# Patient Record
Sex: Male | Born: 1995 | Race: Black or African American | Hispanic: No | Marital: Single | Smoking: Heavy tobacco smoker
Health system: Southern US, Community
[De-identification: ages and names within clinical notes are randomized; demographics above are authoritative.]

## PROBLEM LIST (undated history)

## (undated) ENCOUNTER — Emergency Department (HOSPITAL_COMMUNITY): Admission: EM | Payer: Self-pay | Source: Home / Self Care

## (undated) DIAGNOSIS — R112 Nausea with vomiting, unspecified: Secondary | ICD-10-CM

## (undated) DIAGNOSIS — F19939 Other psychoactive substance use, unspecified with withdrawal, unspecified: Secondary | ICD-10-CM

## (undated) DIAGNOSIS — R569 Unspecified convulsions: Secondary | ICD-10-CM

## (undated) DIAGNOSIS — F1290 Cannabis use, unspecified, uncomplicated: Secondary | ICD-10-CM

## (undated) DIAGNOSIS — R5600 Simple febrile convulsions: Secondary | ICD-10-CM

## (undated) HISTORY — DX: Simple febrile convulsions: R56.00

---
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
## 2015-05-02 ENCOUNTER — Emergency Department (HOSPITAL_COMMUNITY)
Admission: EM | Admit: 2015-05-02 | Discharge: 2015-05-02 | Disposition: A | Discharge status: Home / Self Care | Payer: Medicaid Other | Attending: Emergency Medicine | Admitting: Emergency Medicine

## 2015-05-02 ENCOUNTER — Encounter (HOSPITAL_COMMUNITY): Payer: Self-pay | Admitting: Emergency Medicine

## 2015-05-02 DIAGNOSIS — R197 Diarrhea, unspecified: Secondary | ICD-10-CM | POA: Diagnosis not present

## 2015-05-02 DIAGNOSIS — Z720 Tobacco use: Secondary | ICD-10-CM | POA: Diagnosis not present

## 2015-05-02 DIAGNOSIS — Z79899 Other long term (current) drug therapy: Secondary | ICD-10-CM | POA: Insufficient documentation

## 2015-05-02 DIAGNOSIS — R112 Nausea with vomiting, unspecified: Secondary | ICD-10-CM

## 2015-05-02 LAB — CBC WITH DIFFERENTIAL/PLATELET
Basophils Absolute: 0 10*3/uL (ref 0.0–0.1)
Basophils Relative: 0 % (ref 0–1)
Eosinophils Absolute: 0 10*3/uL (ref 0.0–0.7)
Eosinophils Relative: 0 % (ref 0–5)
HCT: 46 % (ref 39.0–52.0)
Hemoglobin: 15.5 g/dL (ref 13.0–17.0)
Lymphocytes Relative: 13 % (ref 12–46)
Lymphs Abs: 1.5 10*3/uL (ref 0.7–4.0)
MCH: 31.3 pg (ref 26.0–34.0)
MCHC: 33.7 g/dL (ref 30.0–36.0)
MCV: 92.7 fL (ref 78.0–100.0)
Monocytes Absolute: 0.6 10*3/uL (ref 0.1–1.0)
Monocytes Relative: 5 % (ref 3–12)
Neutro Abs: 9 10*3/uL — ABNORMAL HIGH (ref 1.7–7.7)
Neutrophils Relative %: 82 % — ABNORMAL HIGH (ref 43–77)
Platelets: 292 10*3/uL (ref 150–400)
RBC: 4.96 MIL/uL (ref 4.22–5.81)
RDW: 13.4 % (ref 11.5–15.5)
WBC: 11.1 10*3/uL — ABNORMAL HIGH (ref 4.0–10.5)

## 2015-05-02 LAB — BASIC METABOLIC PANEL
Anion gap: 23 — ABNORMAL HIGH (ref 5–15)
BUN: 12 mg/dL (ref 6–20)
CO2: 15 mmol/L — ABNORMAL LOW (ref 22–32)
Calcium: 10.2 mg/dL (ref 8.9–10.3)
Chloride: 99 mmol/L — ABNORMAL LOW (ref 101–111)
Creatinine, Ser: 0.99 mg/dL (ref 0.61–1.24)
GFR calc Af Amer: >60 mL/min (ref >60)
GFR calc non Af Amer: >60 mL/min (ref >60)
Glucose, Bld: 97 mg/dL (ref 65–99)
Potassium: 3.9 mmol/L (ref 3.5–5.1)
Sodium: 137 mmol/L (ref 135–145)

## 2015-05-02 MED ORDER — MORPHINE SULFATE 4 MG/ML IJ SOLN
6.0000 mg | Freq: Once | INTRAMUSCULAR | Status: AC
  Administered 2015-05-02: 6 mg via INTRAVENOUS
  Filled 2015-05-02: qty 2

## 2015-05-02 MED ORDER — ONDANSETRON 8 MG PO TBDP
8.0000 mg | ORAL_TABLET | Freq: Once | ORAL | Status: AC
  Administered 2015-05-02: 8 mg via ORAL
  Filled 2015-05-02: qty 1

## 2015-05-02 MED ORDER — ONDANSETRON HCL 4 MG PO TABS
4.0000 mg | ORAL_TABLET | Freq: Four times a day (QID) | ORAL | Status: DC
Start: 2015-05-02 — End: 2021-08-28

## 2015-05-02 MED ORDER — KETOROLAC TROMETHAMINE 30 MG/ML IJ SOLN
15.0000 mg | Freq: Once | INTRAMUSCULAR | Status: AC
  Administered 2015-05-02: 15 mg via INTRAVENOUS
  Filled 2015-05-02: qty 1

## 2015-05-02 MED ORDER — ONDANSETRON HCL 4 MG/2ML IJ SOLN
4.0000 mg | Freq: Once | INTRAMUSCULAR | Status: AC
Start: 2015-05-02 — End: 2015-05-02
  Administered 2015-05-02: 4 mg via INTRAMUSCULAR
  Filled 2015-05-02: qty 2

## 2015-05-02 MED ORDER — SODIUM CHLORIDE 0.9 % IV BOLUS (SEPSIS)
1000.0000 mL | Freq: Once | INTRAVENOUS | Status: AC
  Administered 2015-05-02: 1000 mL via INTRAVENOUS

## 2015-05-02 NOTE — ED Notes (Signed)
PT c/o n/v/d starting this am with vomiting x10 and diarrhea x4. PT denies any recent antiobiotics and denies any abdominal pain.

## 2015-05-07 NOTE — ED Provider Notes (Signed)
CSN: [id number]     Arrival date & time 05/02/15  1540 History   First MD Initiated Contact with Patient 05/02/15 1706     Chief Complaint  Patient presents with  . Emesis     (Consider location/radiation/quality/duration/timing/severity/associated sxs/prior Treatment) HPI   18ym with n/v/d. Retching when he entered room. Symptoms began this morning. Multiple episodes of v/d.no blood. No fever or chills. Diffuse crampy abdominal pain. No sick contacts. Otherwise pretty healthy. No intervention prior to arrival.   Past Medical History  Diagnosis Date  . Febrile seizures     last at 48 y of age   History reviewed. No pertinent past surgical history. Family History  Problem Relation Age of Onset  . Diabetes Maternal Grandmother    History  Substance Use Topics  . Smoking status: Heavy Tobacco Smoker -- 0.50 packs/day    Types: Cigarettes  . Smokeless tobacco: Not on file  . Alcohol Use: Yes     Comment: last night    Review of Systems  All systems reviewed and negative, other than as noted in HPI.   Allergies  Amoxil  Home Medications   Prior to Admission medications   Medication Sig Start Date End Date Taking? Authorizing Provider  albuterol (PROVENTIL HFA;VENTOLIN HFA) 108 (90 BASE) MCG/ACT inhaler Inhale 2 puffs into the lungs every 6 (six) hours as needed for wheezing.   Yes Historical Provider, MD  loratadine (CLARITIN) 10 MG tablet Take 1 tablet (10 mg total) by mouth daily. Patient not taking: Reported on 05/02/2015 08/07/13   Laurell Josephs, MD  ondansetron (ZOFRAN) 4 MG tablet Take 1 tablet (4 mg total) by mouth every 6 (six) hours. 05/02/15   Raeford Razor, MD  terbinafine (LAMISIL) 250 MG tablet 1 tab PO QD for 12 weeks. Patient not taking: Reported on 05/02/2015 08/15/13   Laurell Josephs, MD   BP 121/85 mmHg  Pulse 88  Temp(Src) 98 F (36.7 C) (Oral)  Resp 20  SpO2 100% Physical Exam  Constitutional: He appears well-developed and well-nourished. No  distress.  HENT:  Head: Normocephalic and atraumatic.  Eyes: Conjunctivae are normal. Right eye exhibits no discharge. Left eye exhibits no discharge.  Neck: Neck supple.  Cardiovascular: Normal rate, regular rhythm and normal heart sounds.  Exam reveals no gallop and no friction rub.   No murmur heard. Pulmonary/Chest: Effort normal and breath sounds normal. No respiratory distress.  Abdominal: Soft. He exhibits no distension. There is no tenderness.  Musculoskeletal: He exhibits no edema or tenderness.  Neurological: He is alert.  Skin: Skin is warm and dry.  Psychiatric: He has a normal mood and affect. His behavior is normal. Thought content normal.  Nursing note and vitals reviewed.   ED Course  Procedures (including critical care time) Labs Review Labs Reviewed  CBC WITH DIFFERENTIAL/PLATELET - Abnormal; Notable for the following:    WBC 11.1 (*)    Neutrophils Relative % 82 (*)    Neutro Abs 9.0 (*)    All other components within normal limits  BASIC METABOLIC PANEL - Abnormal; Notable for the following:    Chloride 99 (*)    CO2 15 (*)    Anion gap 23 (*)    All other components within normal limits    Imaging Review No results found.   EKG Interpretation None      MDM   Final diagnoses:  Nausea vomiting and diarrhea    18yM with n/v/d. Treated symptomatically with much improvement. Now smiling  and drinking sprite. Suspect viral illness. It has been determined that no acute conditions requiring further emergency intervention are present at this time. The patient has been advised of the diagnosis and plan. I reviewed any labs and imaging including any potential incidental findings. We have discussed signs and symptoms that warrant return to the ED and they are listed in the discharge instructions.      Raeford Razor, MD 05/07/15 [phone]

## 2015-11-02 ENCOUNTER — Emergency Department (HOSPITAL_COMMUNITY)
Admission: EM | Admit: 2015-11-02 | Discharge: 2015-11-02 | Disposition: A | Discharge status: Home / Self Care | Payer: Medicaid Other | Attending: Emergency Medicine | Admitting: Emergency Medicine

## 2015-11-02 ENCOUNTER — Encounter (HOSPITAL_COMMUNITY): Payer: Self-pay | Admitting: Emergency Medicine

## 2015-11-02 DIAGNOSIS — J029 Acute pharyngitis, unspecified: Secondary | ICD-10-CM | POA: Insufficient documentation

## 2015-11-02 DIAGNOSIS — Z880 Allergy status to penicillin: Secondary | ICD-10-CM | POA: Insufficient documentation

## 2015-11-02 DIAGNOSIS — Z79899 Other long term (current) drug therapy: Secondary | ICD-10-CM | POA: Insufficient documentation

## 2015-11-02 DIAGNOSIS — J3489 Other specified disorders of nose and nasal sinuses: Secondary | ICD-10-CM | POA: Insufficient documentation

## 2015-11-02 DIAGNOSIS — F17210 Nicotine dependence, cigarettes, uncomplicated: Secondary | ICD-10-CM | POA: Diagnosis not present

## 2015-11-02 LAB — RAPID STREP SCREEN (MED CTR MEBANE ONLY): Streptococcus, Group A Screen (Direct): NEGATIVE

## 2015-11-02 NOTE — Discharge Instructions (Signed)
°Emergency Department Resource Guide °1) Find a Doctor and Pay Out of Pocket °Although you won't have to find out who is covered by your insurance plan, it is a good idea to ask around and get recommendations. You will then need to call the office and see if the doctor you have chosen will accept you as a new patient and what types of options they offer for patients who are self-pay. Some doctors offer discounts or will set up payment plans for their patients who do not have insurance, but you will need to ask so you aren't surprised when you get to your appointment. ° °2) Contact Your Local Health Department °Not all health departments have doctors that can see patients for sick visits, but many do, so it is worth a call to see if yours does. If you don't know where your local health department is, you can check in your phone book. The CDC also has a tool to help you locate your state's health department, and many state websites also have listings of all of their local health departments. ° °3) Find a Walk-in Clinic °If your illness is not likely to be very severe or complicated, you may want to try a walk in clinic. These are popping up all over the country in pharmacies, drugstores, and shopping centers. They're usually staffed by nurse practitioners or physician assistants that have been trained to treat common illnesses and complaints. They're usually fairly quick and inexpensive. However, if you have serious medical issues or chronic medical problems, these are probably not your best option. ° °No Primary Care Doctor: °- Call [facility] at  832-8000 - they can help you locate a primary care doctor that  accepts your insurance, provides certain services, etc. °- Physician Referral Service- 1-800-533-3463 ° °Chronic Pain Problems: °Organization         Address  Phone   Notes  °[facility]  (336) 297-2271 Patients need to be referred by their primary care doctor.  ° °Medication  Assistance: °Organization         Address  Phone   Notes  °[place] [facility] [address] °[place], [place] [address] (336) 641-8030 --Must be a resident of [place] °-- Must have NO insurance coverage whatsoever (no Medicaid/ Medicare, etc.) °-- The pt. MUST have a primary care doctor that directs their care regularly and follows them in the community °  °[facility]  (866) 331-1348   °[employer]  (888) 892-1162   ° °Agencies that provide inexpensive medical care: °Organization         Address  Phone   Notes  °[facility]  (336) 832-8035   °[facility]    (336) 832-7272   °[facility] [address] °[place], [place] [address] (336) 832-4777   °[facility] [address], °[place] (336) 271-4999   °[facility]    (336) 373-0678   °[facility]    (336) 272-1050   °[facility] ° [address], [place] Phone:  (336) 832-4444, Fax:  (336) 832-4440 Hours of Operation:  9 am - 6 pm, M-F.  Also accepts Medicaid/Medicare and self-pay.  °[facility] ° [address], [place] Phone: (336) 832-3150, Fax: (336) 832-3151. Hours of Operation:  8:30 am - 5:30 pm, M-F.  Also accepts Medicaid and self-pay.  °[facility] [place] [address], [place] Phone: (336) 878-6027   °[facility] [address], [place], [place] (336)723-1848, Ext. 123 Mondays & Thursdays: 7-9 AM.  First 15 patients are seen on a first come, first serve basis. °  ° °Medicaid-accepting [place] Providers: ° °Organization         Address  Phone   Notes  °[facility] [address], [place] (336) 641-2100 Also accepts self-pay patients.  °[facility] [address], [place] ° (336) 856-9996   °[facility] [address], [place]  (336) 288-8857   °[facility] [address] [place] [address], [place] (336) 299-7000   °Veita Bland [address], [place]  ° (336) 373-1557 Only accepts [place] Access Medicaid patients after they have their name applied to their card.  ° °Self-Pay (no insurance) in [place]: ° °Organization         Address  Phone   Notes  °Sickle Cell Patients, [facility] [address], [place] (336) 832-1970   °[facility] [address], [place] (336) 832-4400   °[facility] ° [address], [place] (336) 992-4800   °[facility]/Dr. Osei-Bonsu ° [address] [place] [address], [place] or [address], [place] (336) 841-8500 Phone number for both [place] and [place] locations is the same.  °[facility] [address], [place] (336) 299-0000   °[facility] [address] [place] [address], [place] or [address] (336) 852-7530 °(336) 878-2260   °[facility] [address], [place] (336) 350-1642, phone; (336) 294-5005, fax Sees patients 1st and 3rd Saturday of every month.  Must not qualify for public or private insurance (i.e. Medicaid, Medicare, [place] Health Choice, Veterans' Benefits) • Household income should be no more than 200% of the poverty level •The clinic cannot treat you if you are pregnant or think you are pregnant • Sexually transmitted diseases are not treated at the clinic.  ° ° °Dental Care: °Organization         Address  Phone  Notes  °[place] [facility] [address], [place] (336) 641-6152 Accepts children up to age 21 who are enrolled in Medicaid or [place] Health Choice; pregnant women with a Medicaid card; and children who have applied for Medicaid or [place] Health Choice, but were declined, whose parents can pay a reduced fee at time of service.  °[place]  [facility] [place]  [address], [place] (336) 641-7733 Accepts children up to age 21 who are enrolled in Medicaid or [place] Health Choice; pregnant women with a Medicaid card; and children who have applied for Medicaid or [place] Health Choice, but were declined, whose parents can pay a reduced fee at time of service.  °[facility] ° [address], [place] (336) 641-4533 Patients are seen by appointment only. Walk-ins are not accepted. [facility] will see patients 18 years of age and older. °Monday - Tuesday (8am-5pm) °Most Wednesdays (8:30-5pm) °$30 per visit, cash only  °[facility] ° [address], [place] (336) 641-4533 Patients are seen by appointment only. Walk-ins are not accepted. [facility] will see patients 18 years of age and older. °One   Wednesday Evening (Monthly: Volunteer Based).  $30 per visit, cash only  °[facility]  (919) 537-3737 for adults; Children under age 4, call Graduate Pediatric Dentistry at (919) [phone]. Children aged 4-14, please call (919) [phone] to request a pediatric application. ° Dental services are provided in all areas of dental care including fillings, crowns and bridges, complete and partial dentures, implants, gum treatment, root canals, and extractions. Preventive care is also provided. Treatment is provided to both adults and children. °Patients are selected via a lottery and there is often a waiting list. °  °[facility] [address], °[place] ° (336) 763-8833 [email or web address] °  °[facility] [address], [place], [place] (336)723-1848, Ext. 123 Second and Fourth Thursday of each month, opens at 6:30 AM; Clinic ends at 9 AM.  Patients are seen on a first-come first-served basis, and a limited number are seen during each clinic.  ° °[facility] ° [address], [place], [place] (336) 723-7904    Eligibility Requirements °You must have lived in [place], or [place] counties for at least the last three months. °  You cannot be eligible for state or federal sponsored healthcare insurance, including Veterans Administration, Medicaid, or Medicare. °  You generally cannot be eligible for healthcare insurance through your employer.  °  How to apply: °Eligibility screenings are held every Tuesday and Wednesday afternoon from 1:00 pm until 4:00 pm. You do not need an appointment for the interview!  °[facility] [address], [place], [place] 336-631-2330   °[facility]  336-342-8273   °[facility]  336-703-3100   °[facility]  336-570-6415   ° °Behavioral Health Resources in the Community: °Intensive Outpatient Programs °Organization         Address  Phone  Notes  °[facility] [address], [place], [place] 336-878-6098   °[facility] [address], [place], [place] 336-832-9800   °[facility] [address], [place], [place] ° 336-882-2125   °[facility] [address],  °[place], [place] 1-800-853-5163 or 336-641-4981   °Substance Abuse Resources °Organization         Address  Phone  Notes  °[facility]  336-882-2125   °[facility]  336-784-9470   °[facility]  336-285-9073   °[facility]  336-845-3988   °Residential & Outpatient Substance Abuse Program  1-800-659-3381   °Psychological Services °Organization         Address  Phone  Notes  °[facility]  336- 832-9600   °[facility]  336- 378-7881   °[facility] [address], [place] 1-800-853-5163 or 336-641-4981   ° °Mobile Crisis Teams °Organization         Address  Phone  Notes  °[facility], Mobile Crisis Care Unit  1-877-626-1772   °Assertive °[facility] ° [address]  [place], [place] 336-834-9664   °Sharon DeEsch [address] °[place] 336-554-5454   ° °Self-Help/Support Groups °Organization         Address  Phone             Notes  °[facility]  - variety of support groups  336- 373-1402 Call for more information  °Narcotics Anonymous (NA), [facility] [address], °[place]  2 meetings at this location  ° °  Residential Treatment Programs °Organization         Address  Phone  Notes  °[facility] [address],    °[place]  1-866-801-8205   °[facility] ° [address], [place], [place] 704-293-8524   °[facility] [address], [place] 336-845-3988 Admissions: 8am-3pm M-F  °[facility] [address]    °[place], [place] 336-841-1104   °[facility] [address], [place], [place] 336-379-7146   °[facility] [address]  °[place], [place] 336-285-9073   °[facility] - Intensive Outpatient [address], [place], [place] 336-852-3033   °[facility] [address]  °[place], [place] 1-877-615-2722 or 336-784-9470   °[facility] [address] [place], [place] 336-227-7417 Accepts Medicaid  °[facility] [address]  °[place] 1-800-659-3381 Substance Abuse/Addiction Treatment  ° °[place] Behavioral Health Resources °Organization         Address  Phone  Notes  °[facility]  (888) 581-9988   °Julie Brannon, PhD [address] [place], [place]   (336) 349-5553 or (336) 951-0000   °[facility]   [address] °[place], [place] (336) 349-4454   °[facility] [address], [place], [place] (336) 342-8316 Insurance/Medicaid/sponsorship through [facility]  °[facility] [address]                                    [place], [place] (336) 342-8316 Therapy/tele-psych/case    °[facility] [address]  ° [place], [place] (336) 349-2233    °Dr. Arfeen  (336) 349-4544   °[facility] [place]  [employer] [place] [facility] 1) [address], [place] °2) [address], [place] °3)  [address], [place] (336) 349-3220 °(336) 342-7768 ° °(336) 342-8140   °[place] [facility] (336) 342-1394 or (336) 342-3537 (After Hours)    ° ° °Take over the counter tylenol and ibuprofen, as directed on packaging, as needed for discomfort.  Gargle with warm water several times per day to help with discomfort.  May also use over the counter sore throat pain medicines such as chloraseptic or sucrets, as directed on packaging, as needed for discomfort.  Call your regular medical doctor today to schedule a follow up appointment this week.  Return to the Emergency Department immediately if worsening. ° °

## 2015-11-02 NOTE — ED Provider Notes (Signed)
CSN: [id number]     Arrival date & time 11/02/15  0844 History   First MD Initiated Contact with Patient 11/02/15 [phone]     Chief Complaint  Patient presents with  . Sore Throat     HPI Pt was seen at 0855. Per pt, c/o gradual onset and persistence of constant sore throat that began when he woke up today. Denies fevers, no cough, no CP/SOB, no abd pain, no N/V/D, no rash.    Past Medical History  Diagnosis Date  . 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 No pertinent past surgical history.   Family History  Problem Relation Age of [place]  . Diabetes Maternal Grandmother    Social History  Substance Use Topics  . Smoking status: Heavy Tobacco Smoker -- 1.00 packs/day    Types: Cigarettes  . Smokeless tobacco: None  . Alcohol Use: Yes     Comment: last night-daily use    Review of Systems ROS: Statement: All systems negative except as marked or noted in the HPI; Constitutional: Negative for fever and chills. ; ; Eyes: Negative for eye pain, redness and discharge. ; ; ENMT: Negative for ear pain, hoarseness, nasal congestion, sinus pressure and +sore throat. ; ; Cardiovascular: Negative for chest pain, palpitations, diaphoresis, dyspnea and peripheral edema. ; ; Respiratory: Negative for cough, wheezing and stridor. ; ; Gastrointestinal: Negative for nausea, vomiting, diarrhea, abdominal pain, blood in stool, hematemesis, jaundice and rectal bleeding. . ; ; Genitourinary: Negative for dysuria, flank pain and hematuria. ; ; Musculoskeletal: Negative for back pain and neck pain. Negative for swelling and trauma.; ; Skin: Negative for pruritus, rash, abrasions, blisters, bruising and skin lesion.; ; Neuro: Negative for headache, lightheadedness and neck stiffness. Negative for weakness, altered level of consciousness , altered mental status, extremity weakness, paresthesias, involuntary movement, seizure and syncope.      Allergies  Amoxil  Home Medications    Prior to Admission medications   Medication Sig Start Date End Date Taking? Authorizing Provider  albuterol (PROVENTIL HFA;VENTOLIN HFA) 108 (90 BASE) MCG/ACT inhaler Inhale 2 puffs into the lungs every 6 (six) hours as needed for wheezing.    Historical Provider, MD  loratadine (CLARITIN) 10 MG tablet Take 1 tablet (10 mg total) by mouth daily. Patient not taking: Reported on 05/02/2015 08/07/13   Laurell Josephsalia A Khalifa, MD  ondansetron (ZOFRAN) 4 MG tablet Take 1 tablet (4 mg total) by mouth every 6 (six) hours. 05/02/15   Raeford RazorStephen Kohut, MD  terbinafine (LAMISIL) 250 MG tablet 1 tab PO QD for 12 weeks. Patient not taking: Reported on 05/02/2015 08/15/13   Laurell Josephsalia A Khalifa, MD   BP 121/107 mmHg  Pulse 99  Temp(Src) 99.1 F (37.3 C) (Oral)  Resp 18  Ht 5\' 5"  (1.651 m)  Wt 120 lb (54.432 kg)  BMI 19.97 kg/m2  SpO2 100% Physical Exam  0900: Physical examination:  Nursing notes reviewed; Vital signs and O2 SAT reviewed;  Constitutional: Well developed, Well nourished, Well hydrated, In no acute distress; Head:  Normocephalic, atraumatic; Eyes: EOMI, PERRL, No scleral icterus; ENMT: TM's clear bilat. +edemetous nasal turbinates bilat with clear rhinorrhea. Mouth and pharynx without lesions. No tonsillar exudates. No intra-oral edema. No submandibular or sublingual edema. No hoarse voice, no drooling, no stridor. No pain with manipulation of larynx. No trismus. Mouth and pharynx normal, Mucous membranes moist; Neck: Supple, Full range of motion, No lymphadenopathy; Cardiovascular: Regular rate and rhythm, No  murmur, rub, or gallop; Respiratory: Breath sounds clear & equal bilaterally, No rales, rhonchi, wheezes.  Speaking full sentences with ease, Normal respiratory effort/excursion; Chest: Nontender, Movement normal; Abdomen: Soft, Nontender, Nondistended, Normal bowel sounds; Genitourinary: No CVA tenderness; Extremities: Pulses normal, No tenderness, No edema, No calf edema or asymmetry.; Neuro: AA&Ox3,  Major CN grossly intact.  Speech clear. No gross focal motor or sensory deficits in extremities.; Skin: Color normal, Warm, Dry.   ED Course  Procedures (including critical care time) Labs Review   Imaging Review  I have personally reviewed and evaluated these images and lab results as part of my medical decision-making.   EKG Interpretation None      MDM  MDM Reviewed: previous chart, nursing note and vitals      Results for orders placed or performed during the hospital encounter of 11/02/15  Rapid strep screen  Result Value Ref Range   Streptococcus, Group A Screen (Direct) NEGATIVE NEGATIVE    0940:  Strep negative. Tx symptomatically at this time. Dx and testing d/w pt.  Questions answered.  Verb understanding, agreeable to d/c home with outpt f/u.     Samuel Jester, DO 11/05/15 2348

## 2015-11-02 NOTE — ED Notes (Signed)
Pt states that he woke up this morning with congestion and a sore throat.

## 2015-11-05 LAB — CULTURE, GROUP A STREP: STREP A CULTURE: NEGATIVE

## 2016-03-18 ENCOUNTER — Emergency Department (HOSPITAL_COMMUNITY)
Admission: EM | Admit: 2016-03-18 | Discharge: 2016-03-18 | Disposition: A | Discharge status: Home / Self Care | Payer: Medicaid Other | Attending: Emergency Medicine | Admitting: Emergency Medicine

## 2016-03-18 ENCOUNTER — Encounter (HOSPITAL_COMMUNITY): Payer: Self-pay | Admitting: Emergency Medicine

## 2016-03-18 DIAGNOSIS — F17210 Nicotine dependence, cigarettes, uncomplicated: Secondary | ICD-10-CM | POA: Diagnosis not present

## 2016-03-18 DIAGNOSIS — H00036 Abscess of eyelid left eye, unspecified eyelid: Secondary | ICD-10-CM | POA: Diagnosis not present

## 2016-03-18 DIAGNOSIS — L0201 Cutaneous abscess of face: Secondary | ICD-10-CM

## 2016-03-18 MED ORDER — LIDOCAINE-EPINEPHRINE (PF) 2 %-1:200000 IJ SOLN
10.0000 mL | Freq: Once | INTRAMUSCULAR | Status: AC
  Administered 2016-03-18: 10 mL
  Filled 2016-03-18: qty 20

## 2016-03-18 MED ORDER — DOXYCYCLINE HYCLATE 100 MG PO CAPS: 100.0000 mg | ORAL_CAPSULE | Freq: Two times a day (BID) | ORAL | Status: DC

## 2016-03-18 MED ORDER — POVIDONE-IODINE 10 % EX SOLN
CUTANEOUS | Status: AC
  Filled 2016-03-18: qty 118

## 2016-03-18 NOTE — ED Notes (Signed)
Patient with no complaints at this time. Respirations even and unlabored. Skin warm/dry. Discharge instructions reviewed with patient at this time. Patient given opportunity to voice concerns/ask questions. Patient discharged at this time and left Emergency Department with steady gait.   

## 2016-03-18 NOTE — Discharge Instructions (Signed)
Please cleanse the wound daily with soap and water, and apply a Band-Aid. Please use doxycycline 2 times daily with food. May use Tylenol or ibuprofen for soreness if needed. Incision and Drainage Incision and drainage is a procedure in which a sac-like structure (cystic structure) is opened and drained. The area to be drained usually contains material such as pus, fluid, or blood.  LET YOUR CAREGIVER KNOW ABOUT:   Allergies to medicine.  Medicines taken, including vitamins, herbs, eyedrops, over-the-counter medicines, and creams.  Use of steroids (by mouth or creams).  Previous problems with anesthetics or numbing medicines.  History of bleeding problems or blood clots.  Previous surgery.  Other health problems, including diabetes and kidney problems.  Possibility of pregnancy, if this applies. RISKS AND COMPLICATIONS  Pain.  Bleeding.  Scarring.  Infection. BEFORE THE PROCEDURE  You may need to have an ultrasound or other imaging tests to see how large or deep your cystic structure is. Blood tests may also be used to determine if you have an infection or how severe the infection is. You may need to have a tetanus shot. PROCEDURE  The affected area is cleaned with a cleaning fluid. The cyst area will then be numbed with a medicine (local anesthetic). A small incision will be made in the cystic structure. A syringe or catheter may be used to drain the contents of the cystic structure, or the contents may be squeezed out. The area will then be flushed with a cleansing solution. After cleansing the area, it is often gently packed with a gauze or another wound dressing. Once it is packed, it will be covered with gauze and tape or some other type of wound dressing. AFTER THE PROCEDURE   Often, you will be allowed to go home right after the procedure.  You may be given antibiotic medicine to prevent or heal an infection.  If the area was packed with gauze or some other wound  dressing, you will likely need to come back in 1 to 2 days to get it removed.  The area should heal in about 14 days.   This information is not intended to replace advice given to you by your health care provider. Make sure you discuss any questions you have with your health care provider.   Document Released: 05/02/2001 Document Revised: 05/07/2012 Document Reviewed: 01/01/2012 Elsevier Interactive Patient Education [employer]2016 Elsevier Inc.

## 2016-03-18 NOTE — ED Notes (Signed)
Patient c/o abscess to left eyelid. Oribital swelling noted. Per patient appeared on Monday and has increases in size and pain. Denies any blurred vision, drainage, or fevers.

## 2016-03-18 NOTE — ED Provider Notes (Signed)
CSN: [id number]     Arrival date & time 03/18/16  1159 History   First MD Initiated Contact with Patient 03/18/16 1218     Chief Complaint  Patient presents with  . Abscess     (Consider location/radiation/quality/duration/timing/severity/associated sxs/prior Treatment) Patient is a 20 y.o. male presenting with abscess. The history is provided by the patient and a parent.  Abscess Location:  Face Facial abscess location:  L eyebrow Abscess quality: fluctuance, painful and redness   Red streaking: no   Duration:  6 days Progression:  Worsening Pain details:    Quality:  Aching   Severity:  Moderate   Duration:  6 days   Timing:  Intermittent   Progression:  Worsening Chronicity:  New Context: not diabetes   Relieved by:  Nothing Worsened by:  Draining/squeezing Ineffective treatments:  Warm compresses Associated symptoms: no fatigue, no fever, no nausea and no vomiting   Risk factors: no hx of MRSA     Past Medical History  Diagnosis Date  . Febrile seizures (HCC)     last at 89 y of age   History reviewed. No pertinent past surgical history. Family History  Problem Relation Age of Onset  . Diabetes Maternal Grandmother    Social History  Substance Use Topics  . Smoking status: Heavy Tobacco Smoker -- 1.00 packs/day    Types: Cigarettes  . Smokeless tobacco: Never Used  . Alcohol Use: Yes    Review of Systems  Constitutional: Negative for fever, chills, activity change, appetite change and fatigue.  Gastrointestinal: Negative for nausea and vomiting.  Skin: Positive for wound.  All other systems reviewed and are negative.     Allergies  Amoxil  Home Medications   Prior to Admission medications   Medication Sig Start Date End Date Taking? Authorizing Provider  albuterol (PROVENTIL HFA;VENTOLIN HFA) 108 (90 BASE) MCG/ACT inhaler Inhale 2 puffs into the lungs every 6 (six) hours as needed for wheezing.    Historical Provider, MD  loratadine (CLARITIN) 10  MG tablet Take 1 tablet (10 mg total) by mouth daily. Patient not taking: Reported on 05/02/2015 08/07/13   Laurell Josephsalia A Khalifa, MD  ondansetron (ZOFRAN) 4 MG tablet Take 1 tablet (4 mg total) by mouth every 6 (six) hours. 05/02/15   Raeford RazorStephen Kohut, MD  terbinafine (LAMISIL) 250 MG tablet 1 tab PO QD for 12 weeks. Patient not taking: Reported on 05/02/2015 08/15/13   Laurell Josephsalia A Khalifa, MD   BP 123/85 mmHg  Pulse 92  Temp(Src) 97.9 F (36.6 C) (Oral)  Resp 18  Ht 5\' 3"  (1.6 m)  Wt 58.968 kg  BMI 23.03 kg/m2  SpO2 100% Physical Exam  Constitutional: He is oriented to person, place, and time. He appears well-developed and well-nourished.  Non-toxic appearance.  HENT:  Head: Normocephalic.    Right Ear: Tympanic membrane and external ear normal.  Left Ear: Tympanic membrane and external ear normal.  Abscess left eye brow EOM intact bilat. Anterior chamber clear. No periorbital tenderness.  Eyes: EOM and lids are normal. Pupils are equal, round, and reactive to light.  Neck: Normal range of motion. Neck supple. Carotid bruit is not present.  Cardiovascular: Normal rate, regular rhythm, normal heart sounds, intact distal pulses and normal pulses.   Pulmonary/Chest: Breath sounds normal. No respiratory distress.  Abdominal: Soft. Bowel sounds are normal. There is no tenderness. There is no guarding.  Musculoskeletal: Normal range of motion.  Lymphadenopathy:       Head (right side): No submandibular  adenopathy present.       Head (left side): No submandibular adenopathy present.    He has no cervical adenopathy.  Neurological: He is alert and oriented to person, place, and time. He has normal strength. No cranial nerve deficit or sensory deficit.  Skin: Skin is warm and dry.  Psychiatric: He has a normal mood and affect. His speech is normal.  Nursing note and vitals reviewed.   ED Course  .Marland KitchenIncision and Drainage Date/Time: 03/18/2016 1:18 PM Performed by: Ivery Quale Authorized by:  Ivery Quale Consent: Verbal consent obtained. Risks and benefits: risks, benefits and alternatives were discussed Consent given by: patient and parent Patient understanding: patient states understanding of the procedure being performed Patient identity confirmed: arm band Time out: Immediately prior to procedure a "time out" was called to verify the correct patient, procedure, equipment, support staff and site/side marked as required. Type: abscess Body area: head (left eye brow) Anesthesia: local infiltration Local anesthetic: lidocaine 1% with epinephrine Patient sedated: no Needle gauge: 18 Incision depth: dermal Complexity: simple Drainage: purulent Drainage amount: moderate Patient tolerance: Patient tolerated the procedure well with no immediate complications   (including critical care time) Labs Review Labs Reviewed  CULTURE, ROUTINE-ABSCESS    Imaging Review No results found. I have personally reviewed and evaluated these images and lab results as part of my medical decision-making.   EKG Interpretation None      MDM  Vital signs stable. I and d carried out for abscess of the left eye brow. Pt covered with doxycyline. Pt will return if not improving. We discussed the possibllity of scaring, even with using needle instead of scalpel for aspiration or incision and drainage.    Final diagnoses:  Abscess, eyebrow    **I have reviewed nursing notes, vital signs, and all appropriate lab and imaging results for this patient.Ivery Quale, PA-C 03/18/16 1346  Eber Hong, MD 03/19/16 1536

## 2016-03-20 LAB — CULTURE, ROUTINE-ABSCESS

## 2016-08-13 ENCOUNTER — Emergency Department (HOSPITAL_COMMUNITY)
Admission: EM | Admit: 2016-08-13 | Discharge: 2016-08-13 | Disposition: A | Discharge status: Home / Self Care | Payer: Medicaid Other | Attending: Emergency Medicine | Admitting: Emergency Medicine

## 2016-08-13 ENCOUNTER — Encounter (HOSPITAL_COMMUNITY): Payer: Self-pay | Admitting: *Deleted

## 2016-08-13 DIAGNOSIS — F17210 Nicotine dependence, cigarettes, uncomplicated: Secondary | ICD-10-CM | POA: Diagnosis not present

## 2016-08-13 DIAGNOSIS — R350 Frequency of micturition: Secondary | ICD-10-CM | POA: Insufficient documentation

## 2016-08-13 LAB — URINALYSIS, ROUTINE W REFLEX MICROSCOPIC
Bilirubin Urine: NEGATIVE
GLUCOSE, UA: NEGATIVE mg/dL
HGB URINE DIPSTICK: NEGATIVE
Leukocytes, UA: NEGATIVE
Nitrite: NEGATIVE
PH: 6 (ref 5.0–8.0)
PROTEIN: NEGATIVE mg/dL
Specific Gravity, Urine: 1.025 (ref 1.005–1.030)

## 2016-08-13 NOTE — ED Triage Notes (Signed)
Pt c/o increase in urination that started 20 minutes ago,

## 2016-08-13 NOTE — ED Provider Notes (Signed)
[facility]PT Provider Note   CSN: [id number] Arrival date & time: 08/13/16  0359     History   Chief Complaint Chief Complaint  Patient presents with  . Urinary Frequency    HPI Timothy [name] is a 20 y.o. male. He has no significant past medical history. He comes in complaining of urinary frequency this evening. History is rather vague. Initially, he told me that he had onset 20 minutes ago of increased frequency of urination, then stated that it started during the night. He states he felt fine during the day. He denies urinary urgency or tenesmus or dysuria. Denies any urethral discharge. There is no known family history of diabetes. Denies any generalized weakness. Denies nausea or vomiting. He denies fever or chills.  The history is provided by the patient.    Past Medical History:  Diagnosis Date  . Febrile seizures (HCC)    last at 2 y of age    There are no active problems to display for this patient.   History reviewed. No pertinent surgical history.     Home Medications    Prior to Admission medications   Medication Sig Start Date End Date Taking? Authorizing Provider  albuterol (PROVENTIL HFA;VENTOLIN HFA) 108 (90 BASE) MCG/ACT inhaler Inhale 2 puffs into the lungs every 6 (six) hours as needed for wheezing.    Historical Provider, MD  doxycycline (VIBRAMYCIN) 100 MG capsule Take 1 capsule (100 mg total) by mouth 2 (two) times daily. 03/18/16   Ivery QualeHobson Bryant, PA-C  loratadine (CLARITIN) 10 MG tablet Take 1 tablet (10 mg total) by mouth daily. Patient not taking: Reported on 05/02/2015 08/07/13   Laurell Josephsalia A Khalifa, MD  ondansetron (ZOFRAN) 4 MG tablet Take 1 tablet (4 mg total) by mouth every 6 (six) hours. 05/02/15   Raeford RazorStephen Kohut, MD  terbinafine (LAMISIL) 250 MG tablet 1 tab PO QD for 12 weeks. Patient not taking: Reported on 05/02/2015 08/15/13   Laurell Josephsalia A Khalifa, MD    Family History Family History  Problem Relation Age of Onset  . Diabetes Maternal  Grandmother     Social History Social History  Substance Use Topics  . Smoking status: Heavy Tobacco Smoker    Packs/day: 1.00    Types: Cigarettes  . Smokeless tobacco: Never Used  . Alcohol use Yes     Allergies   Amoxil [amoxicillin]   Review of Systems Review of Systems  All other systems reviewed and are negative.    Physical Exam Updated Vital Signs BP 141/85   Pulse 82   Temp 98 F (36.7 C) (Oral)   Resp 16   SpO2 100%   Physical Exam  Nursing note and vitals reviewed.  20 year old male, resting comfortably and in no acute distress. Vital signs are significant for borderline hypertension. Oxygen saturation is 100%, which is normal. Head is normocephalic and atraumatic. PERRLA, EOMI. Oropharynx is clear. Neck is nontender and supple without adenopathy or JVD. Back is nontender and there is no CVA tenderness. Lungs are clear without rales, wheezes, or rhonchi. Chest is nontender. Heart has regular rate and rhythm without murmur. Abdomen is soft, flat, nontender without masses or hepatosplenomegaly and peristalsis is normoactive. Extremities have no cyanosis or edema, full range of motion is present. Skin is warm and dry without rash. Neurologic: Mental status is normal, cranial nerves are intact, there are no motor or sensory deficits.  ED Treatments / Results  Labs (all labs ordered are listed, but only abnormal results  are displayed) Labs Reviewed  URINALYSIS, ROUTINE W REFLEX MICROSCOPIC (NOT AT [facility]) - Abnormal; Notable for the following:       Result Value   Ketones, ur TRACE (*)    All other components within normal limits   Procedures Procedures (including critical care time)  Medications Ordered in ED Medications - No data to display   Initial Impression / Assessment and Plan / ED Course  I have reviewed the triage vital signs and the nursing notes.  Pertinent labs & imaging results that were available during my care of the patient were  reviewed by me and considered in my medical decision making (see chart for details).  Clinical Course   Report of urinary frequency. Urinalysis obese sent to evaluate for possible UTI, possible glycosuria. Old records are reviewed, and he has no relevant past visits.  Urinalysis is normal except for presence of small amount of ketones. No evidence of UTI or diabetes. I suspect that he had increased fluid intake to account for his polyuria although he does deny this. He is sad discharged with instructions to return should symptoms worsen.  Final Clinical Impressions(s) / ED Diagnoses   Final diagnoses:  Urinary frequency    New Prescriptions New Prescriptions   No medications on file     Dione Booze, MD 08/13/16 [phone]

## 2016-12-31 ENCOUNTER — Emergency Department (HOSPITAL_COMMUNITY)
Admission: EM | Admit: 2016-12-31 | Discharge: 2017-01-01 | Disposition: A | Discharge status: Home / Self Care | Payer: Medicaid Other | Attending: Emergency Medicine | Admitting: Emergency Medicine

## 2016-12-31 ENCOUNTER — Encounter (HOSPITAL_COMMUNITY): Payer: Self-pay | Admitting: Emergency Medicine

## 2016-12-31 DIAGNOSIS — R0789 Other chest pain: Secondary | ICD-10-CM

## 2016-12-31 DIAGNOSIS — J069 Acute upper respiratory infection, unspecified: Secondary | ICD-10-CM | POA: Diagnosis not present

## 2016-12-31 DIAGNOSIS — R112 Nausea with vomiting, unspecified: Secondary | ICD-10-CM | POA: Diagnosis not present

## 2016-12-31 DIAGNOSIS — F17210 Nicotine dependence, cigarettes, uncomplicated: Secondary | ICD-10-CM | POA: Insufficient documentation

## 2016-12-31 NOTE — ED Triage Notes (Signed)
Pt states he has been vomiting since 1 am and hasn't been able to keep anything down. States he thinks he "drank too much last night". Pt also c/o intermittent centralized chest pain that he describes as "aching".

## 2017-01-01 ENCOUNTER — Emergency Department (HOSPITAL_COMMUNITY): Payer: Medicaid Other

## 2017-01-01 LAB — CBC WITH DIFFERENTIAL/PLATELET
BASOS PCT: 1 %
Basophils Absolute: 0.1 10*3/uL (ref 0.0–0.1)
EOS ABS: 0.2 10*3/uL (ref 0.0–0.7)
Eosinophils Relative: 4 %
HCT: 45.1 % (ref 39.0–52.0)
Hemoglobin: 15.4 g/dL (ref 13.0–17.0)
Lymphocytes Relative: 22 %
Lymphs Abs: 1.2 10*3/uL (ref 0.7–4.0)
MCH: 31.9 pg (ref 26.0–34.0)
MCHC: 34.1 g/dL (ref 30.0–36.0)
MCV: 93.4 fL (ref 78.0–100.0)
MONOS PCT: 20 %
Monocytes Absolute: 1.1 10*3/uL — ABNORMAL HIGH (ref 0.1–1.0)
NEUTROS PCT: 53 %
Neutro Abs: 2.8 10*3/uL (ref 1.7–7.7)
Platelets: 285 10*3/uL (ref 150–400)
RBC: 4.83 MIL/uL (ref 4.22–5.81)
RDW: 13.4 % (ref 11.5–15.5)
WBC: 5.3 10*3/uL (ref 4.0–10.5)

## 2017-01-01 LAB — COMPREHENSIVE METABOLIC PANEL
ALBUMIN: 4.6 g/dL (ref 3.5–5.0)
ALK PHOS: 107 U/L (ref 38–126)
ALT: 26 U/L (ref 17–63)
ANION GAP: 8 (ref 5–15)
AST: 32 U/L (ref 15–41)
BUN: 7 mg/dL (ref 6–20)
CO2: 27 mmol/L (ref 22–32)
Calcium: 9.5 mg/dL (ref 8.9–10.3)
Chloride: 98 mmol/L — ABNORMAL LOW (ref 101–111)
Creatinine, Ser: 0.79 mg/dL (ref 0.61–1.24)
GFR calc Af Amer: >60 mL/min (ref >60)
GFR calc non Af Amer: >60 mL/min (ref >60)
GLUCOSE: 87 mg/dL (ref 65–99)
POTASSIUM: 3.7 mmol/L (ref 3.5–5.1)
Sodium: 133 mmol/L — ABNORMAL LOW (ref 135–145)
Total Bilirubin: 2.2 mg/dL — ABNORMAL HIGH (ref 0.3–1.2)
Total Protein: 7.5 g/dL (ref 6.5–8.1)

## 2017-01-01 LAB — LIPASE, BLOOD: Lipase: <10 U/L — ABNORMAL LOW (ref 11–51)

## 2017-01-01 MED ORDER — ONDANSETRON HCL 4 MG/2ML IJ SOLN
4.0000 mg | Freq: Once | INTRAMUSCULAR | Status: AC
  Administered 2017-01-01: 4 mg via INTRAVENOUS
  Filled 2017-01-01: qty 2

## 2017-01-01 MED ORDER — ONDANSETRON 4 MG PO TBDP: 4.0000 mg | ORAL_TABLET | Freq: Three times a day (TID) | ORAL | 0 refills | Status: DC | PRN

## 2017-01-01 MED ORDER — KETOROLAC TROMETHAMINE 30 MG/ML IJ SOLN
30.0000 mg | Freq: Once | INTRAMUSCULAR | Status: AC
  Administered 2017-01-01: 30 mg via INTRAVENOUS
  Filled 2017-01-01: qty 1

## 2017-01-01 MED ORDER — SODIUM CHLORIDE 0.9 % IV BOLUS (SEPSIS)
1000.0000 mL | Freq: Once | INTRAVENOUS | Status: AC
  Administered 2017-01-01: 1000 mL via INTRAVENOUS

## 2017-01-01 NOTE — ED Notes (Signed)
Family member at bedside given ginger ale per request,

## 2017-01-01 NOTE — ED Notes (Signed)
Pt returned from xray,  

## 2017-01-01 NOTE — Discharge Instructions (Signed)
Your labs, chest x-ray today were normal. You may alternate between Tylenol 1000 mg every 6 hours as needed for fever and pain and ibuprofen 800 mg every 8 hours as needed for fever and pain. Please drink plenty of water and a bland diet for the next several days. If you began having diarrhea, you may take Imodium over-the-counter. If you began having severe abdominal pain, feel short of breath, vomiting and cannot stop, blood in your stool, please return to the emergency department.

## 2017-01-01 NOTE — ED Notes (Signed)
Pt tolerated po fluids well,  

## 2017-01-01 NOTE — ED Notes (Signed)
ED Provider at bedside. 

## 2017-01-01 NOTE — ED Notes (Signed)
Pt c/o cough for the past 4 days, non productive, emesis today since drinking etoh last night,

## 2017-01-01 NOTE — ED Notes (Signed)
Pt given ginger ale and crackers per request, update given,

## 2017-01-01 NOTE — ED Provider Notes (Signed)
By signing my name below, I, Clarisse Gouge, attest that this documentation has been prepared under the direction and in the presence of Laina Guerrieri N Dahlia Nifong, DO. Electronically signed, Clarisse Gouge, ED Scribe. 01/01/17. 12:56 AM.   TIME SEEN: 0018  CHIEF COMPLAINT: vomiting and chest pain  HPI:  Timothy [name] is a 21 y.o. male who presents to the Emergency Department complaining of vomiting since ~1300 on 12/31/2016. He also notes pressure like chest pain x ~2-3 hours that is present with a dry cough that he has had for "days", and mild diffuse headache. He adds Hx of bronchitis. Pt notes he is a smoker. ETOH use noted the night prior to onset of vomiting. Pt denies diarrhea, fever, chills, abdominal pain, sick contacts, dysuria, hematuria and testical pain or swelling.  No history of ACS.  No history of PE, DVT, exogenous estrogen use, recent fractures, surgery, trauma, hospitalization or prolonged travel. No lower extremity swelling or pain. No calf tenderness.  ROS: See HPI Constitutional: no fever  Eyes: no drainage  ENT: no runny nose   Cardiovascular:   chest pain with coughing Resp: no SOB; dry cough  GI:  Vomiting; no diarrhea GU: no dysuria Integumentary: no rash  Allergy: no hives  Musculoskeletal: no leg swelling  Neurological: no slurred speech ROS otherwise negative  PAST MEDICAL HISTORY/PAST SURGICAL HISTORY:  Past Medical History:  Diagnosis Date  . Febrile seizures (HCC)    last at 61 y of age    MEDICATIONS:  Prior to Admission medications   Medication Sig Start Date End Date Taking? Authorizing Provider  albuterol (PROVENTIL HFA;VENTOLIN HFA) 108 (90 BASE) MCG/ACT inhaler Inhale 2 puffs into the lungs every 6 (six) hours as needed for wheezing.    Historical Provider, MD  doxycycline (VIBRAMYCIN) 100 MG capsule Take 1 capsule (100 mg total) by mouth 2 (two) times daily. 03/18/16   Ivery Quale, PA-C  ondansetron (ZOFRAN) 4 MG tablet Take 1 tablet (4 mg total) by mouth  every 6 (six) hours. 05/02/15   Raeford Razor, MD    ALLERGIES:  Allergies  Allergen Reactions  . Amoxil [Amoxicillin] Rash    SOCIAL HISTORY:  Social History  Substance Use Topics  . Smoking status: Heavy Tobacco Smoker    Packs/day: 1.00    Types: Cigarettes  . Smokeless tobacco: Never Used  . Alcohol use Yes    FAMILY HISTORY: Family History  Problem Relation Age of Onset  . Diabetes Maternal Grandmother     EXAM: BP 128/85 (BP Location: Left Arm)   Pulse 95   Temp 98.8 F (37.1 C) (Oral)   Resp 16   Ht 5\' 4"  (1.626 m)   Wt 140 lb (63.5 kg)   SpO2 98%   BMI 24.03 kg/m  CONSTITUTIONAL: Alert and oriented and responds appropriately to questions. Well-appearing; well-nourished HEAD: Normocephalic EYES: Conjunctivae clear, PERRL, EOMI ENT: normal nose; no rhinorrhea; moist mucous membranes NECK: Supple, no meningismus, no nuchal rigidity, no LAD  CARD: RRR; S1 and S2 appreciated; no murmurs, no clicks, no rubs, no gallops,  CHEST:  chest wall TTP over the anterior chest and without ecchymosis, crepitus or deformity RESP: Normal chest excursion without splinting or tachypnea; breath sounds clear and equal bilaterally; no wheezes, no rhonchi, no rales, no hypoxia or respiratory distress, speaking full sentences ABD/GI: Normal bowel sounds; non-distended; soft, non-tender, no rebound, no guarding, no peritoneal signs, no hepatosplenomegaly BACK:  The back appears normal and is non-tender to palpation, there is no CVA  tenderness EXT: Normal ROM in all joints; non-tender to palpation; no edema; normal capillary refill; no cyanosis, no calf tenderness or swelling    SKIN: Normal color for age and race; warm; no rash NEURO: Moves all extremities equally, sensation to light touch intact diffusely, cranial nerves II through XII intact, normal speech PSYCH: The patient's mood and manner are appropriate. Grooming and personal hygiene are appropriate.  MEDICAL DECISION MAKING:  Patient here with complaints of upper respiratory infectious symptoms for the past several days with what sounds like chest wall pain. Doubt PE, dissection or ACS. EKG shows no ischemic abnormality. We'll obtain chest x-ray but doubt pneumonia. We'll give Toradol for pain and reassess.  Also complaining of nausea vomiting after drinking alcohol last night. Abdominal exam is benign. Will obtain abdominal labs. No urinary symptoms. Doubt bowel dissection, colitis, diverticulitis, appendicitis. Low suspicion for cholecystitis, cholelithiasis or pancreatitis given her benign exam.  ED PROGRESS: Patient's workup is unremarkable. Labs showed mild elevation of his total bilirubin but otherwise LFTs and lipase is normal and abdominal exam is still benign. Chest x-ray is clear. He reports feeling much better and is a crackers, drinking fluids without difficulty. Will discharge with prescriptions for Zofran. Recommended alternating Tylenol and Motrin for pain. Discussed return precautions. He is comfortable with this plan.   At this time, I do not feel there is any life-threatening condition present. I have reviewed and discussed all results (EKG, imaging, lab, urine as appropriate) and exam findings with patient/family. I have reviewed nursing notes and appropriate previous records.  I feel the patient is safe to be discharged home without further emergent workup and can continue workup as an outpatient as needed. Discussed usual and customary return precautions. Patient/family verbalize understanding and are comfortable with this plan.  Outpatient follow-up has been provided. All questions have been answered.    EKG Interpretation  Date/Time:  Sunday December 31 2016 21:16:17 EST Ventricular Rate:  121 PR Interval:  116 QRS Duration: 88 QT Interval:  298 QTC Calculation: 423 R Axis:   82 Text Interpretation:  Sinus tachycardia Right atrial enlargement Left ventricular hypertrophy Nonspecific ST and T wave  abnormality Abnormal ECG No old tracing to compare Confirmed by Dail Meece,  DO, [place] ([id number]) on 01/01/2017 12:08:54 AM        I personally performed the services described in this documentation, which was scribed in my presence. The recorded information has been reviewed and is accurate.    Layla MawKristen N Christiano Blandon, DO 01/01/17 [phone]

## 2017-03-13 ENCOUNTER — Emergency Department (HOSPITAL_COMMUNITY)
Admission: EM | Admit: 2017-03-13 | Discharge: 2017-03-13 | Disposition: A | Discharge status: Home / Self Care | Payer: Medicaid Other | Attending: Emergency Medicine | Admitting: Emergency Medicine

## 2017-03-13 ENCOUNTER — Encounter (HOSPITAL_COMMUNITY): Payer: Self-pay | Admitting: Emergency Medicine

## 2017-03-13 DIAGNOSIS — F17210 Nicotine dependence, cigarettes, uncomplicated: Secondary | ICD-10-CM | POA: Insufficient documentation

## 2017-03-13 DIAGNOSIS — Y929 Unspecified place or not applicable: Secondary | ICD-10-CM | POA: Insufficient documentation

## 2017-03-13 DIAGNOSIS — T148XXA Other injury of unspecified body region, initial encounter: Secondary | ICD-10-CM

## 2017-03-13 DIAGNOSIS — Y999 Unspecified external cause status: Secondary | ICD-10-CM | POA: Insufficient documentation

## 2017-03-13 DIAGNOSIS — S39012A Strain of muscle, fascia and tendon of lower back, initial encounter: Secondary | ICD-10-CM | POA: Insufficient documentation

## 2017-03-13 DIAGNOSIS — Y939 Activity, unspecified: Secondary | ICD-10-CM | POA: Insufficient documentation

## 2017-03-13 DIAGNOSIS — X58XXXA Exposure to other specified factors, initial encounter: Secondary | ICD-10-CM | POA: Insufficient documentation

## 2017-03-13 LAB — URINALYSIS, ROUTINE W REFLEX MICROSCOPIC
Bilirubin Urine: NEGATIVE
GLUCOSE, UA: NEGATIVE mg/dL
Hgb urine dipstick: NEGATIVE
Ketones, ur: NEGATIVE mg/dL
Leukocytes, UA: NEGATIVE
Nitrite: NEGATIVE
Protein, ur: NEGATIVE mg/dL
Specific Gravity, Urine: 1.012 (ref 1.005–1.030)
pH: 6 (ref 5.0–8.0)

## 2017-03-13 MED ORDER — IBUPROFEN 800 MG PO TABS
800.0000 mg | ORAL_TABLET | Freq: Once | ORAL | Status: AC
  Administered 2017-03-13: 800 mg via ORAL
  Filled 2017-03-13: qty 1

## 2017-03-13 MED ORDER — IBUPROFEN 600 MG PO TABS: 600.0000 mg | ORAL_TABLET | Freq: Four times a day (QID) | ORAL | 0 refills | Status: DC | PRN

## 2017-03-13 NOTE — ED Provider Notes (Signed)
[facility] Provider Note   CSN: [id number] Arrival date & time: 03/13/17  0157     History   Chief Complaint Chief Complaint  Patient presents with  . Back Pain    HPI Timothy [name] is a 21 y.o. male.  21 year old male who is here with 2 hour history of bilateral lower back pain radiates to his abdomen. He admits to drinking alcohol this evening. Denies any dysuria or hematuria. No fever or chills. No vomiting or diarrhea. Pain does not radiate to his legs. No bowel or bladder dysfunction. Pain is characterized as sharp and worse with certain movements. Denies any recent history of trauma. No treatment use prior to arrival.      Past Medical History:  Diagnosis Date  . Febrile seizures (HCC)    last at 2 y of age    There are no active problems to display for this patient.   History reviewed. No pertinent surgical history.     Home Medications    Prior to Admission medications   Medication Sig Start Date End Date Taking? Authorizing Provider  albuterol (PROVENTIL HFA;VENTOLIN HFA) 108 (90 BASE) MCG/ACT inhaler Inhale 2 puffs into the lungs every 6 (six) hours as needed for wheezing.    Historical Provider, MD  doxycycline (VIBRAMYCIN) 100 MG capsule Take 1 capsule (100 mg total) by mouth 2 (two) times daily. 03/18/16   Ivery Quale, PA-C  ondansetron (ZOFRAN ODT) 4 MG disintegrating tablet Take 1 tablet (4 mg total) by mouth every 8 (eight) hours as needed for nausea or vomiting. 01/01/17   Kristen N Ward, DO  ondansetron (ZOFRAN) 4 MG tablet Take 1 tablet (4 mg total) by mouth every 6 (six) hours. 05/02/15   Raeford Razor, MD    Family History Family History  Problem Relation Age of Onset  . Diabetes Maternal Grandmother     Social History Social History  Substance Use Topics  . Smoking status: Heavy Tobacco Smoker    Packs/day: 1.00    Types: Cigarettes  . Smokeless tobacco: Never Used  . Alcohol use Yes     Allergies   Amoxil  [amoxicillin]   Review of Systems Review of Systems  All other systems reviewed and are negative.    Physical Exam Updated Vital Signs BP 133/75   Pulse 95   Temp 98.4 F (36.9 C)   Resp 18   Ht  (1.651 m)   Wt 68.9 kg   SpO2 97%   BMI 25.29 kg/m   Physical Exam  Constitutional: He is oriented to person, place, and time. He appears well-developed and well-nourished.  Non-toxic appearance. No distress.  HENT:  Head: Normocephalic and atraumatic.  Eyes: Conjunctivae, EOM and lids are normal. Pupils are equal, round, and reactive to light.  Neck: Normal range of motion. Neck supple. No tracheal deviation present. No thyroid mass present.  Cardiovascular: Normal rate, regular rhythm and normal heart sounds.  Exam reveals no gallop.   No murmur heard. Pulmonary/Chest: Effort normal and breath sounds normal. No stridor. No respiratory distress. He has no decreased breath sounds. He has no wheezes. He has no rhonchi. He has no rales.  Abdominal: Soft. Normal appearance and bowel sounds are normal. He exhibits no distension. There is no tenderness. There is no rebound and no CVA tenderness.  Musculoskeletal: Normal range of motion. He exhibits no edema or tenderness.       Back:  Neurological: He is alert and oriented to person, place, and time.  He has normal strength. No cranial nerve deficit or sensory deficit. GCS eye subscore is 4. GCS verbal subscore is 5. GCS motor subscore is 6.  Skin: Skin is warm and dry. No abrasion and no rash noted.  Psychiatric: He has a normal mood and affect. His speech is normal and behavior is normal.  Nursing note and vitals reviewed.    ED Treatments / Results  Labs (all labs ordered are listed, but only abnormal results are displayed) Labs Reviewed  URINALYSIS, ROUTINE W REFLEX MICROSCOPIC    EKG  EKG Interpretation None       Radiology No results found.  Procedures Procedures (including critical care time)  Medications  Ordered in ED Medications  ibuprofen (ADVIL,MOTRIN) tablet 800 mg (not administered)     Initial Impression / Assessment and Plan / ED Course  I have reviewed the triage vital signs and the nursing notes.  Pertinent labs & imaging results that were available during my care of the patient were reviewed by me and considered in my medical decision making (see chart for details).     Patient given Motrin and feels better. Urinalysis negative. Suspect musculoskeletal back pain will discharge home  Final Clinical Impressions(s) / ED Diagnoses   Final diagnoses:  None    New Prescriptions New Prescriptions   No medications on file     Lorre Nick, MD 03/13/17 [phone]

## 2017-03-13 NOTE — ED Triage Notes (Signed)
Pt c/o lower back pain that radiates to lower abd x 2 hours. Pt denies any injury.

## 2017-03-13 NOTE — ED Notes (Signed)
Pt unable to provide urine sample at this time.  Pt states "My back hurts worse when I try to pee"

## 2018-03-31 ENCOUNTER — Emergency Department (HOSPITAL_COMMUNITY)
Admission: EM | Admit: 2018-03-31 | Discharge: 2018-03-31 | Disposition: A | Discharge status: Home / Self Care | Payer: Self-pay | Attending: Emergency Medicine | Admitting: Emergency Medicine

## 2018-03-31 ENCOUNTER — Encounter (HOSPITAL_COMMUNITY): Payer: Self-pay

## 2018-03-31 ENCOUNTER — Other Ambulatory Visit: Payer: Self-pay

## 2018-03-31 DIAGNOSIS — Y998 Other external cause status: Secondary | ICD-10-CM | POA: Insufficient documentation

## 2018-03-31 DIAGNOSIS — S0990XA Unspecified injury of head, initial encounter: Secondary | ICD-10-CM

## 2018-03-31 DIAGNOSIS — W228XXA Striking against or struck by other objects, initial encounter: Secondary | ICD-10-CM | POA: Insufficient documentation

## 2018-03-31 DIAGNOSIS — Y9389 Activity, other specified: Secondary | ICD-10-CM | POA: Insufficient documentation

## 2018-03-31 DIAGNOSIS — Y92009 Unspecified place in unspecified non-institutional (private) residence as the place of occurrence of the external cause: Secondary | ICD-10-CM | POA: Insufficient documentation

## 2018-03-31 DIAGNOSIS — F17210 Nicotine dependence, cigarettes, uncomplicated: Secondary | ICD-10-CM | POA: Insufficient documentation

## 2018-03-31 DIAGNOSIS — Z79899 Other long term (current) drug therapy: Secondary | ICD-10-CM | POA: Insufficient documentation

## 2018-03-31 DIAGNOSIS — S0101XA Laceration without foreign body of scalp, initial encounter: Secondary | ICD-10-CM | POA: Insufficient documentation

## 2018-03-31 MED ORDER — LIDOCAINE-EPINEPHRINE (PF) 2 %-1:200000 IJ SOLN
INTRAMUSCULAR | Status: AC
  Administered 2018-03-31: 5 mL
  Filled 2018-03-31: qty 20
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
## 2018-03-31 NOTE — Discharge Instructions (Addendum)
Staples to be removed in 5 days.    Return sooner if you experience severe headache, dizziness, redness or pus draining from the wound, or other new and concerning symptoms.
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
## 2018-04-05 ENCOUNTER — Encounter (HOSPITAL_COMMUNITY): Payer: Self-pay | Admitting: Emergency Medicine

## 2018-04-05 ENCOUNTER — Other Ambulatory Visit: Payer: Self-pay

## 2018-04-05 ENCOUNTER — Emergency Department (HOSPITAL_COMMUNITY)
Admission: EM | Admit: 2018-04-05 | Discharge: 2018-04-05 | Disposition: A | Discharge status: Home / Self Care | Payer: Self-pay | Attending: Emergency Medicine | Admitting: Emergency Medicine

## 2018-04-05 DIAGNOSIS — Z4802 Encounter for removal of sutures: Secondary | ICD-10-CM | POA: Insufficient documentation

## 2018-04-05 DIAGNOSIS — S0101XD Laceration without foreign body of scalp, subsequent encounter: Secondary | ICD-10-CM | POA: Insufficient documentation

## 2018-04-05 DIAGNOSIS — F17210 Nicotine dependence, cigarettes, uncomplicated: Secondary | ICD-10-CM | POA: Insufficient documentation

## 2018-04-05 DIAGNOSIS — X58XXXD Exposure to other specified factors, subsequent encounter: Secondary | ICD-10-CM | POA: Insufficient documentation
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
## 2018-04-05 NOTE — Discharge Instructions (Addendum)
Clean the area with mild soap and water.  Return here if needed

## 2018-05-28 ENCOUNTER — Emergency Department (HOSPITAL_COMMUNITY)
Admission: EM | Admit: 2018-05-28 | Discharge: 2018-05-29 | Disposition: A | Discharge status: Home / Self Care | Payer: Self-pay | Attending: Emergency Medicine | Admitting: Emergency Medicine

## 2018-05-28 ENCOUNTER — Encounter (HOSPITAL_COMMUNITY): Payer: Self-pay | Admitting: *Deleted

## 2018-05-28 ENCOUNTER — Emergency Department (HOSPITAL_COMMUNITY): Payer: Self-pay

## 2018-05-28 DIAGNOSIS — S0081XA Abrasion of other part of head, initial encounter: Secondary | ICD-10-CM | POA: Insufficient documentation

## 2018-05-28 DIAGNOSIS — S31111A Laceration without foreign body of abdominal wall, left upper quadrant without penetration into peritoneal cavity, initial encounter: Secondary | ICD-10-CM | POA: Insufficient documentation

## 2018-05-28 DIAGNOSIS — Y9389 Activity, other specified: Secondary | ICD-10-CM | POA: Insufficient documentation

## 2018-05-28 DIAGNOSIS — Y929 Unspecified place or not applicable: Secondary | ICD-10-CM | POA: Insufficient documentation

## 2018-05-28 DIAGNOSIS — Y999 Unspecified external cause status: Secondary | ICD-10-CM | POA: Insufficient documentation

## 2018-05-28 DIAGNOSIS — S31119A Laceration without foreign body of abdominal wall, unspecified quadrant without penetration into peritoneal cavity, initial encounter: Secondary | ICD-10-CM

## 2018-05-28 DIAGNOSIS — R1012 Left upper quadrant pain: Secondary | ICD-10-CM | POA: Insufficient documentation

## 2018-05-28 DIAGNOSIS — Z79899 Other long term (current) drug therapy: Secondary | ICD-10-CM | POA: Insufficient documentation

## 2018-05-28 DIAGNOSIS — F17210 Nicotine dependence, cigarettes, uncomplicated: Secondary | ICD-10-CM | POA: Insufficient documentation

## 2018-05-28 MED ORDER — IOPAMIDOL (ISOVUE-300) INJECTION 61%
100.0000 mL | Freq: Once | INTRAVENOUS | Status: AC | PRN
  Administered 2018-05-29: 100 mL via INTRAVENOUS
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
## 2018-05-29 LAB — I-STAT CHEM 8, ED
BUN: 6 mg/dL (ref 6–20)
Calcium, Ion: 1.16 mmol/L (ref 1.15–1.40)
Chloride: 105 mmol/L (ref 98–111)
Creatinine, Ser: 1.1 mg/dL (ref 0.61–1.24)
GLUCOSE: 86 mg/dL (ref 70–99)
HEMATOCRIT: 49 % (ref 39.0–52.0)
HEMOGLOBIN: 16.7 g/dL (ref 13.0–17.0)
POTASSIUM: 4 mmol/L (ref 3.5–5.1)
Sodium: 142 mmol/L (ref 135–145)
TCO2: 24 mmol/L (ref 22–32)
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
## 2018-05-29 NOTE — Discharge Instructions (Addendum)
Keep the wounds clean and dry.  You can use triple antibiotic ointment on the wounds until they heal.  You can take ibuprofen 600 mg + acetaminophen 1000 mg every 6 hrs for pain. Recheck if the wounds appear to be getting infected such as increasing swelling, drainage of pus, increased redness, increased pain or fever.  Return to the emergency department for any symptoms listed on the head injury sheet.

## 2019-07-29 ENCOUNTER — Emergency Department (HOSPITAL_COMMUNITY): Payer: Self-pay

## 2019-07-29 ENCOUNTER — Other Ambulatory Visit: Payer: Self-pay

## 2019-07-29 ENCOUNTER — Encounter (HOSPITAL_COMMUNITY): Payer: Self-pay | Admitting: Emergency Medicine

## 2019-07-29 ENCOUNTER — Emergency Department (HOSPITAL_COMMUNITY)
Admission: EM | Admit: 2019-07-29 | Discharge: 2019-07-29 | Disposition: A | Discharge status: Home / Self Care | Payer: Self-pay | Attending: Emergency Medicine | Admitting: Emergency Medicine

## 2019-07-29 DIAGNOSIS — R569 Unspecified convulsions: Secondary | ICD-10-CM | POA: Insufficient documentation

## 2019-07-29 DIAGNOSIS — F1210 Cannabis abuse, uncomplicated: Secondary | ICD-10-CM | POA: Insufficient documentation

## 2019-07-29 DIAGNOSIS — F17210 Nicotine dependence, cigarettes, uncomplicated: Secondary | ICD-10-CM | POA: Insufficient documentation

## 2019-07-29 LAB — CBC WITH DIFFERENTIAL/PLATELET
Abs Immature Granulocytes: 0.06 10*3/uL (ref 0.00–0.07)
Basophils Absolute: 0.1 10*3/uL (ref 0.0–0.1)
Basophils Relative: 1 %
Eosinophils Absolute: 0.1 10*3/uL (ref 0.0–0.5)
Eosinophils Relative: 1 %
HCT: 46.9 % (ref 39.0–52.0)
Hemoglobin: 15.5 g/dL (ref 13.0–17.0)
Immature Granulocytes: 1 %
Lymphocytes Relative: 18 %
Lymphs Abs: 1.4 10*3/uL (ref 0.7–4.0)
MCH: 32 pg (ref 26.0–34.0)
MCHC: 33 g/dL (ref 30.0–36.0)
MCV: 96.7 fL (ref 80.0–100.0)
Monocytes Absolute: 0.6 10*3/uL (ref 0.1–1.0)
Monocytes Relative: 8 %
Neutro Abs: 5.6 10*3/uL (ref 1.7–7.7)
Neutrophils Relative %: 71 %
Platelets: 293 10*3/uL (ref 150–400)
RBC: 4.85 MIL/uL (ref 4.22–5.81)
RDW: 13.6 % (ref 11.5–15.5)
WBC: 7.8 10*3/uL (ref 4.0–10.5)
nRBC: 0 % (ref 0.0–0.2)

## 2019-07-29 LAB — RAPID URINE DRUG SCREEN, HOSP PERFORMED
Amphetamines: NOT DETECTED
Barbiturates: NOT DETECTED
Benzodiazepines: NOT DETECTED
Cocaine: NOT DETECTED
Opiates: NOT DETECTED
Tetrahydrocannabinol: POSITIVE — AB

## 2019-07-29 LAB — COMPREHENSIVE METABOLIC PANEL
ALT: 33 U/L (ref 0–44)
AST: 42 U/L — ABNORMAL HIGH (ref 15–41)
Albumin: 4.9 g/dL (ref 3.5–5.0)
Alkaline Phosphatase: 91 U/L (ref 38–126)
Anion gap: 11 (ref 5–15)
BUN: 8 mg/dL (ref 6–20)
CO2: 23 mmol/L (ref 22–32)
Calcium: 10.2 mg/dL (ref 8.9–10.3)
Chloride: 101 mmol/L (ref 98–111)
Creatinine, Ser: 0.81 mg/dL (ref 0.61–1.24)
GFR calc Af Amer: >60 mL/min (ref >60)
GFR calc non Af Amer: >60 mL/min (ref >60)
Glucose, Bld: 100 mg/dL — ABNORMAL HIGH (ref 70–99)
Potassium: 3.7 mmol/L (ref 3.5–5.1)
Sodium: 135 mmol/L (ref 135–145)
Total Bilirubin: 0.8 mg/dL (ref 0.3–1.2)
Total Protein: 7.9 g/dL (ref 6.5–8.1)

## 2019-07-29 LAB — URINALYSIS, ROUTINE W REFLEX MICROSCOPIC
Bacteria, UA: NONE SEEN
Bilirubin Urine: NEGATIVE
Glucose, UA: NEGATIVE mg/dL
Ketones, ur: 20 mg/dL — AB
Leukocytes,Ua: NEGATIVE
Nitrite: NEGATIVE
Protein, ur: 30 mg/dL — AB
Specific Gravity, Urine: 1.016 (ref 1.005–1.030)
pH: 6 (ref 5.0–8.0)

## 2019-07-29 LAB — ETHANOL: Alcohol, Ethyl (B): <10 mg/dL (ref <10)

## 2019-07-29 MED ORDER — SODIUM CHLORIDE 0.9 % IV BOLUS: 1000.0000 mL | Freq: Once | INTRAVENOUS | Status: DC

## 2019-07-29 NOTE — ED Notes (Signed)
Patient's mother called, Ms. Sciarra. Call back number is [phone].
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
## 2019-07-29 NOTE — ED Provider Notes (Signed)
[facility] EMERGENCY DEPARTMENT Provider Note   CSN: [id number] Arrival date & time: 07/29/19  1619     History   Chief Complaint Chief Complaint  Patient presents with  . Seizures    HPI Timothy [name] is a 23 y.o. male with a hx of tobacco use who presents to the ED via EMS s/p seizure activity just PTA. Per patient he had been having a relatively normal day & that he has been in his typical state of health, his mother & step father picked him up from work and he was resting in the back seat of the car & the next thing he remembers is being in the back of an ambulance. He states currently he feels generally well other than a mild headache. No alleviating/aggravating factors. Denies numbness, weakness, dizziness, vomiting, chest pain, or dyspnea. Denies fever, chills, URI sxs, or N/V/D. He denies drug use. States he drinks a 2-4 drinks daily, last drink was last night, he has never had alcohol withdrawal seizures or been admitted to the hospital for alcohol withdraw.   I called & spoke with patient's mother & step father via telephone- they state that the patient was in the back seat of the car acting at baseline when they turned around and he appeared to have "locked up" and then started having generalized shaking for about 45 seconds, he then locked up again and had generalized shaking for another 10-15 seconds. They placed him on his side& called 911. No incontinence. No head injury.   Patient & family deny hx of epilepsy, he had a febrile seizure as a child but otherwise no hx of seizures.     HPI  Past Medical History:  Diagnosis Date  . Febrile seizures (HCC)    last at 2 y of age    There are no active problems to display for this patient.   History reviewed. No pertinent surgical history.      Home Medications    Prior to Admission medications   Medication Sig Start Date End Date Taking? Authorizing Provider  albuterol (PROVENTIL HFA;VENTOLIN HFA) 108 (90 BASE)  MCG/ACT inhaler Inhale 2 puffs into the lungs every 6 (six) hours as needed for wheezing.    [provider]  doxycycline (VIBRAMYCIN) 100 MG capsule Take 1 capsule (100 mg total) by mouth 2 (two) times daily. 03/18/16   Ivery Quale, PA-C  ibuprofen (ADVIL,MOTRIN) 600 MG tablet Take 1 tablet (600 mg total) by mouth every 6 (six) hours as needed. 03/13/17   Lorre Nick, MD  ondansetron (ZOFRAN ODT) 4 MG disintegrating tablet Take 1 tablet (4 mg total) by mouth every 8 (eight) hours as needed for nausea or vomiting. 01/01/17   Ward, Layla Maw, DO  ondansetron (ZOFRAN) 4 MG tablet Take 1 tablet (4 mg total) by mouth every 6 (six) hours. 05/02/15   Raeford Razor, MD    Family History Family History  Problem Relation Age of Onset  . Diabetes Maternal Grandmother     Social History Social History   Tobacco Use  . Smoking status: Heavy Tobacco Smoker    Packs/day: 1.00    Types: Cigarettes  . Smokeless tobacco: Never Used  Substance Use Topics  . Alcohol use: Yes  . Drug use: No    Types: Marijuana    Comment: denies use 05/28/18     Allergies   Amoxil [amoxicillin]   Review of Systems Review of Systems  Constitutional: Negative for chills and fever.  HENT: Negative  for congestion, ear pain and sore throat.   Respiratory: Negative for cough and shortness of breath.   Cardiovascular: Negative for chest pain.  Gastrointestinal: Negative for abdominal pain, diarrhea and vomiting.  Neurological: Positive for seizures and headaches. Negative for dizziness, facial asymmetry, weakness, light-headedness and numbness.  All other systems reviewed and are negative.    Physical Exam Updated Vital Signs Temp 97.8 F (36.6 C) (Oral)   Ht 5\' 10"  (1.778 m)   Wt 79.4 kg   BMI 25.12 kg/m   Physical Exam Vitals signs and nursing note reviewed.  Constitutional:      General: He is not in acute distress.    Appearance: He is well-developed. He is not toxic-appearing.  HENT:      Head: Normocephalic and atraumatic. No raccoon eyes or Battle's sign.     Mouth/Throat:     Mouth: No injury.     Pharynx: Uvula midline.  Eyes:     Extraocular Movements: Extraocular movements intact.     Conjunctiva/sclera: Conjunctivae normal.     Pupils: Pupils are equal, round, and reactive to light.     Comments: Mild conjunctival injection noted bilaterally  Neck:     Musculoskeletal: Normal range of motion and neck supple. No spinous process tenderness.  Cardiovascular:     Rate and Rhythm: Normal rate and regular rhythm.  Pulmonary:     Effort: Pulmonary effort is normal. No respiratory distress.     Breath sounds: Normal breath sounds. No wheezing, rhonchi or rales.  Abdominal:     General: There is no distension.     Palpations: Abdomen is soft.     Tenderness: There is no abdominal tenderness.  Skin:    General: Skin is warm and dry.     Findings: No rash.  Neurological:     Mental Status: He is alert.     Comments: Alert. Clear speech. No facial droop. CNIII-XII grossly intact. Bilateral upper and lower extremities' sensation grossly intact. 5/5 symmetric strength with grip strength and with plantar and dorsi flexion bilaterally. Normal finger to nose bilaterally. Negative pronator drift. Negative Romberg sign. Gait is steady and intact.   Psychiatric:        Behavior: Behavior normal.    ED Treatments / Results  Labs (all labs ordered are listed, but only abnormal results are displayed) Labs Reviewed  COMPREHENSIVE METABOLIC PANEL - Abnormal; Notable for the following components:      Result Value   Glucose, Bld 100 (*)    AST 42 (*)    All other components within normal limits  RAPID URINE DRUG SCREEN, HOSP PERFORMED - Abnormal; Notable for the following components:   Tetrahydrocannabinol POSITIVE (*)    All other components within normal limits  URINALYSIS, ROUTINE W REFLEX MICROSCOPIC - Abnormal; Notable for the following components:   Hgb urine  dipstick SMALL (*)    Ketones, ur 20 (*)    Protein, ur 30 (*)    All other components within normal limits  CBC WITH DIFFERENTIAL/PLATELET  ETHANOL    EKG None  Radiology Ct Head Wo Contrast  Result Date: 07/29/2019 CLINICAL DATA:  Witnessed seizure.  Postictal upon arrival. EXAM: CT HEAD WITHOUT CONTRAST TECHNIQUE: Contiguous axial images were obtained from the base of the skull through the vertex without intravenous contrast. COMPARISON:  None. FINDINGS: Brain: The ventricles are normal in size and configuration. No extra-axial fluid collections are identified. The gray-white differentiation is maintained. No CT findings for acute hemispheric infarction or  intracranial hemorrhage. No mass lesions. The brainstem and cerebellum are normal. Vascular: No hyperdense vessels or obvious aneurysm. Skull: No acute skull fracture.  No bone lesion. Sinuses/Orbits: The paranasal sinuses and mastoid air cells are clear. The globes are intact. Other: No scalp lesions, laceration or hematoma. IMPRESSION: Normal head CT. Electronically Signed   By: Rudie MeyerP.  Gallerani M.D.   On: 07/29/2019 20:02    Procedures Procedures (including critical care time)  Medications Ordered in ED Medications - No data to display   Initial Impression / Assessment and Plan / ED Course  I have reviewed the triage vital signs and the nursing notes.  Pertinent labs & imaging results that were available during my care of the patient were reviewed by me and considered in my medical decision making (see chart for details).    Patient presents to the ED s/p seizure activity currently back to baseline w/ exception of mild headache. Nontoxic appearing, no apparent distress, vitals WNL. Exam benign, no focal neuro deficits, plan for labs, CT head, EKG, & observation, placed on seizure precautions.   CBC: No anemia or leukocytosis.  CMP: No significant electrolyte derangement. AST minimally elevated. Renal function preserved.  Ethanol:  WNL UDS: + for tetrahydrocannabinol, otherwise negative.  UA: No UTI, mild ketonuria.  EKG: No STEMI or significant arrhythmia.  CT head: Normal   Patient with what appears to be his first seizure other than febrile seizure as a child. Relatively at baseline on ER arrival & throughout stay, no focal neuro deficits, re-assuring work-up in the ED. Considered EtOH withdrawal seizure given daily alcohol use, however does not seem consistent with this given patient is not tachycardic or hypotensive- no other sxs of withdrawal. Will discharge home with close neurology follow up, driving restrictions until cleared by neurology. I discussed results, treatment plan, need for follow-up, and return precautions with the patient. Provided opportunity for questions, patient confirmed understanding and is in agreement with plan.   Findings and plan of care discussed with supervising physician Dr. Juleen ChinaKohut who is in agreement.   Final Clinical Impressions(s) / ED Diagnoses   Final diagnoses:  Seizure-like activity [facility](HCC)    ED Discharge Orders    None       Desmond Lopeetrucelli, Samantha R, PA-C 07/29/19 2101    Raeford RazorKohut, Stephen, MD 08/03/19 1239

## 2019-07-29 NOTE — Discharge Instructions (Addendum)
You were seen in the ER today after seizure activity.  Your work-up in the ER was overall reassuring. Your labs did not show substantial abnormalities. Your liver function test was minimally elevated, urine showed some ketones and a bit of blood which should be rechecked by primary care.   Your CT head did not show any abnormalities.   Given your seizure activity you are NOT TO DRIVE until cleared by a neurologist. Additionally not operating heavy machinery.   Call the neurologist in your discharge instructions tomorrow for a close follow up appointment within 3 days.   Return to the ER for new or worsening symptoms including but not limited to re-occurrence of seizure activity, severe headache, confusion, numbness, weakness, vomiting, abdominal pain, hallucinations, or any other concerns.

## 2019-07-29 NOTE — ED Notes (Signed)
IV to left [facility] removed- cath intact.

## 2020-01-27 ENCOUNTER — Ambulatory Visit: Payer: Medicaid Other | Attending: Internal Medicine

## 2020-01-27 ENCOUNTER — Other Ambulatory Visit: Payer: Self-pay

## 2020-01-27 DIAGNOSIS — Z20822 Contact with and (suspected) exposure to covid-19: Secondary | ICD-10-CM

## 2020-01-28 ENCOUNTER — Telehealth: Payer: Self-pay

## 2020-01-28 LAB — NOVEL CORONAVIRUS, NAA: SARS-CoV-2, NAA: NOT DETECTED

## 2020-01-28 NOTE — Telephone Encounter (Signed)
Patient called and he was informed that his test for COVID-19 done 01/27/20 was not resulted.  He verbalized understanding. He will call later.

## 2020-01-29 ENCOUNTER — Telehealth: Payer: Self-pay

## 2020-01-29 NOTE — Telephone Encounter (Signed)
Patient called and was informed that his test for COVID-19 done 01/27/20 was negative, Not detected.  He verbalized understanding of all information. He was given [employer] for copy.

## 2020-08-19 ENCOUNTER — Other Ambulatory Visit: Payer: Medicaid Other

## 2020-08-26 ENCOUNTER — Other Ambulatory Visit: Payer: Medicaid Other

## 2020-08-26 DIAGNOSIS — Z20822 Contact with and (suspected) exposure to covid-19: Secondary | ICD-10-CM

## 2020-08-27 LAB — SARS-COV-2, NAA 2 DAY TAT

## 2020-08-27 LAB — NOVEL CORONAVIRUS, NAA: SARS-CoV-2, NAA: NOT DETECTED

## 2020-11-04 ENCOUNTER — Other Ambulatory Visit: Payer: Self-pay

## 2020-11-04 DIAGNOSIS — Z20822 Contact with and (suspected) exposure to covid-19: Secondary | ICD-10-CM

## 2020-11-05 LAB — NOVEL CORONAVIRUS, NAA: SARS-CoV-2, NAA: NOT DETECTED

## 2020-11-05 LAB — SPECIMEN STATUS REPORT

## 2020-11-05 LAB — SARS-COV-2, NAA 2 DAY TAT

## 2021-07-17 ENCOUNTER — Other Ambulatory Visit: Payer: Self-pay

## 2021-07-17 ENCOUNTER — Emergency Department (HOSPITAL_COMMUNITY)
Admission: EM | Admit: 2021-07-17 | Discharge: 2021-07-17 | Disposition: A | Discharge status: Home / Self Care | Payer: Medicaid Other | Attending: Emergency Medicine | Admitting: Emergency Medicine

## 2021-07-17 ENCOUNTER — Encounter (HOSPITAL_COMMUNITY): Payer: Self-pay

## 2021-07-17 DIAGNOSIS — R945 Abnormal results of liver function studies: Secondary | ICD-10-CM | POA: Insufficient documentation

## 2021-07-17 DIAGNOSIS — R112 Nausea with vomiting, unspecified: Secondary | ICD-10-CM

## 2021-07-17 DIAGNOSIS — F1010 Alcohol abuse, uncomplicated: Secondary | ICD-10-CM | POA: Insufficient documentation

## 2021-07-17 DIAGNOSIS — R7989 Other specified abnormal findings of blood chemistry: Secondary | ICD-10-CM

## 2021-07-17 DIAGNOSIS — F17210 Nicotine dependence, cigarettes, uncomplicated: Secondary | ICD-10-CM | POA: Insufficient documentation

## 2021-07-17 LAB — COMPREHENSIVE METABOLIC PANEL
ALT: 59 U/L — ABNORMAL HIGH (ref 0–44)
AST: 70 U/L — ABNORMAL HIGH (ref 15–41)
Albumin: 5.8 g/dL — ABNORMAL HIGH (ref 3.5–5.0)
Alkaline Phosphatase: 109 U/L (ref 38–126)
Anion gap: 21 — ABNORMAL HIGH (ref 5–15)
BUN: 11 mg/dL (ref 6–20)
CO2: 24 mmol/L (ref 22–32)
Calcium: 10.4 mg/dL — ABNORMAL HIGH (ref 8.9–10.3)
Chloride: 93 mmol/L — ABNORMAL LOW (ref 98–111)
Creatinine, Ser: 0.86 mg/dL (ref 0.61–1.24)
GFR, Estimated: >60 mL/min (ref >60)
Glucose, Bld: 104 mg/dL — ABNORMAL HIGH (ref 70–99)
Potassium: 3.7 mmol/L (ref 3.5–5.1)
Sodium: 138 mmol/L (ref 135–145)
Total Bilirubin: 2.4 mg/dL — ABNORMAL HIGH (ref 0.3–1.2)
Total Protein: 9.4 g/dL — ABNORMAL HIGH (ref 6.5–8.1)

## 2021-07-17 LAB — CBC WITH DIFFERENTIAL/PLATELET
Abs Immature Granulocytes: 0.06 10*3/uL (ref 0.00–0.07)
Basophils Absolute: 0.1 10*3/uL (ref 0.0–0.1)
Basophils Relative: 1 %
Eosinophils Absolute: 0 10*3/uL (ref 0.0–0.5)
Eosinophils Relative: 0 %
HCT: 50 % (ref 39.0–52.0)
Hemoglobin: 17.1 g/dL — ABNORMAL HIGH (ref 13.0–17.0)
Immature Granulocytes: 1 %
Lymphocytes Relative: 7 %
Lymphs Abs: 0.8 10*3/uL (ref 0.7–4.0)
MCH: 32.2 pg (ref 26.0–34.0)
MCHC: 34.2 g/dL (ref 30.0–36.0)
MCV: 94.2 fL (ref 80.0–100.0)
Monocytes Absolute: 0.7 10*3/uL (ref 0.1–1.0)
Monocytes Relative: 6 %
Neutro Abs: 10.1 10*3/uL — ABNORMAL HIGH (ref 1.7–7.7)
Neutrophils Relative %: 85 %
Platelets: 314 10*3/uL (ref 150–400)
RBC: 5.31 MIL/uL (ref 4.22–5.81)
RDW: 13.2 % (ref 11.5–15.5)
WBC: 11.8 10*3/uL — ABNORMAL HIGH (ref 4.0–10.5)
nRBC: 0 % (ref 0.0–0.2)

## 2021-07-17 LAB — LIPASE, BLOOD: Lipase: 17 U/L (ref 11–51)

## 2021-07-17 MED ORDER — ONDANSETRON 4 MG PO TBDP: 4.0000 mg | ORAL_TABLET | Freq: Three times a day (TID) | ORAL | 0 refills | Status: DC | PRN

## 2021-07-17 MED ORDER — PANTOPRAZOLE SODIUM 40 MG IV SOLR
40.0000 mg | Freq: Once | INTRAVENOUS | Status: AC
Start: 2021-07-17 — End: 2021-07-17
  Administered 2021-07-17: 40 mg via INTRAVENOUS
  Filled 2021-07-17: qty 40

## 2021-07-17 MED ORDER — PANTOPRAZOLE SODIUM 20 MG PO TBEC: 20.0000 mg | DELAYED_RELEASE_TABLET | Freq: Every day | ORAL | 0 refills | Status: DC

## 2021-07-17 MED ORDER — ONDANSETRON HCL 4 MG/2ML IJ SOLN
4.0000 mg | Freq: Once | INTRAMUSCULAR | Status: AC
  Administered 2021-07-17: 4 mg via INTRAVENOUS
  Filled 2021-07-17: qty 2

## 2021-07-17 MED ORDER — SODIUM CHLORIDE 0.9 % IV BOLUS
1000.0000 mL | Freq: Once | INTRAVENOUS | Status: AC
  Administered 2021-07-17: 1000 mL via INTRAVENOUS

## 2021-07-17 MED ORDER — LORAZEPAM 2 MG/ML IJ SOLN
2.0000 mg | Freq: Once | INTRAMUSCULAR | Status: AC
  Administered 2021-07-17: 2 mg via INTRAVENOUS
  Filled 2021-07-17: qty 1

## 2021-07-17 NOTE — Discharge Instructions (Addendum)
You are seen in the emergency department for evaluation of vomiting in the setting of recent alcohol use.  Your blood counts showed your liver numbers to be slightly elevated and this is likely due to the alcohol.  Please decrease your alcohol intake and we are prescribing you some nausea medication along with acid medication.  Return to the emergency department if any worsening or concerning symptoms

## 2021-07-17 NOTE — ED Triage Notes (Signed)
Pt arrives via POV c/o throwing up large amounts of blood and liquid. Pt endorses drinking large amounts of ETOH the past few days.

## 2021-07-17 NOTE — ED Provider Notes (Signed)
[facility] EMERGENCY DEPARTMENT Provider Note   CSN: [id number] Arrival date & time: 07/17/21  2694     History Chief Complaint  Patient presents with   Hemoptysis    Timothy [name] is a 25 y.o. male.  He has no significant past medical history.  He is complaining of vomiting since this morning.  He said there was some blood in the vomit.  He admits to heavy alcohol use over the past few days.  He denies a history of withdrawal seizures.  Minimal abdominal pain.  Does feel tremulous.  The history is provided by the patient.  Emesis Severity:  Severe Duration:  12 hours Timing:  Intermittent Quality:  Unable to specify Progression:  Unchanged Chronicity:  New Recent urination:  Normal Relieved by:  None tried Worsened by:  Nothing Ineffective treatments:  None tried Associated symptoms: abdominal pain   Associated symptoms: no chills, no cough, no diarrhea, no fever and no sore throat   Risk factors: alcohol use       Past Medical History:  Diagnosis Date   Febrile seizures (HCC)    last at 89 y of age    There are no problems to display for this patient.   History reviewed. No pertinent surgical history.     Family History  Problem Relation Age of Onset   Diabetes Maternal Grandmother     Social History   Tobacco Use   Smoking status: Heavy Smoker    Packs/day: 1.00    Types: Cigarettes   Smokeless tobacco: Never  Vaping Use   Vaping Use: Never used  Substance Use Topics   Alcohol use: Yes   Drug use: Yes    Types: Marijuana    Comment: denies use 05/28/18    Home Medications Prior to Admission medications   Medication Sig Start Date End Date Taking? Authorizing Provider  albuterol (PROVENTIL HFA;VENTOLIN HFA) 108 (90 BASE) MCG/ACT inhaler Inhale 2 puffs into the lungs every 6 (six) hours as needed for wheezing.    [provider]  doxycycline (VIBRAMYCIN) 100 MG capsule Take 1 capsule (100 mg total) by mouth 2 (two) times daily. 03/18/16    Ivery Quale, PA-C  ibuprofen (ADVIL,MOTRIN) 600 MG tablet Take 1 tablet (600 mg total) by mouth every 6 (six) hours as needed. 03/13/17   Lorre Nick, MD  ondansetron (ZOFRAN ODT) 4 MG disintegrating tablet Take 1 tablet (4 mg total) by mouth every 8 (eight) hours as needed for nausea or vomiting. 01/01/17   Ward, Layla Maw, DO  ondansetron (ZOFRAN) 4 MG tablet Take 1 tablet (4 mg total) by mouth every 6 (six) hours. 05/02/15   Raeford Razor, MD    Allergies    Amoxil [amoxicillin]  Review of Systems   Review of Systems  Constitutional:  Negative for chills and fever.  HENT:  Negative for sore throat.   Eyes:  Negative for visual disturbance.  Respiratory:  Negative for cough and shortness of breath.   Cardiovascular:  Negative for chest pain.  Gastrointestinal:  Positive for abdominal pain, nausea and vomiting. Negative for diarrhea.  Genitourinary:  Negative for dysuria.  Musculoskeletal:  Negative for neck pain.  Skin:  Negative for rash.  Neurological:  Positive for tremors.   Physical Exam Updated Vital Signs BP (!) 133/106 (BP Location: Right Arm)   Pulse (!) 106   Temp 97.7 F (36.5 C) (Oral)   Resp 20   Ht 5\' 4"  (1.626 m)   Wt 81.6 kg  SpO2 100%   BMI 30.90 kg/m   Physical Exam Vitals and nursing note reviewed.  Constitutional:      Appearance: Normal appearance. He is well-developed.  HENT:     Head: Normocephalic and atraumatic.  Eyes:     Conjunctiva/sclera: Conjunctivae normal.  Cardiovascular:     Rate and Rhythm: Regular rhythm. Tachycardia present.     Heart sounds: No murmur heard. Pulmonary:     Effort: Pulmonary effort is normal. No respiratory distress.     Breath sounds: Normal breath sounds.  Abdominal:     Palpations: Abdomen is soft.     Tenderness: There is no abdominal tenderness. There is no guarding or rebound.  Musculoskeletal:        General: No deformity or signs of injury. Normal range of motion.     Cervical back: Neck supple.   Skin:    General: Skin is warm and dry.  Neurological:     General: No focal deficit present.     Mental Status: He is alert.     Gait: Gait normal.    ED Results / Procedures / Treatments   Labs (all labs ordered are listed, but only abnormal results are displayed) Labs Reviewed  CBC WITH DIFFERENTIAL/PLATELET - Abnormal; Notable for the following components:      Result Value   WBC 11.8 (*)    Hemoglobin 17.1 (*)    Neutro Abs 10.1 (*)    All other components within normal limits  COMPREHENSIVE METABOLIC PANEL - Abnormal; Notable for the following components:   Chloride 93 (*)    Glucose, Bld 104 (*)    Calcium 10.4 (*)    Total Protein 9.4 (*)    Albumin 5.8 (*)    AST 70 (*)    ALT 59 (*)    Total Bilirubin 2.4 (*)    Anion gap 21 (*)    All other components within normal limits  LIPASE, BLOOD    EKG None  Radiology No results found.  Procedures Procedures   Medications Ordered in ED Medications  sodium chloride 0.9 % bolus 1,000 mL (has no administration in time range)  ondansetron (ZOFRAN) injection 4 mg (has no administration in time range)  pantoprazole (PROTONIX) injection 40 mg (has no administration in time range)  LORazepam (ATIVAN) injection 2 mg (has no administration in time range)    ED Course  I have reviewed the triage vital signs and the nursing notes.  Pertinent labs & imaging results that were available during my care of the patient were reviewed by me and considered in my medical decision making (see chart for details).  Clinical Course as of 07/18/21 8938  [name] Jul 17, 2021  2150 Patient's hemoglobin is stable.  His LFTs and bili are up consistent with his alcohol abuse. [MB]  2307 Patient has had no further vomiting here is not tremulous and his tachycardia is improved.  He is tolerating p.o.  Counseled to abstain from alcohol and will put on a PPI.  Return instructions discussed [MB]    Clinical Course User Index [MB] Terrilee Files, MD   MDM Rules/Calculators/A&P                          This patient complains of nausea and vomiting with some blood in the vomitus in the setting of heavy alcohol use; this involves an extensive number of treatment Options and is a complaint that carries with it  a high risk of complications and Morbidity. The differential includes gastritis, peptic ulcer disease, Boerhaave's, esophageal variceal bleed  I ordered, reviewed and interpreted labs, which included CBC with mildly elevated white count, hemoglobin elevated, chemistries and LFTs with some signs of liver disease with elevated AST ALT T bili, normal lipase I ordered medication IV fluids IV PPI IV Ativan IV nausea medication with improvement in his symptoms  Previous records obtained and reviewed in epic no recent admissions   After the interventions stated above, I reevaluated the patient and found patient symptoms to be improved.  He is tolerating p.o.  Tachycardia resolved.  Do not feel needs admission currently.  Counseled on abstinence from alcohol and will start him on some nausea and acid medication.  Return instructions discussed   Final Clinical Impression(s) / ED Diagnoses Final diagnoses:  Non-intractable vomiting with nausea, unspecified vomiting type  Alcohol abuse  Elevated LFTs    Rx / DC Orders ED Discharge Orders     None        Terrilee Files, MD 07/18/21 [phone]

## 2021-08-27 ENCOUNTER — Other Ambulatory Visit: Payer: Self-pay

## 2021-08-27 ENCOUNTER — Observation Stay (HOSPITAL_COMMUNITY)
Admission: EM | Admit: 2021-08-27 | Discharge: 2021-08-28 | Disposition: A | Discharge status: Home / Self Care | Payer: Self-pay | Attending: Family Medicine | Admitting: Family Medicine

## 2021-08-27 ENCOUNTER — Emergency Department (HOSPITAL_COMMUNITY): Payer: Self-pay

## 2021-08-27 ENCOUNTER — Encounter (HOSPITAL_COMMUNITY): Payer: Self-pay | Admitting: Emergency Medicine

## 2021-08-27 DIAGNOSIS — F1010 Alcohol abuse, uncomplicated: Secondary | ICD-10-CM | POA: Diagnosis present

## 2021-08-27 DIAGNOSIS — E8889 Other specified metabolic disorders: Secondary | ICD-10-CM | POA: Diagnosis present

## 2021-08-27 DIAGNOSIS — E8729 Other acidosis: Principal | ICD-10-CM | POA: Diagnosis present

## 2021-08-27 DIAGNOSIS — Z79899 Other long term (current) drug therapy: Secondary | ICD-10-CM | POA: Insufficient documentation

## 2021-08-27 DIAGNOSIS — Z20822 Contact with and (suspected) exposure to covid-19: Secondary | ICD-10-CM | POA: Insufficient documentation

## 2021-08-27 DIAGNOSIS — K2900 Acute gastritis without bleeding: Principal | ICD-10-CM | POA: Insufficient documentation

## 2021-08-27 DIAGNOSIS — F17210 Nicotine dependence, cigarettes, uncomplicated: Secondary | ICD-10-CM | POA: Insufficient documentation

## 2021-08-27 LAB — RESP PANEL BY RT-PCR (FLU A&B, COVID) ARPGX2
Influenza A by PCR: NEGATIVE
Influenza B by PCR: NEGATIVE
SARS Coronavirus 2 by RT PCR: NEGATIVE

## 2021-08-27 LAB — COMPREHENSIVE METABOLIC PANEL
ALT: 28 U/L (ref 0–44)
AST: 49 U/L — ABNORMAL HIGH (ref 15–41)
Albumin: 6 g/dL — ABNORMAL HIGH (ref 3.5–5.0)
Alkaline Phosphatase: 109 U/L (ref 38–126)
Anion gap: >20 — ABNORMAL HIGH (ref 5–15)
BUN: 11 mg/dL (ref 6–20)
CO2: 17 mmol/L — ABNORMAL LOW (ref 22–32)
Calcium: 10.2 mg/dL (ref 8.9–10.3)
Chloride: 99 mmol/L (ref 98–111)
Creatinine, Ser: 0.87 mg/dL (ref 0.61–1.24)
GFR, Estimated: >60 mL/min (ref >60)
Glucose, Bld: 69 mg/dL — ABNORMAL LOW (ref 70–99)
Potassium: 3.9 mmol/L (ref 3.5–5.1)
Sodium: 138 mmol/L (ref 135–145)
Total Bilirubin: 1.7 mg/dL — ABNORMAL HIGH (ref 0.3–1.2)
Total Protein: 9.6 g/dL — ABNORMAL HIGH (ref 6.5–8.1)

## 2021-08-27 LAB — BASIC METABOLIC PANEL
Anion gap: 16 — ABNORMAL HIGH (ref 5–15)
Anion gap: 10 (ref 5–15)
BUN: 9 mg/dL (ref 6–20)
BUN: 8 mg/dL (ref 6–20)
CO2: 17 mmol/L — ABNORMAL LOW (ref 22–32)
CO2: 22 mmol/L (ref 22–32)
Calcium: 9.4 mg/dL (ref 8.9–10.3)
Calcium: 9.3 mg/dL (ref 8.9–10.3)
Chloride: 103 mmol/L (ref 98–111)
Chloride: 100 mmol/L (ref 98–111)
Creatinine, Ser: 0.8 mg/dL (ref 0.61–1.24)
Creatinine, Ser: 0.84 mg/dL (ref 0.61–1.24)
GFR, Estimated: >60 mL/min (ref >60)
GFR, Estimated: >60 mL/min (ref >60)
Glucose, Bld: 120 mg/dL — ABNORMAL HIGH (ref 70–99)
Glucose, Bld: 108 mg/dL — ABNORMAL HIGH (ref 70–99)
Potassium: 4.3 mmol/L (ref 3.5–5.1)
Potassium: 4.5 mmol/L (ref 3.5–5.1)
Sodium: 136 mmol/L (ref 135–145)
Sodium: 132 mmol/L — ABNORMAL LOW (ref 135–145)

## 2021-08-27 LAB — CBC WITH DIFFERENTIAL/PLATELET
Abs Immature Granulocytes: 0.06 10*3/uL (ref 0.00–0.07)
Basophils Absolute: 0.1 10*3/uL (ref 0.0–0.1)
Basophils Relative: 0 %
Eosinophils Absolute: 1.7 10*3/uL — ABNORMAL HIGH (ref 0.0–0.5)
Eosinophils Relative: 12 %
HCT: 49.6 % (ref 39.0–52.0)
Hemoglobin: 16.5 g/dL (ref 13.0–17.0)
Immature Granulocytes: 0 %
Lymphocytes Relative: 5 %
Lymphs Abs: 0.8 10*3/uL (ref 0.7–4.0)
MCH: 32.5 pg (ref 26.0–34.0)
MCHC: 33.3 g/dL (ref 30.0–36.0)
MCV: 97.6 fL (ref 80.0–100.0)
Monocytes Absolute: 0.8 10*3/uL (ref 0.1–1.0)
Monocytes Relative: 5 %
Neutro Abs: 10.9 10*3/uL — ABNORMAL HIGH (ref 1.7–7.7)
Neutrophils Relative %: 78 %
Platelets: 347 10*3/uL (ref 150–400)
RBC: 5.08 MIL/uL (ref 4.22–5.81)
RDW: 13.5 % (ref 11.5–15.5)
WBC: 14.1 10*3/uL — ABNORMAL HIGH (ref 4.0–10.5)
nRBC: 0 % (ref 0.0–0.2)

## 2021-08-27 LAB — ETHANOL: Alcohol, Ethyl (B): <10 mg/dL (ref <10)

## 2021-08-27 MED ORDER — THIAMINE HCL 100 MG/ML IJ SOLN: 100.0000 mg | Freq: Every day | INTRAMUSCULAR | Status: DC

## 2021-08-27 MED ORDER — SODIUM CHLORIDE 0.9 % IV BOLUS
1000.0000 mL | Freq: Once | INTRAVENOUS | Status: AC
  Administered 2021-08-27: 1000 mL via INTRAVENOUS

## 2021-08-27 MED ORDER — SODIUM CHLORIDE 0.9 % IV SOLN
1.0000 mg | Freq: Once | INTRAVENOUS | Status: AC
  Administered 2021-08-27: 1 mg via INTRAVENOUS
  Filled 2021-08-27: qty 0.2

## 2021-08-27 MED ORDER — PANTOPRAZOLE SODIUM 40 MG IV SOLR
40.0000 mg | INTRAVENOUS | Status: DC
  Administered 2021-08-27: 40 mg via INTRAVENOUS
  Filled 2021-08-27: qty 40

## 2021-08-27 MED ORDER — LORAZEPAM 1 MG PO TABS: 1.0000 mg | ORAL_TABLET | ORAL | Status: DC | PRN

## 2021-08-27 MED ORDER — PANTOPRAZOLE SODIUM 40 MG IV SOLR
40.0000 mg | Freq: Once | INTRAVENOUS | Status: AC
  Administered 2021-08-27: 40 mg via INTRAVENOUS
  Filled 2021-08-27: qty 40

## 2021-08-27 MED ORDER — ONDANSETRON HCL 4 MG/2ML IJ SOLN
4.0000 mg | Freq: Once | INTRAMUSCULAR | Status: AC
  Administered 2021-08-27: 4 mg via INTRAVENOUS
  Filled 2021-08-27: qty 2

## 2021-08-27 MED ORDER — DEXTROSE IN LACTATED RINGERS 5 % IV SOLN: INTRAVENOUS | Status: DC

## 2021-08-27 MED ORDER — ONDANSETRON HCL 4 MG/2ML IJ SOLN
4.0000 mg | Freq: Four times a day (QID) | INTRAMUSCULAR | Status: DC | PRN
  Administered 2021-08-27: 4 mg via INTRAVENOUS
  Filled 2021-08-27: qty 2

## 2021-08-27 MED ORDER — DEXTROSE 50 % IV SOLN
25.0000 mL | Freq: Once | INTRAVENOUS | Status: AC
  Administered 2021-08-27: 25 mL via INTRAVENOUS
  Filled 2021-08-27: qty 50

## 2021-08-27 MED ORDER — LORAZEPAM 2 MG/ML IJ SOLN
1.0000 mg | INTRAMUSCULAR | Status: DC | PRN
  Administered 2021-08-27: 2 mg via INTRAVENOUS
  Filled 2021-08-27: qty 1

## 2021-08-27 MED ORDER — FOLIC ACID 1 MG PO TABS
1.0000 mg | ORAL_TABLET | Freq: Every day | ORAL | Status: DC
  Administered 2021-08-28: 1 mg via ORAL
  Filled 2021-08-27: qty 1

## 2021-08-27 MED ORDER — THIAMINE HCL 100 MG PO TABS
100.0000 mg | ORAL_TABLET | Freq: Every day | ORAL | Status: DC
  Administered 2021-08-27 – 2021-08-28 (×2): 100 mg via ORAL
  Filled 2021-08-27 (×2): qty 1

## 2021-08-27 MED ORDER — FOLIC ACID 5 MG/ML IJ SOLN
INTRAMUSCULAR | Status: AC
  Filled 2021-08-27: qty 0.2

## 2021-08-27 MED ORDER — INFLUENZA VAC SPLIT QUAD 0.5 ML IM SUSY: 0.5000 mL | PREFILLED_SYRINGE | INTRAMUSCULAR | Status: DC

## 2021-08-27 MED ORDER — ADULT MULTIVITAMIN W/MINERALS CH
1.0000 | ORAL_TABLET | Freq: Every day | ORAL | Status: DC
  Administered 2021-08-27 – 2021-08-28 (×2): 1 via ORAL
  Filled 2021-08-27 (×2): qty 1

## 2021-08-27 NOTE — H&P (Signed)
History and Physical  Timothy [name] ZDG:[id number] DOB: Dec 04, 1995 DOA: 08/27/2021  Referring physician: Dr Aileen Pilot, ED physician PCP: Patient, No Pcp Per (Inactive)  Outpatient Specialists:   Patient Coming From: home  Chief Complaint: vomiting  HPI: Timothy [name] is a 25 y.o. male with a history of alcohol abuse.  Patient seen for vomiting that started this morning.  Daycare and drinks about a pint of liquor along with a few beers on a daily basis.  This morning, the patient began to have nausea and vomiting.  The patient has been extremely thirsty, but when he drinks water he immediately vomits.  He is unable to eat as that will make him vomit as well.  Emesis is described as stomach contents.  He did vomit some blood earlier in the day, but this appears to be improving and he has not had any emesis with blood since arrival.  Emergency Department Course: Has significant electrolyte dysfunctions with gap metabolic acidosis.  Patient exhibited amp of dextrose and a liter of IV fluids.  Review of Systems:   Pt denies any fevers, chills, diarrhea, constipation, shortness of breath, dyspnea on exertion, orthopnea, cough, wheezing, palpitations, headache, vision changes, lightheadedness, dizziness, melena, rectal bleeding.  Review of systems are otherwise negative  Past Medical History:  Diagnosis Date   Febrile seizures (HCC)    last at 10 y of age   History reviewed. No pertinent surgical history. Social History:  reports that he has been smoking cigarettes. He has been smoking an average of 1 pack per day. He has never used smokeless tobacco. He reports current alcohol use. He reports current drug use. Frequency: 3.00 times per week. Drug: Marijuana. Patient lives at home  Allergies  Allergen Reactions   Amoxil [Amoxicillin] Rash    Family History  Problem Relation Age of Onset   Diabetes Maternal Grandmother       Prior to Admission medications   Medication Sig Start Date End  Date Taking? Authorizing Provider  albuterol (PROVENTIL HFA;VENTOLIN HFA) 108 (90 BASE) MCG/ACT inhaler Inhale 2 puffs into the lungs every 6 (six) hours as needed for wheezing. Patient not taking: No sig reported    [provider]  doxycycline (VIBRAMYCIN) 100 MG capsule Take 1 capsule (100 mg total) by mouth 2 (two) times daily. Patient not taking: No sig reported 03/18/16   Ivery Quale, PA-C  ibuprofen (ADVIL,MOTRIN) 600 MG tablet Take 1 tablet (600 mg total) by mouth every 6 (six) hours as needed. Patient not taking: No sig reported 03/13/17   Lorre Nick, MD  ondansetron (ZOFRAN ODT) 4 MG disintegrating tablet Take 1 tablet (4 mg total) by mouth every 8 (eight) hours as needed for nausea or vomiting. 07/17/21   Terrilee Files, MD  ondansetron (ZOFRAN) 4 MG tablet Take 1 tablet (4 mg total) by mouth every 6 (six) hours. Patient not taking: No sig reported 05/02/15   Raeford Razor, MD  pantoprazole (PROTONIX) 20 MG tablet Take 1 tablet (20 mg total) by mouth daily. 07/17/21   Terrilee Files, MD    Physical Exam: BP (!) 133/92   Pulse 72   Temp 97.7 F (36.5 C) (Oral)   Resp 18   Ht 5\' 4"  (1.626 m)   Wt 77.1 kg   SpO2 99%   BMI 29.18 kg/m   General: Young male. Awake and alert and oriented x3. No acute cardiopulmonary distress.  HEENT: Normocephalic atraumatic.  Right and left ears normal in appearance.  Pupils equal,  round, reactive to light. Extraocular muscles are intact. Sclerae anicteric and noninjected.  Moist mucosal membranes. No mucosal lesions.  Neck: Neck supple without lymphadenopathy. No carotid bruits. No masses palpated.  Cardiovascular: Regular rate with normal S1-S2 sounds. No murmurs, rubs, gallops auscultated. No JVD.  Respiratory: Good respiratory effort with no wheezes, rales, rhonchi. Lungs clear to auscultation bilaterally.  No accessory muscle use. Abdomen: Soft, nontender, nondistended. Active bowel sounds. No masses or hepatosplenomegaly   Skin: No rashes, lesions, or ulcerations.  Dry, warm to touch. 2+ dorsalis pedis and radial pulses. Musculoskeletal: No calf or leg pain. All major joints not erythematous nontender.  No upper or lower joint deformation.  Good ROM.  No contractures  Psychiatric: Intact judgment and insight. Pleasant and cooperative. Neurologic: No focal neurological deficits. Strength is 5/5 and symmetric in upper and lower extremities.  Cranial nerves II through XII are grossly intact.           Labs on Admission: I have personally reviewed following labs and imaging studies  CBC: Recent Labs  Lab 08/27/21 1525  WBC 14.1*  NEUTROABS 10.9*  HGB 16.5  HCT 49.6  MCV 97.6  PLT 347   Basic Metabolic Panel: Recent Labs  Lab 08/27/21 1525 08/27/21 1827  NA 138 136  K 3.9 4.3  CL 99 103  CO2 17* 17*  GLUCOSE 69* 120*  BUN 11 9  CREATININE 0.87 0.80  CALCIUM 10.2 9.4   GFR: Estimated Creatinine Clearance: 133.7 mL/min (by C-G formula based on SCr of 0.8 mg/dL). Liver Function Tests: Recent Labs  Lab 08/27/21 1525  AST 49*  ALT 28  ALKPHOS 109  BILITOT 1.7*  PROT 9.6*  ALBUMIN 6.0*   No results for input(s): LIPASE, AMYLASE in the last 168 hours. No results for input(s): AMMONIA in the last 168 hours. Coagulation Profile: No results for input(s): INR, PROTIME in the last 168 hours. Cardiac Enzymes: No results for input(s): CKTOTAL, CKMB, CKMBINDEX, TROPONINI in the last 168 hours. BNP (last 3 results) No results for input(s): PROBNP in the last 8760 hours. HbA1C: No results for input(s): HGBA1C in the last 72 hours. CBG: No results for input(s): GLUCAP in the last 168 hours. Lipid Profile: No results for input(s): CHOL, HDL, LDLCALC, TRIG, CHOLHDL, LDLDIRECT in the last 72 hours. Thyroid Function Tests: No results for input(s): TSH, T4TOTAL, FREET4, T3FREE, THYROIDAB in the last 72 hours. Anemia Panel: No results for input(s): VITAMINB12, FOLATE, FERRITIN, TIBC, IRON,  RETICCTPCT in the last 72 hours. Urine analysis:    Component Value Date/Time   COLORURINE YELLOW 07/29/2019 1927   APPEARANCEUR CLEAR 07/29/2019 1927   LABSPEC 1.016 07/29/2019 1927   PHURINE 6.0 07/29/2019 1927   GLUCOSEU NEGATIVE 07/29/2019 1927   HGBUR SMALL (A) 07/29/2019 1927   BILIRUBINUR NEGATIVE 07/29/2019 1927   KETONESUR 20 (A) 07/29/2019 1927   PROTEINUR 30 (A) 07/29/2019 1927   NITRITE NEGATIVE 07/29/2019 1927   LEUKOCYTESUR NEGATIVE 07/29/2019 1927   Sepsis Labs: @LABRCNTIP (procalcitonin:4,lacticidven:4) )No results found for this or any previous visit (from the past 240 hour(s)).   Radiological Exams on Admission: DG ABD ACUTE 2+V W 1V CHEST  Result Date: 08/27/2021 CLINICAL DATA:  Vomiting today. EXAM: DG ABDOMEN ACUTE WITH 1 VIEW CHEST COMPARISON:  None. FINDINGS: There is no evidence of dilated bowel loops or free intraperitoneal air. No radiopaque calculi or other significant radiographic abnormality is seen. Heart size and mediastinal contours are within normal limits. Both lungs are clear. IMPRESSION: Negative abdominal radiographs.  No acute cardiopulmonary disease. Electronically Signed   By: Sherian Rein M.D.   On: 08/27/2021 16:33      Assessment/Plan: Active Problems:   Alcoholic ketoacidosis    This patient was discussed with the ED physician, including pertinent vitals, physical exam findings, labs, and imaging.  We also discussed care given by the ED provider.  Alcoholic ketoacidosis Admit Will give patient thiamine and folic acid and start D5 LR CIWA protocol - CIWA currently 12 Check BMP q4 hours Recheck CMP in AM  DVT prophylaxis: SCDs Consultants: none Code Status: full Family Communication: mother present during interview and exam  Disposition Plan: patient to return home   Levie Heritage, DO

## 2021-08-27 NOTE — ED Provider Notes (Signed)
[facility] EMERGENCY DEPARTMENT Provider Note   CSN: [id number] Arrival date & time: 08/27/21  1434     History Chief Complaint  Patient presents with   Emesis    Timothy [name] is a 25 y.o. male.  Patient states he drinks a pint of alcohol a day with some beers.  He has been vomiting all day today and started vomiting up blood numerous times.  Patient feels nauseated and weak  The history is provided by the patient and medical records. No language interpreter was used.  Emesis Severity:  Moderate Duration:  1 day Timing:  Constant Quality:  Coffee grounds Able to tolerate:  Solids and liquids Progression:  Worsening Chronicity:  Recurrent Recent urination:  Normal Relieved by:  Nothing Worsened by:  Nothing Associated symptoms: no abdominal pain, no cough, no diarrhea and no headaches       Past Medical History:  Diagnosis Date   Febrile seizures (HCC)    last at 59 y of age    There are no problems to display for this patient.   History reviewed. No pertinent surgical history.     Family History  Problem Relation Age of Onset   Diabetes Maternal Grandmother     Social History   Tobacco Use   Smoking status: Heavy Smoker    Packs/day: 1.00    Types: Cigarettes   Smokeless tobacco: Never  Vaping Use   Vaping Use: Never used  Substance Use Topics   Alcohol use: Yes    Comment: when asked how much response was everything   Drug use: Yes    Frequency: 3.0 times per week    Types: Marijuana    Comment: denies use 05/28/18    Home Medications Prior to Admission medications   Medication Sig Start Date End Date Taking? Authorizing Provider  albuterol (PROVENTIL HFA;VENTOLIN HFA) 108 (90 BASE) MCG/ACT inhaler Inhale 2 puffs into the lungs every 6 (six) hours as needed for wheezing. Patient not taking: No sig reported    [provider]  doxycycline (VIBRAMYCIN) 100 MG capsule Take 1 capsule (100 mg total) by mouth 2 (two) times daily. Patient  not taking: No sig reported 03/18/16   Ivery Quale, PA-C  ibuprofen (ADVIL,MOTRIN) 600 MG tablet Take 1 tablet (600 mg total) by mouth every 6 (six) hours as needed. Patient not taking: No sig reported 03/13/17   Lorre Nick, MD  ondansetron (ZOFRAN ODT) 4 MG disintegrating tablet Take 1 tablet (4 mg total) by mouth every 8 (eight) hours as needed for nausea or vomiting. 07/17/21   Terrilee Files, MD  ondansetron (ZOFRAN) 4 MG tablet Take 1 tablet (4 mg total) by mouth every 6 (six) hours. Patient not taking: No sig reported 05/02/15   Raeford Razor, MD  pantoprazole (PROTONIX) 20 MG tablet Take 1 tablet (20 mg total) by mouth daily. 07/17/21   Terrilee Files, MD    Allergies    Amoxil [amoxicillin]  Review of Systems   Review of Systems  Constitutional:  Negative for appetite change and fatigue.  HENT:  Negative for congestion, ear discharge and sinus pressure.   Eyes:  Negative for discharge.  Respiratory:  Negative for cough.   Cardiovascular:  Negative for chest pain.  Gastrointestinal:  Positive for vomiting. Negative for abdominal pain and diarrhea.  Genitourinary:  Negative for frequency and hematuria.  Musculoskeletal:  Negative for back pain.  Skin:  Negative for rash.  Neurological:  Negative for seizures and headaches.  Psychiatric/Behavioral:  Negative for hallucinations.    Physical Exam Updated Vital Signs BP (!) 133/92   Pulse 72   Temp 97.7 F (36.5 C) (Oral)   Resp 18   Ht 5\' 4"  (1.626 m)   Wt 77.1 kg   SpO2 99%   BMI 29.18 kg/m   Physical Exam Vitals and nursing note reviewed.  Constitutional:      Appearance: He is well-developed.  HENT:     Head: Normocephalic.     Nose: Nose normal.  Eyes:     General: No scleral icterus.    Conjunctiva/sclera: Conjunctivae normal.  Neck:     Thyroid: No thyromegaly.  Cardiovascular:     Rate and Rhythm: Normal rate and regular rhythm.     Heart sounds: No murmur heard.   No friction rub. No gallop.   Pulmonary:     Breath sounds: No stridor. No wheezing or rales.  Chest:     Chest wall: No tenderness.  Abdominal:     General: There is no distension.     Tenderness: There is no abdominal tenderness. There is no rebound.  Musculoskeletal:        General: Normal range of motion.     Cervical back: Neck supple.  Lymphadenopathy:     Cervical: No cervical adenopathy.  Skin:    Findings: No erythema or rash.  Neurological:     Mental Status: He is alert and oriented to person, place, and time.     Motor: No abnormal muscle tone.     Coordination: Coordination normal.  Psychiatric:        Behavior: Behavior normal.    ED Results / Procedures / Treatments   Labs (all labs ordered are listed, but only abnormal results are displayed) Labs Reviewed  COMPREHENSIVE METABOLIC PANEL - Abnormal; Notable for the following components:      Result Value   CO2 17 (*)    Glucose, Bld 69 (*)    Total Protein 9.6 (*)    Albumin 6.0 (*)    AST 49 (*)    Total Bilirubin 1.7 (*)    Anion gap >20 (*)    All other components within normal limits  CBC WITH DIFFERENTIAL/PLATELET - Abnormal; Notable for the following components:   WBC 14.1 (*)    Neutro Abs 10.9 (*)    Eosinophils Absolute 1.7 (*)    All other components within normal limits  CBC WITH DIFFERENTIAL/PLATELET  ETHANOL    EKG None  Radiology DG ABD ACUTE 2+V W 1V CHEST  Result Date: 08/27/2021 CLINICAL DATA:  Vomiting today. EXAM: DG ABDOMEN ACUTE WITH 1 VIEW CHEST COMPARISON:  None. FINDINGS: There is no evidence of dilated bowel loops or free intraperitoneal air. No radiopaque calculi or other significant radiographic abnormality is seen. Heart size and mediastinal contours are within normal limits. Both lungs are clear. IMPRESSION: Negative abdominal radiographs.  No acute cardiopulmonary disease. Electronically Signed   By: 10/27/2021 M.D.   On: 08/27/2021 16:33    Procedures Procedures   Medications Ordered in  ED Medications  thiamine (B-1) injection 100 mg (has no administration in time range)  folic acid 1 mg in sodium chloride 0.9 % 50 mL IVPB (has no administration in time range)  dextrose 5 % in lactated ringers infusion (has no administration in time range)  sodium chloride 0.9 % bolus 1,000 mL (1,000 mLs Intravenous New Bag/Given 08/27/21 1558)  ondansetron (ZOFRAN) injection 4 mg (4 mg Intravenous Given 08/27/21 1553)  pantoprazole (PROTONIX) injection 40 mg (40 mg Intravenous Given 08/27/21 1553)  dextrose 50 % solution 25 mL (25 mLs Intravenous Given 08/27/21 1736)    ED Course  I have reviewed the triage vital signs and the nursing notes.  Pertinent labs & imaging results that were available during my care of the patient were reviewed by me and considered in my medical decision making (see chart for details).    MDM Rules/Calculators/A&P                           Patient with gastritis and acidosis.  He will be admitted by medicine and hydrated and given Protonix Final Clinical Impression(s) / ED Diagnoses Final diagnoses:  Acute gastritis, presence of bleeding unspecified, unspecified gastritis type    Rx / DC Orders ED Discharge Orders     None        Bethann Berkshire, MD 08/27/21 1742

## 2021-08-27 NOTE — ED Triage Notes (Signed)
Pt to the ED with complaints of emesis that began today.

## 2021-08-28 DIAGNOSIS — E8889 Other specified metabolic disorders: Secondary | ICD-10-CM | POA: Diagnosis present

## 2021-08-28 DIAGNOSIS — F1010 Alcohol abuse, uncomplicated: Secondary | ICD-10-CM | POA: Diagnosis present

## 2021-08-28 LAB — COMPREHENSIVE METABOLIC PANEL
ALT: 23 U/L (ref 0–44)
AST: 31 U/L (ref 15–41)
Albumin: 4.7 g/dL (ref 3.5–5.0)
Alkaline Phosphatase: 84 U/L (ref 38–126)
Anion gap: 9 (ref 5–15)
BUN: 7 mg/dL (ref 6–20)
CO2: 26 mmol/L (ref 22–32)
Calcium: 9.8 mg/dL (ref 8.9–10.3)
Chloride: 100 mmol/L (ref 98–111)
Creatinine, Ser: 0.8 mg/dL (ref 0.61–1.24)
GFR, Estimated: >60 mL/min (ref >60)
Glucose, Bld: 85 mg/dL (ref 70–99)
Potassium: 4.1 mmol/L (ref 3.5–5.1)
Sodium: 135 mmol/L (ref 135–145)
Total Bilirubin: 2.9 mg/dL — ABNORMAL HIGH (ref 0.3–1.2)
Total Protein: 7.7 g/dL (ref 6.5–8.1)

## 2021-08-28 LAB — HIV ANTIBODY (ROUTINE TESTING W REFLEX): HIV Screen 4th Generation wRfx: NONREACTIVE

## 2021-08-28 MED ORDER — DEXTROSE IN LACTATED RINGERS 5 % IV SOLN: INTRAVENOUS | Status: DC

## 2021-08-28 MED ORDER — CHLORDIAZEPOXIDE HCL 10 MG PO CAPS: 10.0000 mg | ORAL_CAPSULE | ORAL | 0 refills | Status: DC

## 2021-08-28 MED ORDER — THIAMINE HCL 100 MG PO TABS
100.0000 mg | ORAL_TABLET | Freq: Every day | ORAL | 3 refills | Status: DC
Start: 2021-08-29 — End: 2022-09-19

## 2021-08-28 MED ORDER — ALBUTEROL SULFATE HFA 108 (90 BASE) MCG/ACT IN AERS: 2.0000 | INHALATION_SPRAY | Freq: Four times a day (QID) | RESPIRATORY_TRACT | 2 refills | Status: DC | PRN

## 2021-08-28 MED ORDER — ONDANSETRON 4 MG PO TBDP: 4.0000 mg | ORAL_TABLET | Freq: Three times a day (TID) | ORAL | 1 refills | Status: DC | PRN

## 2021-08-28 MED ORDER — CHLORDIAZEPOXIDE HCL 5 MG PO CAPS
5.0000 mg | ORAL_CAPSULE | Freq: Four times a day (QID) | ORAL | Status: DC
  Administered 2021-08-28 (×2): 5 mg via ORAL
  Filled 2021-08-28 (×2): qty 1

## 2021-08-28 MED ORDER — OMEPRAZOLE MAGNESIUM 20 MG PO TBEC: 20.0000 mg | DELAYED_RELEASE_TABLET | Freq: Every day | ORAL | 1 refills | Status: DC

## 2021-08-28 MED ORDER — FOLIC ACID 1 MG PO TABS: 1.0000 mg | ORAL_TABLET | Freq: Every day | ORAL | 3 refills | Status: DC

## 2021-08-28 MED ORDER — ADULT MULTIVITAMIN W/MINERALS CH: 1.0000 | ORAL_TABLET | Freq: Every day | ORAL | 3 refills | Status: DC

## 2021-08-28 NOTE — Clinical Social Work Note (Signed)
CSW received SA consult. CSW inquired if patient agreeable to receive SA resources. Patient agreeable. CSW provided SA resources to patient.

## 2021-08-28 NOTE — Discharge Instructions (Signed)
1)For chlordiazepoxide/Librium taper is as follows for Alcohol Withdrawal:- Take Chlordiazepoxide/Librium 2 capsules Three times daily for 2 days, then 2 capsules two times a day for 2 days, then 1 Capsule two times a day for 2 days and STOP   2) complete abstinence from alcohol advised--outpatient alcohol rehab programs including AA meetings and [facility] advised

## 2021-08-28 NOTE — Progress Notes (Signed)
Patient has rested quietly with eyes closed throughout shift.

## 2021-08-28 NOTE — Discharge Summary (Signed)
Timothy [name], is a 25 y.o. male  DOB 1996-07-06  MRN [id number].  Admission date:  08/27/2021  Admitting Physician  Levie Heritage, DO  Discharge Date:  08/28/2021   Primary MD  Patient, No Pcp Per (Inactive)  Recommendations for primary care physician for things to follow:   1)For chlordiazepoxide/Librium taper is as follows for Alcohol Withdrawal:- Take Chlordiazepoxide/Librium 2 capsules Three times daily for 2 days, then 2 capsules two times a day for 2 days, then 1 Capsule two times a day for 2 days and STOP   2) complete abstinence from alcohol advised--outpatient alcohol rehab programs including AA meetings and [facility] advised   Admission Diagnosis  Alcoholic ketoacidosis [E87.29] Acute gastritis, presence of bleeding unspecified, unspecified gastritis type [K29.00]   Discharge Diagnosis  Alcoholic ketoacidosis [E87.29] Acute gastritis, presence of bleeding unspecified, unspecified gastritis type [K29.00]    Principal Problem:   Alcoholic ketoacidosis Active Problems:   Alcohol abuse      Past Medical History:  Diagnosis Date   Febrile seizures (HCC)    last at 50 y of age    History reviewed. No pertinent surgical history.     HPI  from the history and physical done on the day of admission:    Patient Coming From: home   Chief Complaint: vomiting   HPI: Timothy [name] is a 25 y.o. male with a history of alcohol abuse.  Patient seen for vomiting that started this morning.  Daycare and drinks about a pint of liquor along with a few beers on a daily basis.  This morning, the patient began to have nausea and vomiting.  The patient has been extremely thirsty, but when he drinks water he immediately vomits.  He is unable to eat as that will make him vomit as well.  Emesis is described as stomach contents.  He did vomit some blood earlier in the day, but this appears to be improving  and he has not had any emesis with blood since arrival.   Emergency Department Course: Has significant electrolyte dysfunctions with gap metabolic acidosis.  Patient exhibited amp of dextrose and a liter of IV fluids.   Review of Systems:    Pt denies any fevers, chills, diarrhea, constipation, shortness of breath, dyspnea on exertion, orthopnea, cough, wheezing, palpitations, headache, vision changes, lightheadedness, dizziness, melena, rectal bleeding.  Review of systems are otherwise negative      Hospital Course:    1)Alcohol abuse with alcoholic ketoacidosis-----ketoacidosis and increased anion gap resolved after aggressive IV hydration - -No further emesis tolerating oral intake well -Mild tremors but no significant DTs--- okay to use Librium as prescribed for alcohol withdrawal prevention -Multivitamin, thiamine and folic acid as prescribed -Patient admits to drinking at least a quart of liquor every day at times vodka, also drinks beer  -Alcohol rehab program including AA meetings and [facility] services advised  2)Tobacco Abuse--declines nicotine patch,  tobacco cessation advised  Discharge Condition: stable   Follow UP--- PCP as advised  Consults obtained -  na  Diet and Activity recommendation:  As advised  Discharge Instructions    Discharge Instructions     Call MD for:  persistant dizziness or light-headedness   Complete by: As directed    Call MD for:  persistant nausea and vomiting   Complete by: As directed    Call MD for:  severe uncontrolled pain   Complete by: As directed    Call MD for:  temperature >100.4   Complete by: As directed    Diet - low sodium heart healthy   Complete by: As directed    Discharge instructions   Complete by: As directed    1)For chlordiazepoxide/Librium taper is as follows for Alcohol Withdrawal:- Take Chlordiazepoxide/Librium 2 capsules Three times daily for 2 days, then 2 capsules two times a day for 2 days, then 1 Capsule  two times a day for 2 days and STOP   2) complete abstinence from alcohol advised--outpatient alcohol rehab programs including AA meetings and [facility] advised   Increase activity slowly   Complete by: As directed          Discharge Medications     Allergies as of 08/28/2021       Reactions   Amoxil [amoxicillin] Rash        Medication List     STOP taking these medications    doxycycline 100 MG capsule Commonly known as: VIBRAMYCIN   ibuprofen 600 MG tablet Commonly known as: ADVIL   ondansetron 4 MG tablet Commonly known as: ZOFRAN   pantoprazole 20 MG tablet Commonly known as: PROTONIX       TAKE these medications    albuterol 108 (90 Base) MCG/ACT inhaler Commonly known as: VENTOLIN HFA Inhale 2 puffs into the lungs every 6 (six) hours as needed for wheezing.   chlordiazePOXIDE 10 MG capsule Commonly known as: LIBRIUM Take 1 capsule (10 mg total) by mouth See admin instructions. Take Chlordiazepoxide/Librium 2 capsules Three times daily for 3 days, then 1 capsule three times a day for 2 days and STOP   folic acid 1 MG tablet Commonly known as: FOLVITE Take 1 tablet (1 mg total) by mouth daily. Start taking on: August 29, 2021   multivitamin with minerals Tabs tablet Take 1 tablet by mouth daily. Start taking on: August 29, 2021   omeprazole 20 MG tablet Commonly known as: PriLOSEC OTC Take 1 tablet (20 mg total) by mouth daily.   ondansetron 4 MG disintegrating tablet Commonly known as: Zofran ODT Take 1 tablet (4 mg total) by mouth every 8 (eight) hours as needed for nausea or vomiting.   thiamine 100 MG tablet Take 1 tablet (100 mg total) by mouth daily. Start taking on: August 29, 2021        Major procedures and Radiology Reports - PLEASE review detailed and final reports for all details, in brief -   DG ABD ACUTE 2+V W 1V CHEST  Result Date: 08/27/2021 CLINICAL DATA:  Vomiting today. EXAM: DG ABDOMEN ACUTE WITH 1 VIEW  CHEST COMPARISON:  None. FINDINGS: There is no evidence of dilated bowel loops or free intraperitoneal air. No radiopaque calculi or other significant radiographic abnormality is seen. Heart size and mediastinal contours are within normal limits. Both lungs are clear. IMPRESSION: Negative abdominal radiographs.  No acute cardiopulmonary disease. Electronically Signed   By: Sherian Rein M.D.   On: 08/27/2021 16:33    Micro Results   Recent Results (from the past 240 hour(s))  Resp Panel by  RT-PCR (Flu A&B, Covid) Nasopharyngeal Swab     Status: None   Collection Time: 08/27/21  6:59 PM   Specimen: Nasopharyngeal Swab; Nasopharyngeal(NP) swabs in vial transport medium  Result Value Ref Range Status   SARS Coronavirus 2 by RT PCR NEGATIVE NEGATIVE Final    Comment: (NOTE) SARS-CoV-2 target nucleic acids are NOT DETECTED.  The SARS-CoV-2 RNA is generally detectable in upper respiratory specimens during the acute phase of infection. The lowest concentration of SARS-CoV-2 viral copies this assay can detect is 138 copies/mL. A negative result does not preclude SARS-Cov-2 infection and should not be used as the sole basis for treatment or other patient management decisions. A negative result may occur with  improper specimen collection/handling, submission of specimen other than nasopharyngeal swab, presence of viral mutation(s) within the areas targeted by this assay, and inadequate number of viral copies(<138 copies/mL). A negative result must be combined with clinical observations, patient history, and epidemiological information. The expected result is Negative.  Fact Sheet for Patients:  BloggerCourse.com  Fact Sheet for Healthcare Providers:  SeriousBroker.it  This test is no t yet approved or cleared by the [place] FDA and  has been authorized for detection and/or diagnosis of SARS-CoV-2 by FDA under an Emergency Use  Authorization (EUA). This EUA will remain  in effect (meaning this test can be used) for the duration of the COVID-19 declaration under Section 564(b)(1) of the Act, 21 U.S.C.section 360bbb-3(b)(1), unless the authorization is terminated  or revoked sooner.       Influenza A by PCR NEGATIVE NEGATIVE Final   Influenza B by PCR NEGATIVE NEGATIVE Final    Comment: (NOTE) The Xpert Xpress SARS-CoV-2/FLU/RSV plus assay is intended as an aid in the diagnosis of influenza from Nasopharyngeal swab specimens and should not be used as a sole basis for treatment. Nasal washings and aspirates are unacceptable for Xpert Xpress SARS-CoV-2/FLU/RSV testing.  Fact Sheet for Patients: BloggerCourse.com  Fact Sheet for Healthcare Providers: SeriousBroker.it  This test is not yet approved or cleared by the [place] FDA and has been authorized for detection and/or diagnosis of SARS-CoV-2 by FDA under an Emergency Use Authorization (EUA). This EUA will remain in effect (meaning this test can be used) for the duration of the COVID-19 declaration under Section 564(b)(1) of the Act, 21 U.S.C. section 360bbb-3(b)(1), unless the authorization is terminated or revoked.  Performed at [facility], [address], [place], [place] [address]        Today   Subjective    Timothy [name] today has no new complaints -No further emesis -Tolerating oral intake well No fever  Or chills  -No chest pains or palpitations no dizziness -         Patient has been seen and examined prior to discharge   Objective   Blood pressure 130/87, pulse 87, temperature 99 F (37.2 C), temperature source Oral, resp. rate 20, height 5\' 4"  (1.626 m), weight 77.1 kg, SpO2 98 %.   Intake/Output Summary (Last 24 hours) at 08/28/2021 1212 Last data filed at 08/28/2021 0900 Gross per 24 hour  Intake 1263.53 ml  Output --  Net 1263.53 ml    Exam Gen:- Awake Alert, no  acute distress , cooperative HEENT:- [place].AT, No sclera icterus Neck-Supple Neck,No JVD,.  Lungs-  CTAB , good air movement bilaterally  CV- S1, S2 normal, regular Abd-  +ve B.Sounds, Abd Soft, No tenderness,    Extremity/Skin:- No  edema,   good pulses Psych-affect is appropriate, oriented  x3 Neuro-no new focal deficits, mild tremors    Data Review   CBC w Diff:  Lab Results  Component Value Date   WBC 14.1 (H) 08/27/2021   HGB 16.5 08/27/2021   HCT 49.6 08/27/2021   PLT 347 08/27/2021   LYMPHOPCT 5 08/27/2021   MONOPCT 5 08/27/2021   EOSPCT 12 08/27/2021   BASOPCT 0 08/27/2021    CMP:  Lab Results  Component Value Date   NA 135 08/28/2021   K 4.1 08/28/2021   CL 100 08/28/2021   CO2 26 08/28/2021   BUN 7 08/28/2021   CREATININE 0.80 08/28/2021   PROT 7.7 08/28/2021   ALBUMIN 4.7 08/28/2021   BILITOT 2.9 (H) 08/28/2021   ALKPHOS 84 08/28/2021   AST 31 08/28/2021   ALT 23 08/28/2021  .   Total Discharge time is about 33 minutes  Shon Hale M.D on 08/28/2021 at 12:12 PM  Go to [email or web address] -  for contact info  [facility] - Office  [phone]

## 2021-08-28 NOTE — Progress Notes (Signed)
Patient discharged home today, transported by mother home. Discharge paperwork went over with patient, patient verbalized understanding. Belongings sent home with patient. Patient stable upon discharge.

## 2021-08-28 NOTE — Progress Notes (Signed)
Pt IV beeping, IV site evaluated and noted to have crimp in IV angiocath. IV angiocath repositioned, secured. Flushes easily with good blood return. Pt denies c/o. IVF continue per order.

## 2021-10-09 ENCOUNTER — Other Ambulatory Visit: Payer: Self-pay

## 2021-10-09 ENCOUNTER — Encounter (HOSPITAL_COMMUNITY): Payer: Self-pay

## 2021-10-09 ENCOUNTER — Emergency Department (HOSPITAL_COMMUNITY)
Admission: EM | Admit: 2021-10-09 | Discharge: 2021-10-09 | Disposition: A | Discharge status: Home / Self Care | Payer: Medicaid Other | Attending: Emergency Medicine | Admitting: Emergency Medicine

## 2021-10-09 DIAGNOSIS — F17210 Nicotine dependence, cigarettes, uncomplicated: Secondary | ICD-10-CM | POA: Insufficient documentation

## 2021-10-09 DIAGNOSIS — R110 Nausea: Secondary | ICD-10-CM | POA: Insufficient documentation

## 2021-10-09 DIAGNOSIS — R1013 Epigastric pain: Secondary | ICD-10-CM | POA: Insufficient documentation

## 2021-10-09 LAB — COMPREHENSIVE METABOLIC PANEL
ALT: 39 U/L (ref 0–44)
AST: 48 U/L — ABNORMAL HIGH (ref 15–41)
Albumin: 5 g/dL (ref 3.5–5.0)
Alkaline Phosphatase: 92 U/L (ref 38–126)
Anion gap: 12 (ref 5–15)
BUN: 9 mg/dL (ref 6–20)
CO2: 27 mmol/L (ref 22–32)
Calcium: 9.5 mg/dL (ref 8.9–10.3)
Chloride: 100 mmol/L (ref 98–111)
Creatinine, Ser: 0.72 mg/dL (ref 0.61–1.24)
GFR, Estimated: >60 mL/min (ref >60)
Glucose, Bld: 101 mg/dL — ABNORMAL HIGH (ref 70–99)
Potassium: 3.5 mmol/L (ref 3.5–5.1)
Sodium: 139 mmol/L (ref 135–145)
Total Bilirubin: 1.4 mg/dL — ABNORMAL HIGH (ref 0.3–1.2)
Total Protein: 8.1 g/dL (ref 6.5–8.1)

## 2021-10-09 LAB — CBC WITH DIFFERENTIAL/PLATELET
Abs Immature Granulocytes: 0.02 10*3/uL (ref 0.00–0.07)
Basophils Absolute: 0.1 10*3/uL (ref 0.0–0.1)
Basophils Relative: 1 %
Eosinophils Absolute: 0 10*3/uL (ref 0.0–0.5)
Eosinophils Relative: 1 %
HCT: 48.3 % (ref 39.0–52.0)
Hemoglobin: 16.2 g/dL (ref 13.0–17.0)
Immature Granulocytes: 0 %
Lymphocytes Relative: 10 %
Lymphs Abs: 0.7 10*3/uL (ref 0.7–4.0)
MCH: 32.9 pg (ref 26.0–34.0)
MCHC: 33.5 g/dL (ref 30.0–36.0)
MCV: 98 fL (ref 80.0–100.0)
Monocytes Absolute: 0.4 10*3/uL (ref 0.1–1.0)
Monocytes Relative: 6 %
Neutro Abs: 5.9 10*3/uL (ref 1.7–7.7)
Neutrophils Relative %: 82 %
Platelets: 284 10*3/uL (ref 150–400)
RBC: 4.93 MIL/uL (ref 4.22–5.81)
RDW: 13.6 % (ref 11.5–15.5)
WBC: 7.1 10*3/uL (ref 4.0–10.5)
nRBC: 0 % (ref 0.0–0.2)

## 2021-10-09 LAB — LIPASE, BLOOD: Lipase: 38 U/L (ref 11–51)

## 2021-10-09 MED ORDER — ONDANSETRON HCL 4 MG PO TABS: 4.0000 mg | ORAL_TABLET | Freq: Three times a day (TID) | ORAL | 0 refills | Status: DC | PRN

## 2021-10-09 MED ORDER — ONDANSETRON HCL 4 MG/2ML IJ SOLN
4.0000 mg | Freq: Once | INTRAMUSCULAR | Status: AC
  Administered 2021-10-09: 4 mg via INTRAVENOUS
  Filled 2021-10-09: qty 2

## 2021-10-09 MED ORDER — SODIUM CHLORIDE 0.9 % IV BOLUS
1000.0000 mL | Freq: Once | INTRAVENOUS | Status: AC
  Administered 2021-10-09: 1000 mL via INTRAVENOUS

## 2021-10-09 MED ORDER — FAMOTIDINE IN NACL 20-0.9 MG/50ML-% IV SOLN
20.0000 mg | Freq: Once | INTRAVENOUS | Status: AC
  Administered 2021-10-09: 20 mg via INTRAVENOUS
  Filled 2021-10-09: qty 50

## 2021-10-09 MED ORDER — KETOROLAC TROMETHAMINE 30 MG/ML IJ SOLN
15.0000 mg | Freq: Once | INTRAMUSCULAR | Status: AC
  Administered 2021-10-09: 15 mg via INTRAVENOUS
  Filled 2021-10-09: qty 1

## 2021-10-09 NOTE — ED Notes (Signed)
Peanut butter, crackers, and ginger ale given to pt with Dr approval

## 2021-10-09 NOTE — Discharge Instructions (Addendum)
You have been seen and discharged from the emergency department.  Your blood work was normal.  Use nausea medicine as needed.  Stay well-hydrated.  Follow-up with your primary provider for reevaluation and further care. Take home medications as prescribed. If you have any worsening symptoms or further concerns for your health please return to an emergency department for further evaluation.

## 2021-10-09 NOTE — ED Triage Notes (Signed)
Pt reports nausea after drinking "too much alcohol" last night.  Denies vomiting.  Resp even and unlabored.  Skin warm and dry.  nad

## 2021-10-09 NOTE — ED Notes (Signed)
Pt requesting crackers, no nausea or pain noted.

## 2021-10-09 NOTE — ED Provider Notes (Signed)
[facility] EMERGENCY DEPARTMENT Provider Note   CSN: [id number] Arrival date & time: 10/09/21  0109     History Chief Complaint  Patient presents with   Nausea    Timothy [name] is a 25 y.o. male.  HPI  25 year old male with past medical history of alcohol abuse presents the emergency department with concern for epigastric abdominal pain and nausea.  Patient admits to binge drinking last night.  States he woke up this morning with epigastric abdominal pain and nausea.  This is happened before, he is required IV medications.  Denies any problems with his gallbladder or pancreas before.  States he does not drink daily, no symptoms of withdrawal.  No diarrhea or fever.  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 No pertinent surgical history.     Family History  Problem Relation Age of Onset   Diabetes Maternal Grandmother     Social History   Tobacco Use   Smoking status: Heavy Smoker    Packs/day: 1.00    Types: Cigarettes   Smokeless tobacco: Never  Vaping Use   Vaping Use: Never used  Substance Use Topics   Alcohol use: Yes    Comment: when asked how much response was everything   Drug use: Yes    Frequency: 3.0 times per week    Types: Marijuana    Comment: denies use 05/28/18    Home Medications Prior to Admission medications   Medication Sig Start Date End Date Taking? Authorizing Provider  albuterol (VENTOLIN HFA) 108 (90 Base) MCG/ACT inhaler Inhale 2 puffs into the lungs every 6 (six) hours as needed for wheezing. 08/28/21   Shon Hale, MD  chlordiazePOXIDE (LIBRIUM) 10 MG capsule Take 1 capsule (10 mg total) by mouth See admin instructions. Take Chlordiazepoxide/Librium 2 capsules Three times daily for 3 days, then 1 capsule  three times a day for 2 days and STOP 08/28/21   Shon Hale, MD  folic acid (FOLVITE) 1 MG tablet Take 1 tablet (1 mg total) by mouth daily. 08/29/21   Shon Hale, MD  Multiple Vitamin (MULTIVITAMIN WITH MINERALS) TABS tablet Take 1 tablet by mouth daily. 08/29/21   Shon Hale, MD  omeprazole (PRILOSEC OTC) 20 MG tablet Take 1 tablet (20 mg total) by mouth daily. 08/28/21 08/28/22  Shon Hale, MD  ondansetron (ZOFRAN ODT) 4 MG disintegrating tablet Take 1 tablet (4 mg total) by mouth every 8 (eight) hours as needed for nausea or vomiting. 08/28/21   Shon Hale, MD  thiamine 100 MG tablet Take 1 tablet (100 mg total) by mouth daily. 08/29/21   Shon Hale, MD    Allergies    Amoxil [amoxicillin]  Review of Systems   Review of Systems  Constitutional:  Negative for chills and fever.  HENT:  Negative for congestion.   Respiratory:  Negative for shortness of breath.   Cardiovascular:  Negative for chest pain.  Gastrointestinal:  Positive for abdominal pain and nausea. Negative for blood in stool, diarrhea and vomiting.  Genitourinary:  Negative for dysuria.  Musculoskeletal:  Negative for back pain.  Skin:  Negative for rash.  Neurological:  Negative for headaches.   Physical Exam Updated Vital Signs BP 136/88   Pulse 100  Temp 98.1 F (36.7 C)   Resp 18   Ht 5\' 4"  (1.626 m)   Wt 77.1 kg   SpO2 100%   BMI 29.18 kg/m   Physical Exam Vitals and nursing note reviewed.  Constitutional:      General: He is not in acute distress.    Appearance: Normal appearance.  HENT:     Head: Normocephalic.     Mouth/Throat:     Mouth: Mucous membranes are moist.  Cardiovascular:     Rate and Rhythm: Normal rate.  Pulmonary:     Effort: Pulmonary effort is normal. No respiratory distress.  Abdominal:     General: There is no distension.     Palpations: Abdomen is soft.     Tenderness: There is abdominal tenderness. There is no guarding.  Skin:     General: Skin is warm.  Neurological:     Mental Status: He is alert and oriented to person, place, and time. Mental status is at baseline.  Psychiatric:        Mood and Affect: Mood normal.    ED Results / Procedures / Treatments   Labs (all labs ordered are listed, but only abnormal results are displayed) Labs Reviewed  CBC WITH DIFFERENTIAL/PLATELET  COMPREHENSIVE METABOLIC PANEL  LIPASE, BLOOD    EKG None  Radiology No results found.  Procedures Procedures   Medications Ordered in ED Medications  famotidine (PEPCID) IVPB 20 mg premix (20 mg Intravenous New Bag/Given 10/09/21 1018)  sodium chloride 0.9 % bolus 1,000 mL (1,000 mLs Intravenous New Bag/Given 10/09/21 1017)  ondansetron (ZOFRAN) injection 4 mg (4 mg Intravenous Given 10/09/21 1017)  ketorolac (TORADOL) 30 MG/ML injection 15 mg (15 mg Intravenous Given 10/09/21 1017)    ED Course  I have reviewed the triage vital signs and the nursing notes.  Pertinent labs & imaging results that were available during my care of the patient were reviewed by me and considered in my medical decision making (see chart for details).    MDM Rules/Calculators/A&P                           25 year old male presents emergency department epigastric abdominal pain and nausea after night of binge drinking.  Vitals are stable on arrival, slight epigastric tenderness to palpation but otherwise benign abdomen.  Blood work is reassuring, no acute abnormalities.  After IV therapy patient feels significantly improved.  Has been able to p.o. and offers no new concerns or complaints.  Educated patient on consequences of binge drinking and home therapies.  No other evidence of acute illness.  Patient at this time appears safe and stable for discharge and will be treated as an outpatient.  Discharge plan and strict return to ED precautions discussed, patient verbalizes understanding and agreement.  Final Clinical Impression(s) / ED  Diagnoses Final diagnoses:  None    Rx / DC Orders ED Discharge Orders     None        22, DO 10/09/21 1153

## 2021-10-14 ENCOUNTER — Encounter (HOSPITAL_COMMUNITY): Payer: Self-pay | Admitting: *Deleted

## 2021-10-14 ENCOUNTER — Emergency Department (HOSPITAL_COMMUNITY): Payer: Self-pay

## 2021-10-14 ENCOUNTER — Emergency Department (HOSPITAL_COMMUNITY)
Admission: EM | Admit: 2021-10-14 | Discharge: 2021-10-14 | Disposition: A | Discharge status: Home / Self Care | Payer: Self-pay | Attending: Emergency Medicine | Admitting: Emergency Medicine

## 2021-10-14 ENCOUNTER — Other Ambulatory Visit: Payer: Self-pay

## 2021-10-14 DIAGNOSIS — R112 Nausea with vomiting, unspecified: Secondary | ICD-10-CM

## 2021-10-14 DIAGNOSIS — F10229 Alcohol dependence with intoxication, unspecified: Secondary | ICD-10-CM | POA: Insufficient documentation

## 2021-10-14 DIAGNOSIS — F17210 Nicotine dependence, cigarettes, uncomplicated: Secondary | ICD-10-CM | POA: Insufficient documentation

## 2021-10-14 DIAGNOSIS — F10929 Alcohol use, unspecified with intoxication, unspecified: Secondary | ICD-10-CM

## 2021-10-14 DIAGNOSIS — Y906 Blood alcohol level of 120-199 mg/100 ml: Secondary | ICD-10-CM | POA: Insufficient documentation

## 2021-10-14 DIAGNOSIS — E872 Acidosis, unspecified: Secondary | ICD-10-CM | POA: Insufficient documentation

## 2021-10-14 LAB — COMPREHENSIVE METABOLIC PANEL
ALT: 49 U/L — ABNORMAL HIGH (ref 0–44)
AST: 52 U/L — ABNORMAL HIGH (ref 15–41)
Albumin: 5.5 g/dL — ABNORMAL HIGH (ref 3.5–5.0)
Alkaline Phosphatase: 98 U/L (ref 38–126)
Anion gap: 21 — ABNORMAL HIGH (ref 5–15)
BUN: 10 mg/dL (ref 6–20)
CO2: 20 mmol/L — ABNORMAL LOW (ref 22–32)
Calcium: 9.9 mg/dL (ref 8.9–10.3)
Chloride: 98 mmol/L (ref 98–111)
Creatinine, Ser: 0.9 mg/dL (ref 0.61–1.24)
GFR, Estimated: >60 mL/min (ref >60)
Glucose, Bld: 77 mg/dL (ref 70–99)
Potassium: 3.8 mmol/L (ref 3.5–5.1)
Sodium: 139 mmol/L (ref 135–145)
Total Bilirubin: 2 mg/dL — ABNORMAL HIGH (ref 0.3–1.2)
Total Protein: 8.5 g/dL — ABNORMAL HIGH (ref 6.5–8.1)

## 2021-10-14 LAB — CBC
HCT: 48.5 % (ref 39.0–52.0)
Hemoglobin: 16.3 g/dL (ref 13.0–17.0)
MCH: 33 pg (ref 26.0–34.0)
MCHC: 33.6 g/dL (ref 30.0–36.0)
MCV: 98.2 fL (ref 80.0–100.0)
Platelets: 323 10*3/uL (ref 150–400)
RBC: 4.94 MIL/uL (ref 4.22–5.81)
RDW: 13.5 % (ref 11.5–15.5)
WBC: 8.3 10*3/uL (ref 4.0–10.5)
nRBC: 0 % (ref 0.0–0.2)

## 2021-10-14 LAB — ETHANOL: Alcohol, Ethyl (B): 140 mg/dL — ABNORMAL HIGH (ref <10)

## 2021-10-14 MED ORDER — SODIUM CHLORIDE 0.9 % IV BOLUS
1000.0000 mL | Freq: Once | INTRAVENOUS | Status: AC
  Administered 2021-10-14: 1000 mL via INTRAVENOUS

## 2021-10-14 MED ORDER — LORAZEPAM 1 MG PO TABS: 1.0000 mg | ORAL_TABLET | Freq: Four times a day (QID) | ORAL | 0 refills | Status: DC | PRN

## 2021-10-14 MED ORDER — ONDANSETRON HCL 4 MG/2ML IJ SOLN
4.0000 mg | Freq: Once | INTRAMUSCULAR | Status: AC
  Administered 2021-10-14: 4 mg via INTRAVENOUS
  Filled 2021-10-14: qty 2

## 2021-10-14 MED ORDER — DEXTROSE 5 % AND 0.45 % NACL IV BOLUS
1000.0000 mL | Freq: Once | INTRAVENOUS | Status: AC
  Administered 2021-10-14: 1000 mL via INTRAVENOUS

## 2021-10-14 NOTE — ED Notes (Signed)
ED Provider at bedside. 

## 2021-10-14 NOTE — ED Notes (Signed)
Pt given water at this time 

## 2021-10-14 NOTE — ED Triage Notes (Signed)
Pt brought in by [facility] for c/o vomiting blood; pt's family reported to ems pt has been drinking heavily the last few days; ems reported seeing pt sticking his finger down his throat to make him vomit

## 2021-10-14 NOTE — ED Provider Notes (Signed)
[facility]-EMERGENCY DEPT [facility] Emergency Department Provider Note MRN:  [id number]  Arrival date & time: 10/14/21     Chief Complaint   Alcohol intoxication History of Present Illness   Timothy [name] is a 25 y.o. year-old male with no pertinent past medical history presenting to the ED with chief complaint of alcohol intoxication.  Heavy drinking this evening.  Report of hematemesis by EMS.  Patient denies pain or trauma or any other symptoms.  Admits to heavy drinking.  I was unable to obtain an accurate HPI, PMH, or ROS due to the patient's alcohol intoxication.  Level 5 caveat.  Review of Systems  Positive for alcohol intoxication.  Patient's Health History    Past Medical History:  Diagnosis Date   Febrile seizures (HCC)    last at 54 y of age    History reviewed. No pertinent surgical history.  Family History  Problem Relation Age of Onset   Diabetes Maternal Grandmother     Social History   Socioeconomic History   Marital status: Single    Spouse name: Not on file   Number of children: Not on file   Years of education: Not on file   Highest education level: Not on file  Occupational History   Not on file  Tobacco Use   Smoking status: Heavy Smoker    Packs/day: 1.00    Types: Cigarettes   Smokeless tobacco: Never  Vaping Use   Vaping Use: Never used  Substance and Sexual Activity   Alcohol use: Yes    Comment: when asked how much response was everything   Drug use: Yes    Frequency: 3.0 times per week    Types: Marijuana    Comment: denies use 05/28/18   Sexual activity: Yes    Birth control/protection: Condom  Other Topics Concern   Not on file  Social History Narrative   Not on file   Social Determinants of Health   Financial Resource Strain: Not on file  Food Insecurity: Not on file  Transportation Needs: Not on file  Physical Activity: Not on file  Stress: Not on file  Social Connections: Not on file  Intimate Partner Violence: Not on  file     Physical Exam   Vitals:   10/14/21 0349 10/14/21 0400  BP: (!) 122/59 125/88  Pulse: 92   Resp: 16   Temp: 98.4 F (36.9 C)   SpO2: 100%     CONSTITUTIONAL: Well-appearing, NAD NEURO:  Alert and oriented x 3, no focal deficits EYES:  eyes equal and reactive ENT/NECK:  no LAD, no JVD CARDIO: Regular rate, well-perfused, normal S1 and S2 PULM:  CTAB no wheezing or rhonchi GI/GU:  normal bowel sounds, non-distended, non-tender MSK/SPINE:  No gross deformities, no edema SKIN:  no rash, atraumatic PSYCH:  Appropriate speech and behavior  *Additional and/or pertinent findings included in MDM below  Diagnostic and Interventional Summary    EKG Interpretation  Date/Time:    Ventricular Rate:    PR Interval:    QRS Duration:   QT Interval:    QTC Calculation:   R Axis:     Text Interpretation:         Labs Reviewed  COMPREHENSIVE METABOLIC PANEL - Abnormal; Notable for the following components:      Result Value   CO2 20 (*)    Total Protein 8.5 (*)    Albumin 5.5 (*)    AST 52 (*)    ALT 49 (*)  Total Bilirubin 2.0 (*)    Anion gap 21 (*)    All other components within normal limits  ETHANOL - Abnormal; Notable for the following components:   Alcohol, Ethyl (B) 140 (*)    All other components within normal limits  CBC    DG Chest Port 1 View    (Results Pending)    Medications  ondansetron (ZOFRAN) injection 4 mg (has no administration in time range)  dextrose 5 % and 0.45% NaCl 5-0.45 % bolus 1,000 mL (has no administration in time range)  sodium chloride 0.9 % bolus 1,000 mL (0 mLs Intravenous Stopped 10/14/21 0618)  ondansetron (ZOFRAN) injection 4 mg (4 mg Intravenous Given 10/14/21 0431)     Procedures  /  Critical Care Procedures  ED Course and Medical Decision Making  I have reviewed the triage vital signs, the nursing notes, and pertinent available records from the EMR.  Listed above are laboratory and imaging tests that I  personally ordered, reviewed, and interpreted and then considered in my medical decision making (see below for details).  Alcohol intoxication, reported hematemesis.  Patient having some occasional vomiting here in the emergency department but it is clear liquid.  We will continue to monitor closely.  Currently with normal vital signs, benign abdomen.     Labs reveal gap acidosis, alcohol of 150.  Patient seems more clinically sober but continues to have vomiting.  No blood in the vomit.  Symptoms may be explained by alcoholic ketoacidosis, which she has had in the past.  Providing further IV fluids, antinausea medicine.  Would consider repeat BMP after fluids if able to control symptoms versus admission.  Signed out to oncoming provider at shift change.  Elmer Sow. Pilar Plate, MD [facility] Emergency [facility] [email or web address]  Final Clinical Impressions(s) / ED Diagnoses     ICD-10-CM   1. Alcoholic intoxication with complication [facility])  W38.882       ED Discharge Orders     None        Discharge Instructions Discussed with and Provided to Patient:   Discharge Instructions   None       Sabas Sous, MD 10/14/21 [phone]

## 2021-10-14 NOTE — Discharge Instructions (Signed)
Avoid drinking all forms of alcohol.  Start with a clear liquid diet.  We sent a prescription for medicine to help relax your nerves and improve your nausea and allow you to avoid alcohol.  Follow-up with the doctor of your choice as needed for problems.

## 2021-10-14 NOTE — ED Notes (Signed)
Pt tolerating liquids and crackers well.

## 2021-10-14 NOTE — ED Provider Notes (Signed)
7:05 AM-checkout from Dr. Pilar Plate for reassessment after treatment.  He was transferred here by EMS for evaluation of vomiting, with bleeding.  He has history of heavy alcohol abuse.  He had minor metabolic disorder on initial evaluation.  10:20 AM - he has been able to eat and drink some at this time.  He feels like he is well enough to go home.  He states that sometimes he gets "shaky," when he stops drinking.  He is committed to avoiding alcohol.  He would like to try something for nausea.  I will send a prescription for Ativan which should help both nausea and alcohol withdrawal symptoms.  MDM - alcoholism with recurrent vomiting.  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  Doubt impending vascular collapse.  Doubt significant risk for upper GI catastrophic bleeding.  No indication for ongoing management in the setting of hospitalization at this time.   Mancel Bale, MD 10/14/21 1027

## 2021-12-05 ENCOUNTER — Encounter (HOSPITAL_COMMUNITY): Payer: Self-pay | Admitting: *Deleted

## 2021-12-05 ENCOUNTER — Emergency Department (HOSPITAL_COMMUNITY): Payer: Self-pay

## 2021-12-05 ENCOUNTER — Emergency Department (HOSPITAL_COMMUNITY)
Admission: EM | Admit: 2021-12-05 | Discharge: 2021-12-06 | Disposition: A | Discharge status: Home / Self Care | Payer: Self-pay | Attending: Emergency Medicine | Admitting: Emergency Medicine

## 2021-12-05 ENCOUNTER — Other Ambulatory Visit: Payer: Self-pay

## 2021-12-05 DIAGNOSIS — R1030 Lower abdominal pain, unspecified: Secondary | ICD-10-CM | POA: Insufficient documentation

## 2021-12-05 DIAGNOSIS — Z20822 Contact with and (suspected) exposure to covid-19: Secondary | ICD-10-CM | POA: Insufficient documentation

## 2021-12-05 DIAGNOSIS — R61 Generalized hyperhidrosis: Secondary | ICD-10-CM | POA: Insufficient documentation

## 2021-12-05 DIAGNOSIS — R197 Diarrhea, unspecified: Secondary | ICD-10-CM | POA: Insufficient documentation

## 2021-12-05 DIAGNOSIS — R112 Nausea with vomiting, unspecified: Secondary | ICD-10-CM | POA: Insufficient documentation

## 2021-12-05 LAB — URINALYSIS, ROUTINE W REFLEX MICROSCOPIC
Bilirubin Urine: NEGATIVE
Glucose, UA: NEGATIVE mg/dL
Ketones, ur: 40 mg/dL — AB
Leukocytes,Ua: NEGATIVE
Nitrite: NEGATIVE
Protein, ur: 100 mg/dL — AB
Specific Gravity, Urine: >1.03 — ABNORMAL HIGH (ref 1.005–1.030)
pH: 6 (ref 5.0–8.0)

## 2021-12-05 LAB — COMPREHENSIVE METABOLIC PANEL
ALT: 174 U/L — ABNORMAL HIGH (ref 0–44)
AST: 159 U/L — ABNORMAL HIGH (ref 15–41)
Albumin: 5.6 g/dL — ABNORMAL HIGH (ref 3.5–5.0)
Alkaline Phosphatase: 99 U/L (ref 38–126)
Anion gap: 19 — ABNORMAL HIGH (ref 5–15)
BUN: 10 mg/dL (ref 6–20)
CO2: 24 mmol/L (ref 22–32)
Calcium: 10.3 mg/dL (ref 8.9–10.3)
Chloride: 99 mmol/L (ref 98–111)
Creatinine, Ser: 0.79 mg/dL (ref 0.61–1.24)
GFR, Estimated: >60 mL/min (ref >60)
Glucose, Bld: 75 mg/dL (ref 70–99)
Potassium: 3.8 mmol/L (ref 3.5–5.1)
Sodium: 142 mmol/L (ref 135–145)
Total Bilirubin: 1.7 mg/dL — ABNORMAL HIGH (ref 0.3–1.2)
Total Protein: 8.8 g/dL — ABNORMAL HIGH (ref 6.5–8.1)

## 2021-12-05 LAB — RESP PANEL BY RT-PCR (FLU A&B, COVID) ARPGX2
Influenza A by PCR: NEGATIVE
Influenza B by PCR: NEGATIVE
SARS Coronavirus 2 by RT PCR: NEGATIVE

## 2021-12-05 LAB — URINALYSIS, MICROSCOPIC (REFLEX)

## 2021-12-05 LAB — CBC
HCT: 49 % (ref 39.0–52.0)
Hemoglobin: 16.4 g/dL (ref 13.0–17.0)
MCH: 32.6 pg (ref 26.0–34.0)
MCHC: 33.5 g/dL (ref 30.0–36.0)
MCV: 97.4 fL (ref 80.0–100.0)
Platelets: 248 10*3/uL (ref 150–400)
RBC: 5.03 MIL/uL (ref 4.22–5.81)
RDW: 13.2 % (ref 11.5–15.5)
WBC: 10.1 10*3/uL (ref 4.0–10.5)
nRBC: 0 % (ref 0.0–0.2)

## 2021-12-05 LAB — LIPASE, BLOOD: Lipase: 24 U/L (ref 11–51)

## 2021-12-05 LAB — CBG MONITORING, ED: Glucose-Capillary: 112 mg/dL — ABNORMAL HIGH (ref 70–99)

## 2021-12-05 LAB — ACETAMINOPHEN LEVEL: Acetaminophen (Tylenol), Serum: <10 ug/mL — ABNORMAL LOW (ref 10–30)

## 2021-12-05 MED ORDER — METOCLOPRAMIDE HCL 5 MG/ML IJ SOLN
10.0000 mg | Freq: Once | INTRAMUSCULAR | Status: AC
Start: 2021-12-05 — End: 2021-12-05
  Administered 2021-12-05: 10 mg via INTRAVENOUS
  Filled 2021-12-05: qty 2

## 2021-12-05 MED ORDER — ONDANSETRON HCL 4 MG/2ML IJ SOLN
4.0000 mg | Freq: Once | INTRAMUSCULAR | Status: DC | PRN
  Filled 2021-12-05: qty 2

## 2021-12-05 MED ORDER — LACTATED RINGERS IV BOLUS
1000.0000 mL | Freq: Once | INTRAVENOUS | Status: AC
  Administered 2021-12-05: 1000 mL via INTRAVENOUS

## 2021-12-05 MED ORDER — DEXTROSE-NACL 5-0.9 % IV SOLN: INTRAVENOUS | Status: AC

## 2021-12-05 MED ORDER — ONDANSETRON 4 MG PO TBDP
4.0000 mg | ORAL_TABLET | Freq: Once | ORAL | Status: AC | PRN
  Administered 2021-12-05: 4 mg via ORAL

## 2021-12-05 MED ORDER — IOHEXOL 300 MG/ML  SOLN
100.0000 mL | Freq: Once | INTRAMUSCULAR | Status: AC | PRN
  Administered 2021-12-05: 100 mL via INTRAVENOUS

## 2021-12-05 MED ORDER — THIAMINE HCL 100 MG/ML IJ SOLN
100.0000 mg | Freq: Every day | INTRAMUSCULAR | Status: DC
  Administered 2021-12-05: 100 mg via INTRAVENOUS
  Filled 2021-12-05: qty 2

## 2021-12-05 NOTE — ED Notes (Signed)
Pt seen by registration, phlebotomy and other hospital staff sticking finger down throat to make himself vomit

## 2021-12-05 NOTE — ED Notes (Addendum)
Pt continues to stick his finger down his throat. Light yellow liquid noted at the bottom of trash can. No coffee grounds color noted, no blood or no chunks noted in vomit.

## 2021-12-05 NOTE — ED Triage Notes (Signed)
Pt started vomiting today

## 2021-12-05 NOTE — ED Provider Notes (Signed)
[facility] EMERGENCY DEPARTMENT Provider Note   CSN: [id number] Arrival date & time: 12/05/21  1246     History  Chief Complaint  Patient presents with   Emesis    Timothy [name] is a 26 y.o. male presents emergency department for evaluation of nausea, vomiting, and diaphoresis.  Patient reports some blood in emesis along with some coffee-ground emesis.  He reports lower abdominal pain.  He reports that his diarrhea is not watery but more soft.  He denies any chest pain or shortness of breath.  Patient reports he shared two 18 packs of beer with someone the previous night and the patient reports "that is why he is here".  He also endorses marijuana and tobacco use.  He denies any medical or surgical history.  Denies any daily medications.  Allergic to amoxicillin.  Patient has been seen here 4 times in the past 4 months for alcohol induced gastritis, nausea, vomiting.   Emesis Associated symptoms: abdominal pain and diarrhea   Associated symptoms: no arthralgias, no chills, no cough, no fever and no sore throat       Home Medications Prior to Admission medications   Medication Sig Start Date End Date Taking? Authorizing Provider  albuterol (VENTOLIN HFA) 108 (90 Base) MCG/ACT inhaler Inhale 2 puffs into the lungs every 6 (six) hours as needed for wheezing. 08/28/21   Roxan Hockey, MD  chlordiazePOXIDE (LIBRIUM) 10 MG capsule Take 1 capsule (10 mg total) by mouth See admin instructions. Take Chlordiazepoxide/Librium 2 capsules Three times daily for 3 days, then 1 capsule three times a day for 2 days and STOP Patient not taking: Reported on 10/14/2021 08/28/21   Roxan Hockey, MD  folic acid (FOLVITE) 1 MG tablet Take 1 tablet (1 mg total) by mouth daily. 08/29/21   Roxan Hockey, MD  LORazepam (ATIVAN) 1 MG tablet Take 1 tablet (1 mg total) by mouth every 6 (six) hours as needed (Nausea, shakiness). 10/14/21   Daleen Bo, MD  Multiple Vitamin (MULTIVITAMIN WITH MINERALS)  TABS tablet Take 1 tablet by mouth daily. 08/29/21   Roxan Hockey, MD  omeprazole (PRILOSEC OTC) 20 MG tablet Take 1 tablet (20 mg total) by mouth daily. 08/28/21 08/28/22  Roxan Hockey, MD  ondansetron (ZOFRAN ODT) 4 MG disintegrating tablet Take 1 tablet (4 mg total) by mouth every 8 (eight) hours as needed for nausea or vomiting. Patient not taking: Reported on 10/14/2021 08/28/21   Roxan Hockey, MD  ondansetron (ZOFRAN) 4 MG tablet Take 1 tablet (4 mg total) by mouth every 8 (eight) hours as needed for nausea or vomiting. Patient not taking: Reported on 10/14/2021 10/09/21   Horton, Alvin Critchley, DO  thiamine 100 MG tablet Take 1 tablet (100 mg total) by mouth daily. 08/29/21   Roxan Hockey, MD      Allergies    Amoxil [amoxicillin]    Review of Systems   Review of Systems  Constitutional:  Negative for chills and fever.  HENT:  Negative for ear pain and sore throat.   Eyes:  Negative for pain and visual disturbance.  Respiratory:  Negative for cough and shortness of breath.   Cardiovascular:  Negative for chest pain and palpitations.  Gastrointestinal:  Positive for abdominal pain, diarrhea, nausea and vomiting.  Genitourinary:  Negative for dysuria, frequency, hematuria, penile pain, penile swelling, scrotal swelling, testicular pain and urgency.  Musculoskeletal:  Negative for arthralgias and back pain.  Skin:  Negative for color change and rash.  Neurological:  Negative for  seizures and syncope.  All other systems reviewed and are negative.  Physical Exam Updated Vital Signs BP 128/88 (BP Location: Right Arm)    Pulse 64    Temp 98.2 F (36.8 C) (Oral)    Resp 18    Ht 5\' 4"  (1.626 m)    Wt 72.6 kg    SpO2 99%    BMI 27.46 kg/m  Physical Exam Vitals and nursing note reviewed.  Constitutional:      Appearance: Normal appearance.  HENT:     Head: Normocephalic and atraumatic.     Mouth/Throat:     Mouth: Mucous membranes are dry.     Pharynx: No oropharyngeal  exudate or posterior oropharyngeal erythema.  Eyes:     General: No scleral icterus. Cardiovascular:     Rate and Rhythm: Normal rate and regular rhythm.     Heart sounds: No murmur heard. Pulmonary:     Effort: Pulmonary effort is normal. No respiratory distress.     Breath sounds: Normal breath sounds.  Abdominal:     General: Abdomen is flat. Bowel sounds are normal.     Palpations: Abdomen is soft.     Tenderness: There is abdominal tenderness. There is no guarding or rebound.     Comments: Mild lower abdominal tenderness palpation.  No Murphy sign.  Abdomen soft, no guarding or rebound.  NABS.  Musculoskeletal:        General: No deformity.     Cervical back: Normal range of motion.  Skin:    General: Skin is warm and dry.  Neurological:     General: No focal deficit present.     Mental Status: He is alert. Mental status is at baseline.     Gait: Gait normal.    ED Results / Procedures / Treatments   Labs (all labs ordered are listed, but only abnormal results are displayed) Labs Reviewed  COMPREHENSIVE METABOLIC PANEL - Abnormal; Notable for the following components:      Result Value   Total Protein 8.8 (*)    Albumin 5.6 (*)    AST 159 (*)    ALT 174 (*)    Total Bilirubin 1.7 (*)    Anion gap 19 (*)    All other components within normal limits  URINALYSIS, ROUTINE W REFLEX MICROSCOPIC - Abnormal; Notable for the following components:   Specific Gravity, Urine >1.030 (*)    Hgb urine dipstick TRACE (*)    Ketones, ur 40 (*)    Protein, ur 100 (*)    All other components within normal limits  URINALYSIS, MICROSCOPIC (REFLEX) - Abnormal; Notable for the following components:   Bacteria, UA FEW (*)    Non Squamous Epithelial PRESENT (*)    All other components within normal limits  ACETAMINOPHEN LEVEL - Abnormal; Notable for the following components:   Acetaminophen (Tylenol), Serum <10 (*)    All other components within normal limits  CBG MONITORING, ED -  Abnormal; Notable for the following components:   Glucose-Capillary 112 (*)    All other components within normal limits  RESP PANEL BY RT-PCR (FLU A&B, COVID) ARPGX2  LIPASE, BLOOD  CBC  HEPATITIS PANEL, ACUTE    EKG None  Radiology CT ABDOMEN PELVIS W CONTRAST  Result Date: 12/05/2021 CLINICAL DATA:  Abdominal pain with nausea and vomiting today. EXAM: CT ABDOMEN AND PELVIS WITH CONTRAST TECHNIQUE: Multidetector CT imaging of the abdomen and pelvis was performed using the standard protocol following bolus administration of intravenous contrast.  RADIATION DOSE REDUCTION: This exam was performed according to the departmental dose-optimization program which includes automated exposure control, adjustment of the mA and/or kV according to patient size and/or use of iterative reconstruction technique. CONTRAST:  113mL OMNIPAQUE IOHEXOL 300 MG/ML  SOLN COMPARISON:  Abdominopelvic CT 05/29/2018. FINDINGS: Lower chest: Clear lung bases. No significant pleural or pericardial effusion. Mild distal esophageal wall thickening. Hepatobiliary: Subjective hepatic steatosis without focal abnormality. No evidence of gallstones, gallbladder wall thickening or biliary dilatation. Pancreas: Unremarkable. No pancreatic ductal dilatation or surrounding inflammatory changes. Spleen: Normal in size without focal abnormality. Adrenals/Urinary Tract: Both adrenal glands appear normal. The kidneys appear normal without evidence of urinary tract calculus, suspicious lesion or hydronephrosis. No bladder abnormalities are seen. Stomach/Bowel: No enteric contrast was administered. The stomach appears unremarkable for its degree of distention. No evidence of small bowel distension, wall thickening or surrounding inflammation. The appendix appears normal. The colon is diffusely decompressed and suboptimally evaluated. There is mild nonspecific intramural fat deposition throughout the colon. Possible wall thickening of the proximal  sigmoid colon, but no surrounding inflammatory change. No evidence of bowel obstruction. Vascular/Lymphatic: There are no enlarged abdominal or pelvic lymph nodes. No significant vascular findings. Reproductive: The prostate gland and seminal vesicles appear unremarkable. Other: No ascites, focal extraluminal fluid collection or free air. Intact abdominal wall. Musculoskeletal: No acute or significant osseous findings. IMPRESSION: 1. Limited colonic assessment due to diffuse decompression. There is possible wall thickening of the proximal sigmoid colon which could reflect focal colitis, although no surrounding inflammatory changes are identified. The appendix appears normal. 2. Subjective hepatic steatosis. 3. Mild distal esophageal wall thickening. 4. No other significant findings identified. Electronically Signed   By: Richardean Sale M.D.   On: 12/05/2021 16:25    Procedures Procedures    Medications Ordered in ED Medications  ondansetron (ZOFRAN) injection 4 mg (has no administration in time range)  thiamine (B-1) injection 100 mg (100 mg Intravenous Given 12/05/21 1725)  dextrose 5 %-0.9 % sodium chloride infusion ( Intravenous New Bag/Given 12/05/21 1726)  ondansetron (ZOFRAN-ODT) disintegrating tablet 4 mg (4 mg Oral Given 12/05/21 1308)  lactated ringers bolus 1,000 mL (0 mLs Intravenous Stopped 12/05/21 1719)  iohexol (OMNIPAQUE) 300 MG/ML solution 100 mL (100 mLs Intravenous Contrast Given 12/05/21 1605)  metoCLOPramide (REGLAN) injection 10 mg (10 mg Intravenous Given 12/05/21 2127)    ED Course/ Medical Decision Making/ A&P                           Medical Decision Making Amount and/or Complexity of Data Reviewed Labs: ordered. Radiology: ordered.  Risk Prescription drug management.   26 year old male presents the emergency department for evaluation of nausea, vomiting, diarrhea, and lower abdominal pain after drinking alcohol.  Differential diagnosis includes but is not limited  to electrolyte abnormalities, hepatic failure, dehydration, gastroenteritis.  Vital signs are unremarkable.  Normotensive, normal heart rate, afebrile.  Physical exam shows some mild lower abdominal tenderness palpation.  Abdomen soft, no guarding or rebound.  Patient has dry mucous membranes.  Labs ordered.  I independently reviewed and interpreted the patient's labs.  CMP shows elevated AST, ALT, albumin, total protein, and total bilirubin since patient's last visit 1 month ago.  CBC shows no signs leukocytosis or anemia.  Urinalysis shows concentrated urine with ketones and protein.  Lipase normal.  Negative hepatic panel.  Negative acetaminophen level.  Negative for COVID and flu.  The patient's almost quadrupling raise and his  AST ALT, will CT abdomen.  I independently reviewed and interpreted the patient's imaging and agree with radiologist findings of focal colitis without inflammatory changes, subjective hepatic steatosis, mild distal esophageal wall thickening.   EKG shows sinus rhythm with possible atrial enlargement.  Patient was given 1 L of lactated Ringer's and was still having emesis after 2 rounds of 4 mg of Zofran although he was sticking his fingers down his throat repeatedly after myself and nursing but asked him to stop.  Reglan was given as well as some thiamine and D5 normal saline due to the patient's borderline blood sugar. No hematemesis seen while in the ED.  After the Reglan, the nurse reported to me that he has been drinking water and she has not witnessed any emesis as the patient is in front of her in a hallway bed. When asking the patient, he reports that he has not had any vomiting in an hour and would like to go home.   I had a long discussion with on on cessation of alcohol, the patient is not interested in Librium but would like resources. I will send in Reglan prescription for him along with outpatient substance use resources. Discussed with the patient that he will  need to follow up with GI given his image and lab findings. Strict return precautions discussed. Patient agrees to plan. The patient is stable and being discharged home in good condition.   I discussed this case with my attending physician who cosigned this note including patient's presenting symptoms, physical exam, and planned diagnostics and interventions. Attending physician stated agreement with plan or made changes to plan which were implemented.    Final Clinical Impression(s) / ED Diagnoses Final diagnoses:  Nausea vomiting and diarrhea    Rx / DC Orders ED Discharge Orders          Ordered    metoCLOPramide (REGLAN) 10 MG tablet  Every 6 hours        12/06/21 0032              Sherrell Puller, PA-C 12/07/21 1729    Godfrey Pick, MD 12/08/21 1950

## 2021-12-05 NOTE — ED Notes (Signed)
Pt sticking his finger in his mouth to vomit

## 2021-12-06 ENCOUNTER — Other Ambulatory Visit: Payer: Self-pay

## 2021-12-06 ENCOUNTER — Emergency Department (HOSPITAL_COMMUNITY)
Admission: EM | Admit: 2021-12-06 | Discharge: 2021-12-06 | Disposition: A | Discharge status: Home / Self Care | Payer: Medicaid Other | Attending: Emergency Medicine | Admitting: Emergency Medicine

## 2021-12-06 ENCOUNTER — Encounter (HOSPITAL_COMMUNITY): Payer: Self-pay

## 2021-12-06 DIAGNOSIS — R112 Nausea with vomiting, unspecified: Secondary | ICD-10-CM | POA: Insufficient documentation

## 2021-12-06 DIAGNOSIS — Z79899 Other long term (current) drug therapy: Secondary | ICD-10-CM | POA: Insufficient documentation

## 2021-12-06 LAB — CBC WITH DIFFERENTIAL/PLATELET
Abs Immature Granulocytes: 0.09 10*3/uL — ABNORMAL HIGH (ref 0.00–0.07)
Basophils Absolute: 0.1 10*3/uL (ref 0.0–0.1)
Basophils Relative: 0 %
Eosinophils Absolute: 0 10*3/uL (ref 0.0–0.5)
Eosinophils Relative: 0 %
HCT: 45.8 % (ref 39.0–52.0)
Hemoglobin: 15.6 g/dL (ref 13.0–17.0)
Immature Granulocytes: 1 %
Lymphocytes Relative: 5 %
Lymphs Abs: 0.7 10*3/uL (ref 0.7–4.0)
MCH: 31.6 pg (ref 26.0–34.0)
MCHC: 34.1 g/dL (ref 30.0–36.0)
MCV: 92.7 fL (ref 80.0–100.0)
Monocytes Absolute: 1.3 10*3/uL — ABNORMAL HIGH (ref 0.1–1.0)
Monocytes Relative: 9 %
Neutro Abs: 12.2 10*3/uL — ABNORMAL HIGH (ref 1.7–7.7)
Neutrophils Relative %: 85 %
Platelets: 247 10*3/uL (ref 150–400)
RBC: 4.94 MIL/uL (ref 4.22–5.81)
RDW: 12.5 % (ref 11.5–15.5)
WBC: 14.4 10*3/uL — ABNORMAL HIGH (ref 4.0–10.5)
nRBC: 0 % (ref 0.0–0.2)

## 2021-12-06 LAB — LIPASE, BLOOD: Lipase: 27 U/L (ref 11–51)

## 2021-12-06 LAB — COMPREHENSIVE METABOLIC PANEL
ALT: 135 U/L — ABNORMAL HIGH (ref 0–44)
AST: 97 U/L — ABNORMAL HIGH (ref 15–41)
Albumin: 5.3 g/dL — ABNORMAL HIGH (ref 3.5–5.0)
Alkaline Phosphatase: 88 U/L (ref 38–126)
Anion gap: 19 — ABNORMAL HIGH (ref 5–15)
BUN: 9 mg/dL (ref 6–20)
CO2: 24 mmol/L (ref 22–32)
Calcium: 10.6 mg/dL — ABNORMAL HIGH (ref 8.9–10.3)
Chloride: 93 mmol/L — ABNORMAL LOW (ref 98–111)
Creatinine, Ser: 0.77 mg/dL (ref 0.61–1.24)
GFR, Estimated: >60 mL/min (ref >60)
Glucose, Bld: 104 mg/dL — ABNORMAL HIGH (ref 70–99)
Potassium: 3.1 mmol/L — ABNORMAL LOW (ref 3.5–5.1)
Sodium: 136 mmol/L (ref 135–145)
Total Bilirubin: 4.2 mg/dL — ABNORMAL HIGH (ref 0.3–1.2)
Total Protein: 8.6 g/dL — ABNORMAL HIGH (ref 6.5–8.1)

## 2021-12-06 LAB — ETHANOL: Alcohol, Ethyl (B): <10 mg/dL (ref <10)

## 2021-12-06 LAB — HEPATITIS PANEL, ACUTE
HCV Ab: NONREACTIVE
Hep A IgM: NONREACTIVE
Hep B C IgM: NONREACTIVE
Hepatitis B Surface Ag: NONREACTIVE

## 2021-12-06 LAB — MAGNESIUM: Magnesium: 1.8 mg/dL (ref 1.7–2.4)

## 2021-12-06 MED ORDER — ONDANSETRON 8 MG PO TBDP
8.0000 mg | ORAL_TABLET | Freq: Once | ORAL | Status: AC
  Administered 2021-12-06: 8 mg via ORAL
  Filled 2021-12-06: qty 1

## 2021-12-06 MED ORDER — METOCLOPRAMIDE HCL 10 MG PO TABS: 10.0000 mg | ORAL_TABLET | Freq: Four times a day (QID) | ORAL | 0 refills | Status: DC

## 2021-12-06 MED ORDER — PANTOPRAZOLE SODIUM 40 MG PO TBEC: 40.0000 mg | DELAYED_RELEASE_TABLET | Freq: Every day | ORAL | 1 refills | Status: DC

## 2021-12-06 MED ORDER — PANTOPRAZOLE SODIUM 40 MG PO TBEC
40.0000 mg | DELAYED_RELEASE_TABLET | Freq: Once | ORAL | Status: AC
  Administered 2021-12-06: 40 mg via ORAL
  Filled 2021-12-06: qty 1

## 2021-12-06 MED ORDER — PROCHLORPERAZINE EDISYLATE 10 MG/2ML IJ SOLN
10.0000 mg | Freq: Once | INTRAMUSCULAR | Status: AC
  Administered 2021-12-06: 10 mg via INTRAMUSCULAR
  Filled 2021-12-06: qty 2

## 2021-12-06 MED ORDER — POTASSIUM CHLORIDE CRYS ER 20 MEQ PO TBCR
40.0000 meq | EXTENDED_RELEASE_TABLET | Freq: Once | ORAL | Status: AC
  Administered 2021-12-06: 40 meq via ORAL
  Filled 2021-12-06: qty 2

## 2021-12-06 NOTE — Discharge Instructions (Signed)
Take the medicine that was prescribed earlier today, metoclopramide for nausea and vomiting.  We sent a prescription for Protonix to your pharmacy as well.  This will help with heartburn.  See the doctor of your choice if not better in 3 to 4 days.  Continue to avoid alcohol.

## 2021-12-06 NOTE — ED Notes (Signed)
Nausea relieved. Pt co throat and chest pain, states its acid reflux.

## 2021-12-06 NOTE — ED Notes (Signed)
Pt is back and forth getting water but says that he is still vomiting afterwards.  Have not seen any vomit in about 2 hours

## 2021-12-06 NOTE — ED Triage Notes (Signed)
Pt presents to ED with continued vomiting.

## 2021-12-06 NOTE — ED Provider Notes (Signed)
[facility] EMERGENCY DEPARTMENT Provider Note   CSN: [id number] Arrival date & time: 12/06/21  0700     History  Chief Complaint  Patient presents with   Emesis    Timothy [name] is a 26 y.o. male.  HPI He presents for ongoing vomiting.  He was discharged from the [facility] emergency department at 12:36 AM this morning, after evaluation and treatment for the same problem.  He states that since discharge she has continued to vomit.  He is currently vomiting, clear dark liquid.    Home Medications Prior to Admission medications   Medication Sig Start Date End Date Taking? Authorizing Provider  pantoprazole (PROTONIX) 40 MG tablet Take 1 tablet (40 mg total) by mouth daily. 12/06/21  Yes Mancel Bale, MD  albuterol (VENTOLIN HFA) 108 (90 Base) MCG/ACT inhaler Inhale 2 puffs into the lungs every 6 (six) hours as needed for wheezing. 08/28/21   Shon Hale, MD  chlordiazePOXIDE (LIBRIUM) 10 MG capsule Take 1 capsule (10 mg total) by mouth See admin instructions. Take Chlordiazepoxide/Librium 2 capsules Three times daily for 3 days, then 1 capsule three times a day for 2 days and STOP Patient not taking: Reported on 10/14/2021 08/28/21   Shon Hale, MD  folic acid (FOLVITE) 1 MG tablet Take 1 tablet (1 mg total) by mouth daily. 08/29/21   Shon Hale, MD  LORazepam (ATIVAN) 1 MG tablet Take 1 tablet (1 mg total) by mouth every 6 (six) hours as needed (Nausea, shakiness). 10/14/21   Mancel Bale, MD  metoCLOPramide (REGLAN) 10 MG tablet Take 1 tablet (10 mg total) by mouth every 6 (six) hours. 12/06/21   Achille Rich, PA-C  Multiple Vitamin (MULTIVITAMIN WITH MINERALS) TABS tablet Take 1 tablet by mouth daily. 08/29/21   Shon Hale, MD  omeprazole (PRILOSEC OTC) 20 MG tablet Take 1 tablet (20 mg total) by mouth daily. 08/28/21 08/28/22  Shon Hale, MD  ondansetron (ZOFRAN ODT) 4 MG disintegrating tablet Take 1 tablet (4 mg total) by mouth every 8 (eight) hours  as needed for nausea or vomiting. Patient not taking: Reported on 10/14/2021 08/28/21   Shon Hale, MD  ondansetron (ZOFRAN) 4 MG tablet Take 1 tablet (4 mg total) by mouth every 8 (eight) hours as needed for nausea or vomiting. Patient not taking: Reported on 10/14/2021 10/09/21   Horton, Clabe Seal, DO  thiamine 100 MG tablet Take 1 tablet (100 mg total) by mouth daily. 08/29/21   Shon Hale, MD      Allergies    Amoxil [amoxicillin]    Review of Systems   Review of Systems  All other systems reviewed and are negative.  Physical Exam Updated Vital Signs BP 136/88    Pulse 79    Temp 98.7 F (37.1 C) (Oral)    Resp 16    Ht 5\' 4"  (1.626 m)    Wt 72.5 kg    SpO2 93%    BMI 27.44 kg/m  Physical Exam Vitals and nursing note reviewed.  Constitutional:      General: He is not in acute distress.    Appearance: He is well-developed. He is not ill-appearing or toxic-appearing.  HENT:     Head: Normocephalic and atraumatic.     Right Ear: External ear normal.     Left Ear: External ear normal.  Eyes:     Conjunctiva/sclera: Conjunctivae normal.     Pupils: Pupils are equal, round, and reactive to light.  Neck:     Trachea: Phonation  normal.  Cardiovascular:     Rate and Rhythm: Normal rate.  Pulmonary:     Effort: Pulmonary effort is normal. No respiratory distress.     Breath sounds: No stridor.  Abdominal:     General: There is no distension.     Tenderness: There is no abdominal tenderness.  Musculoskeletal:        General: Normal range of motion.     Cervical back: Normal range of motion and neck supple.  Skin:    General: Skin is warm and dry.  Neurological:     Mental Status: He is alert and oriented to person, place, and time.     Cranial Nerves: No cranial nerve deficit.     Sensory: No sensory deficit.     Motor: No abnormal muscle tone.     Coordination: Coordination normal.  Psychiatric:        Mood and Affect: Mood normal.        Behavior: Behavior  normal.        Thought Content: Thought content normal.        Judgment: Judgment normal.    ED Results / Procedures / Treatments   Labs (all labs ordered are listed, but only abnormal results are displayed) Labs Reviewed  COMPREHENSIVE METABOLIC PANEL - Abnormal; Notable for the following components:      Result Value   Potassium 3.1 (*)    Chloride 93 (*)    Glucose, Bld 104 (*)    Calcium 10.6 (*)    Total Protein 8.6 (*)    Albumin 5.3 (*)    AST 97 (*)    ALT 135 (*)    Total Bilirubin 4.2 (*)    Anion gap 19 (*)    All other components within normal limits  CBC WITH DIFFERENTIAL/PLATELET - Abnormal; Notable for the following components:   WBC 14.4 (*)    Neutro Abs 12.2 (*)    Monocytes Absolute 1.3 (*)    Abs Immature Granulocytes 0.09 (*)    All other components within normal limits  ETHANOL  LIPASE, BLOOD  MAGNESIUM    EKG None  Radiology CT ABDOMEN PELVIS W CONTRAST  Result Date: 12/05/2021 CLINICAL DATA:  Abdominal pain with nausea and vomiting today. EXAM: CT ABDOMEN AND PELVIS WITH CONTRAST TECHNIQUE: Multidetector CT imaging of the abdomen and pelvis was performed using the standard protocol following bolus administration of intravenous contrast. RADIATION DOSE REDUCTION: This exam was performed according to the departmental dose-optimization program which includes automated exposure control, adjustment of the mA and/or kV according to patient size and/or use of iterative reconstruction technique. CONTRAST:  161mL OMNIPAQUE IOHEXOL 300 MG/ML  SOLN COMPARISON:  Abdominopelvic CT 05/29/2018. FINDINGS: Lower chest: Clear lung bases. No significant pleural or pericardial effusion. Mild distal esophageal wall thickening. Hepatobiliary: Subjective hepatic steatosis without focal abnormality. No evidence of gallstones, gallbladder wall thickening or biliary dilatation. Pancreas: Unremarkable. No pancreatic ductal dilatation or surrounding inflammatory changes. Spleen:  Normal in size without focal abnormality. Adrenals/Urinary Tract: Both adrenal glands appear normal. The kidneys appear normal without evidence of urinary tract calculus, suspicious lesion or hydronephrosis. No bladder abnormalities are seen. Stomach/Bowel: No enteric contrast was administered. The stomach appears unremarkable for its degree of distention. No evidence of small bowel distension, wall thickening or surrounding inflammation. The appendix appears normal. The colon is diffusely decompressed and suboptimally evaluated. There is mild nonspecific intramural fat deposition throughout the colon. Possible wall thickening of the proximal sigmoid colon, but no surrounding  inflammatory change. No evidence of bowel obstruction. Vascular/Lymphatic: There are no enlarged abdominal or pelvic lymph nodes. No significant vascular findings. Reproductive: The prostate gland and seminal vesicles appear unremarkable. Other: No ascites, focal extraluminal fluid collection or free air. Intact abdominal wall. Musculoskeletal: No acute or significant osseous findings. IMPRESSION: 1. Limited colonic assessment due to diffuse decompression. There is possible wall thickening of the proximal sigmoid colon which could reflect focal colitis, although no surrounding inflammatory changes are identified. The appendix appears normal. 2. Subjective hepatic steatosis. 3. Mild distal esophageal wall thickening. 4. No other significant findings identified. Electronically Signed   By: Richardean Sale M.D.   On: 12/05/2021 16:25    Procedures Procedures    Medications Ordered in ED Medications  ondansetron (ZOFRAN-ODT) disintegrating tablet 8 mg (8 mg Oral Given 12/06/21 0926)  potassium chloride SA (KLOR-CON M) CR tablet 40 mEq (40 mEq Oral Given 12/06/21 0925)  prochlorperazine (COMPAZINE) injection 10 mg (10 mg Intramuscular Given 12/06/21 0947)  pantoprazole (PROTONIX) EC tablet 40 mg (40 mg Oral Given 12/06/21 1412)    ED  Course/ Medical Decision Making/ A&P                           Medical Decision Making Patient returned shortly after being discharged and treated for the same problem which is vomiting and symptoms of alcohol withdrawal.  He had not yet filled the prescription for antiemetics, Reglan that he was prescribed.  Problems Addressed: Nausea and vomiting, unspecified vomiting type: acute illness or injury    Details: He has persistent symptoms which started yesterday.  Amount and/or Complexity of Data Reviewed Labs: ordered.    Details: I-STAT 8 indicates potassium low requiring treatment  Risk Prescription drug management. Decision regarding hospitalization. Risk Details: The patient was successfully treated with Compazine IM in the ED.  He is also treated with Protonix for symptoms of GERD, and potassium for hypokalemia..  I-STAT 8 laboratory testing ordered on second visit today, no radiography was required on the second visit within 24 hours.  Prescriptions written at time of discharge.  No indication for hospitalization since his condition has improved.           Final Clinical Impression(s) / ED Diagnoses Final diagnoses:  Nausea and vomiting, unspecified vomiting type    Rx / DC Orders ED Discharge Orders          Ordered    pantoprazole (PROTONIX) 40 MG tablet  Daily        12/06/21 1422              Daleen Bo, MD 12/06/21 Curly Rim

## 2021-12-06 NOTE — Discharge Instructions (Addendum)
You were seen here today for evaluation of your nausea, vomiting, and diarrhea. These symptoms were likely due to your drinking. Your liver enzymes are elevated and there is some esophageal wall thickening.  I given you information for a GI doctor that she will need to see soon for evaluation of this.  Please call them tomorrow to schedule an appointment.  In the meantime, consider stopping your alcohol use.  Resources for substance use attached to this discharge document.  If you start throwing up bright red blood, have black stools, feel lightheaded, or any fever, please return to the nearest emergency department for reevaluation.

## 2022-01-13 ENCOUNTER — Emergency Department (HOSPITAL_COMMUNITY): Payer: Self-pay

## 2022-01-13 ENCOUNTER — Encounter (HOSPITAL_COMMUNITY): Payer: Self-pay

## 2022-01-13 ENCOUNTER — Emergency Department (HOSPITAL_COMMUNITY)
Admission: EM | Admit: 2022-01-13 | Discharge: 2022-01-13 | Disposition: A | Discharge status: Home / Self Care | Payer: Self-pay | Attending: Emergency Medicine | Admitting: Emergency Medicine

## 2022-01-13 ENCOUNTER — Other Ambulatory Visit: Payer: Self-pay

## 2022-01-13 DIAGNOSIS — W230XXA Caught, crushed, jammed, or pinched between moving objects, initial encounter: Secondary | ICD-10-CM | POA: Insufficient documentation

## 2022-01-13 DIAGNOSIS — L02511 Cutaneous abscess of right hand: Secondary | ICD-10-CM | POA: Insufficient documentation

## 2022-01-13 DIAGNOSIS — S6991XA Unspecified injury of right wrist, hand and finger(s), initial encounter: Secondary | ICD-10-CM | POA: Insufficient documentation

## 2022-01-13 DIAGNOSIS — Z23 Encounter for immunization: Secondary | ICD-10-CM | POA: Insufficient documentation

## 2022-01-13 MED ORDER — DOXYCYCLINE HYCLATE 100 MG PO CAPS: 100.0000 mg | ORAL_CAPSULE | Freq: Two times a day (BID) | ORAL | 0 refills | Status: DC

## 2022-01-13 MED ORDER — LIDOCAINE HCL (PF) 1 % IJ SOLN
10.0000 mL | Freq: Once | INTRAMUSCULAR | Status: AC
  Administered 2022-01-13: 10 mL
  Filled 2022-01-13: qty 10

## 2022-01-13 MED ORDER — TETANUS-DIPHTH-ACELL PERTUSSIS 5-2.5-18.5 LF-MCG/0.5 IM SUSY
0.5000 mL | PREFILLED_SYRINGE | Freq: Once | INTRAMUSCULAR | Status: AC
  Administered 2022-01-13: 0.5 mL via INTRAMUSCULAR
  Filled 2022-01-13: qty 0.5

## 2022-01-13 NOTE — ED Provider Notes (Signed)
°  Face-to-face evaluation   History: Presenting with subacute injury, 5 days ago when he excellently shot his finger in the door of a car.  Since then gradually worse pain and swelling has been noted in the right finger 2.  Physical exam: Alert, calm, cooperative.  Right finger 2, tender and swollen, without pustule over the volar or DIP joint.  This appears to be superficial infection with surrounding erythema.  He is unable to touch the second fingertip to the palm secondary to pain and swelling  MDM: Evaluation for  Chief Complaint  Patient presents with   Finger Injury     With evidence for superficial abscess requiring drainage procedure in the ED.  Doubt deep tissue infection, or flexor tenosynovitis.  Medical screening examination/treatment/procedure(s) were conducted as a shared visit with non-physician practitioner(s) and myself.  I personally evaluated the patient during the encounter    Mancel Bale, MD 01/16/22 [id number]

## 2022-01-13 NOTE — ED Provider Notes (Signed)
[name] EMERGENCY DEPARTMENT Provider Note   CSN: [id number] Arrival date & time: 01/13/22  1011     History  Chief Complaint  Patient presents with   Finger Injury    Timothy [name] is a 26 y.o. male.  Timothy [name] is a 26 y.o. male who is otherwise healthy, presents to the ED for evaluation of pain and swelling to the right index finger.  Patient reports about 4-5 days ago. He thinks he slammed it in a car door and jammed it. He was having swelling and increased pain primarily over the DIP joint and now cannot bend that joint.  No fevers or chills.  He does not remember seeing a cup or any wound when he initially injured the finger, but is not sure.  No other aggravating or alleviating factors.  The history is provided by the patient.      Home Medications Prior to Admission medications   Medication Sig Start Date End Date Taking? Authorizing Provider  albuterol (VENTOLIN HFA) 108 (90 Base) MCG/ACT inhaler Inhale 2 puffs into the lungs every 6 (six) hours as needed for wheezing. 08/28/21   Shon Hale, MD  chlordiazePOXIDE (LIBRIUM) 10 MG capsule Take 1 capsule (10 mg total) by mouth See admin instructions. Take Chlordiazepoxide/Librium 2 capsules Three times daily for 3 days, then 1 capsule three times a day for 2 days and STOP Patient not taking: Reported on 10/14/2021 08/28/21   Shon Hale, MD  folic acid (FOLVITE) 1 MG tablet Take 1 tablet (1 mg total) by mouth daily. 08/29/21   Shon Hale, MD  LORazepam (ATIVAN) 1 MG tablet Take 1 tablet (1 mg total) by mouth every 6 (six) hours as needed (Nausea, shakiness). 10/14/21   Mancel Bale, MD  metoCLOPramide (REGLAN) 10 MG tablet Take 1 tablet (10 mg total) by mouth every 6 (six) hours. 12/06/21   Achille Rich, PA-C  Multiple Vitamin (MULTIVITAMIN WITH MINERALS) TABS tablet Take 1 tablet by mouth daily. 08/29/21   Shon Hale, MD  omeprazole (PRILOSEC OTC) 20 MG tablet Take 1 tablet (20 mg total) by mouth  daily. 08/28/21 08/28/22  Shon Hale, MD  ondansetron (ZOFRAN ODT) 4 MG disintegrating tablet Take 1 tablet (4 mg total) by mouth every 8 (eight) hours as needed for nausea or vomiting. Patient not taking: Reported on 10/14/2021 08/28/21   Shon Hale, MD  ondansetron (ZOFRAN) 4 MG tablet Take 1 tablet (4 mg total) by mouth every 8 (eight) hours as needed for nausea or vomiting. Patient not taking: Reported on 10/14/2021 10/09/21   Horton, Clabe Seal, DO  pantoprazole (PROTONIX) 40 MG tablet Take 1 tablet (40 mg total) by mouth daily. 12/06/21   Mancel Bale, MD  thiamine 100 MG tablet Take 1 tablet (100 mg total) by mouth daily. 08/29/21   Shon Hale, MD      Allergies    Amoxil [amoxicillin]    Review of Systems   Review of Systems  Constitutional:  Negative for chills and fever.  Musculoskeletal:  Positive for arthralgias and joint swelling.  Skin:  Positive for color change.   Physical Exam Updated Vital Signs BP (!) 134/91 (BP Location: Right Arm)    Pulse (!) 107    Temp 98.2 F (36.8 C) (Oral)    Resp 17    Ht 5\' 4"  (1.626 m)    Wt 74.8 kg    SpO2 100%    BMI 28.32 kg/m  Physical Exam Vitals and nursing note reviewed.  Constitutional:  General: He is not in acute distress.    Appearance: Normal appearance. He is well-developed. He is not ill-appearing or diaphoretic.  HENT:     Head: Normocephalic and atraumatic.  Eyes:     General:        Right eye: No discharge.        Left eye: No discharge.  Pulmonary:     Effort: Pulmonary effort is normal. No respiratory distress.  Musculoskeletal:     Comments: Right index finger with focal swelling over the DIP joint with what appears to be a superficial abscess or blister over the palmar surface of the DIP joint as pictured below.  Patient's pain and tenderness does not extend beyond this area of discoloration and patient has full flexion and extension at the PIP joint  Neurological:     Mental Status: He is  alert and oriented to person, place, and time.     Coordination: Coordination normal.  Psychiatric:        Mood and Affect: Mood normal.        Behavior: Behavior normal.     ED Results / Procedures / Treatments   Labs (all labs ordered are listed, but only abnormal results are displayed) Labs Reviewed - No data to display  EKG None  Radiology DG Finger Index Right  Result Date: 01/13/2022 CLINICAL DATA:  Right index finger pain EXAM: RIGHT INDEX FINGER 2+V COMPARISON:  None. FINDINGS: No acute fracture or dislocation. No aggressive osseous lesion. Normal alignment. Soft tissue swelling along the volar aspect of the second digit. No radiopaque foreign body or soft tissue emphysema. IMPRESSION: 1. No acute osseous injury of the right second digit. Electronically Signed   By: Elige Ko M.D.   On: 01/13/2022 10:57    Procedures .Marland KitchenIncision and Drainage  Date/Time: 01/14/2022 12:49 PM Performed by: Dartha Lodge, PA-C Authorized by: Dartha Lodge, PA-C   Consent:    Consent obtained:  Verbal   Consent given by:  Patient   Risks discussed:  Bleeding, incomplete drainage, pain, infection and damage to other organs   Alternatives discussed:  No treatment and alternative treatment Universal protocol:    Procedure explained and questions answered to patient or proxy's satisfaction: yes     Patient identity confirmed:  Verbally with patient Location:    Type:  Abscess   Size:  2 x 2.5 cm   Location:  Upper extremity   Upper extremity location:  Finger   Finger location:  R index finger Pre-procedure details:    Skin preparation:  Chlorhexidine with alcohol Sedation:    Sedation type:  None Anesthesia:    Anesthesia method:  Nerve block   Block needle gauge:  25 G   Block anesthetic:  Lidocaine 1% w/o epi   Block injection procedure:  Negative aspiration for blood, introduced needle, incremental injection and anatomic landmarks identified   Block outcome:  Anesthesia  achieved Procedure type:    Complexity:  Simple Procedure details:    Ultrasound guidance: no     Needle aspiration: no     Incision types:  Single straight   Incision depth:  Dermal   Wound management:  Irrigated with saline and probed and deloculated   Drainage:  Purulent   Drainage amount:  Copious   Wound treatment:  Wound left open   Packing materials:  None Post-procedure details:    Procedure completion:  Tolerated well, no immediate complications    Medications Ordered in ED Medications  lidocaine (  PF) (XYLOCAINE) 1 % injection 10 mL (10 mLs Infiltration Given by Other 01/13/22 1215)  Tdap (BOOSTRIX) injection 0.5 mL (0.5 mLs Intramuscular Given 01/13/22 1139)    ED Course/ Medical Decision Making/ A&P                           26 year old male presents with swelling and pain to the right index finger, thinks he injured it with a car door about 5 days ago, did not initially note any wounds but now has a white area of discoloration over the palmar surface of the DIP joint.  Unable to flex or extend the finger at the DIP joint due to swelling.  Imaging: I personally ordered, visualized and interpreted imaging including x-rays of the right index finger, which show no evidence of fracture or foreign body  Patient appears to have a very superficial abscess, question whether this may have initially started as a blister, over the palmar surface of the DIP joint, this does not appear to involve the digit pad or extend further down the finger and patient does not have focal bony tenderness over the joint.  Feel this is amenable to bedside I&D.  Digital block performed and patient underwent I&D with successful drainage of purulent fluid, and resolution of patient's pain patient now able to flex and extend the finger.  Given that patient has normal range of motion and swelling is not extending down the finger doubt flexor tenosynovitis.  Will place on antibiotics and encourage patient to  do washes and warm compresses several times a day.  Strict return precautions provided and patient to return in 2 days for wound check.  He expresses understanding and agreement.  Discharged home in good condition.        Final Clinical Impression(s) / ED Diagnoses Final diagnoses:  Abscess of finger of right hand    Rx / DC Orders ED Discharge Orders          Ordered    doxycycline (VIBRAMYCIN) 100 MG capsule  2 times daily        01/13/22 1236              Dartha Lodge, [place] 01/14/22 1329    Mancel Bale, MD 01/16/22 [phone]

## 2022-01-13 NOTE — ED Triage Notes (Signed)
Pt presents to ED states right index finger is jammed for the last 4-5 days, can't remember injury

## 2022-01-13 NOTE — Discharge Instructions (Signed)
You were seen in the emergency department for an abscess- please see the attached handout for further information regarding this diagnoses. This area was incised and drained to help release the bacteria. We would like you to apply warm compresses and warm flushes to this area 3-4 times per day to help facilitate further draining as needed. We are also starting you on doxycycline, an antibiotic, in order to help treat the infection.   We have prescribed you new medication(s) today. Discuss the medications prescribed today with your pharmacist as they can have adverse effects and interactions with your other medicines including over the counter and prescribed medications. Seek medical evaluation if you start to experience new or abnormal symptoms after taking one of these medicines, seek care immediately if you start to experience difficulty breathing, feeling of your throat closing, facial swelling, or rash as these could be indications of a more serious allergic reaction  We would like you to have this area rechecked within 48 hours- please return to the ER , go to an urgent care, or see your primary care provider for this. Return to the ER sooner for new or worsening symptoms including, but not limited to increased pain, spreading redness, fevers, inability to keep fluids down, or any other concerns that you may have.

## 2022-03-22 ENCOUNTER — Encounter (HOSPITAL_COMMUNITY): Payer: Self-pay | Admitting: Emergency Medicine

## 2022-03-22 ENCOUNTER — Other Ambulatory Visit: Payer: Self-pay

## 2022-03-22 ENCOUNTER — Emergency Department (HOSPITAL_COMMUNITY)
Admission: EM | Admit: 2022-03-22 | Discharge: 2022-03-22 | Disposition: A | Discharge status: Home / Self Care | Payer: Medicaid Other | Attending: Emergency Medicine | Admitting: Emergency Medicine

## 2022-03-22 DIAGNOSIS — R112 Nausea with vomiting, unspecified: Secondary | ICD-10-CM

## 2022-03-22 DIAGNOSIS — R1084 Generalized abdominal pain: Secondary | ICD-10-CM

## 2022-03-22 DIAGNOSIS — R748 Abnormal levels of other serum enzymes: Secondary | ICD-10-CM | POA: Insufficient documentation

## 2022-03-22 DIAGNOSIS — Y900 Blood alcohol level of less than 20 mg/100 ml: Secondary | ICD-10-CM | POA: Insufficient documentation

## 2022-03-22 DIAGNOSIS — D72829 Elevated white blood cell count, unspecified: Secondary | ICD-10-CM | POA: Insufficient documentation

## 2022-03-22 DIAGNOSIS — F1010 Alcohol abuse, uncomplicated: Secondary | ICD-10-CM | POA: Insufficient documentation

## 2022-03-22 DIAGNOSIS — R1110 Vomiting, unspecified: Secondary | ICD-10-CM | POA: Insufficient documentation

## 2022-03-22 DIAGNOSIS — R7401 Elevation of levels of liver transaminase levels: Secondary | ICD-10-CM | POA: Insufficient documentation

## 2022-03-22 DIAGNOSIS — F12188 Cannabis abuse with other cannabis-induced disorder: Secondary | ICD-10-CM | POA: Insufficient documentation

## 2022-03-22 DIAGNOSIS — K2920 Alcoholic gastritis without bleeding: Secondary | ICD-10-CM | POA: Insufficient documentation

## 2022-03-22 LAB — CBC
HCT: 49.2 % (ref 39.0–52.0)
Hemoglobin: 16.3 g/dL (ref 13.0–17.0)
MCH: 32.4 pg (ref 26.0–34.0)
MCHC: 33.1 g/dL (ref 30.0–36.0)
MCV: 97.8 fL (ref 80.0–100.0)
Platelets: 375 10*3/uL (ref 150–400)
RBC: 5.03 MIL/uL (ref 4.22–5.81)
RDW: 13.7 % (ref 11.5–15.5)
WBC: 15.6 10*3/uL — ABNORMAL HIGH (ref 4.0–10.5)
nRBC: 0 % (ref 0.0–0.2)

## 2022-03-22 LAB — COMPREHENSIVE METABOLIC PANEL
ALT: 34 U/L (ref 0–44)
AST: 47 U/L — ABNORMAL HIGH (ref 15–41)
Albumin: 5.4 g/dL — ABNORMAL HIGH (ref 3.5–5.0)
Alkaline Phosphatase: 142 U/L — ABNORMAL HIGH (ref 38–126)
Anion gap: 16 — ABNORMAL HIGH (ref 5–15)
BUN: 8 mg/dL (ref 6–20)
CO2: 24 mmol/L (ref 22–32)
Calcium: 10.1 mg/dL (ref 8.9–10.3)
Chloride: 101 mmol/L (ref 98–111)
Creatinine, Ser: 0.77 mg/dL (ref 0.61–1.24)
GFR, Estimated: >60 mL/min (ref >60)
Glucose, Bld: 97 mg/dL (ref 70–99)
Potassium: 3.6 mmol/L (ref 3.5–5.1)
Sodium: 141 mmol/L (ref 135–145)
Total Bilirubin: 1.3 mg/dL — ABNORMAL HIGH (ref 0.3–1.2)
Total Protein: 9.3 g/dL — ABNORMAL HIGH (ref 6.5–8.1)

## 2022-03-22 LAB — LIPASE, BLOOD: Lipase: 15 U/L (ref 11–51)

## 2022-03-22 LAB — ETHANOL: Alcohol, Ethyl (B): <10 mg/dL (ref <10)

## 2022-03-22 MED ORDER — LORAZEPAM 2 MG/ML IJ SOLN
1.0000 mg | Freq: Once | INTRAMUSCULAR | Status: AC
  Administered 2022-03-22: 1 mg via INTRAVENOUS
  Filled 2022-03-22: qty 1

## 2022-03-22 MED ORDER — ONDANSETRON 4 MG PO TBDP: 4.0000 mg | ORAL_TABLET | Freq: Three times a day (TID) | ORAL | 0 refills | Status: DC | PRN

## 2022-03-22 MED ORDER — ONDANSETRON 4 MG PO TBDP
4.0000 mg | ORAL_TABLET | Freq: Once | ORAL | Status: AC | PRN
  Administered 2022-03-22: 4 mg via ORAL
  Filled 2022-03-22: qty 1

## 2022-03-22 MED ORDER — LACTATED RINGERS IV BOLUS
1000.0000 mL | Freq: Once | INTRAVENOUS | Status: AC
  Administered 2022-03-22: 1000 mL via INTRAVENOUS

## 2022-03-22 MED ORDER — PANTOPRAZOLE SODIUM 40 MG IV SOLR
40.0000 mg | Freq: Once | INTRAVENOUS | Status: AC
Start: 2022-03-22 — End: 2022-03-22
  Administered 2022-03-22: 40 mg via INTRAVENOUS
  Filled 2022-03-22: qty 10

## 2022-03-22 MED ORDER — METOCLOPRAMIDE HCL 5 MG/ML IJ SOLN
10.0000 mg | Freq: Once | INTRAMUSCULAR | Status: AC
Start: 2022-03-22 — End: 2022-03-22
  Administered 2022-03-22: 10 mg via INTRAVENOUS
  Filled 2022-03-22: qty 2

## 2022-03-22 NOTE — ED Triage Notes (Signed)
Pt having abdominal pain and vomiting since 930 am, history of Cannabis use. ?

## 2022-03-22 NOTE — ED Notes (Signed)
Patient nauseated, vomited x 2, clear fluids. Will notify EDP ?

## 2022-03-22 NOTE — ED Notes (Signed)
Patient unable to provide urine specimen

## 2022-03-22 NOTE — ED Notes (Signed)
Patient resting in bed, no signs of distress. Pads remain in place on side rails. ?

## 2022-03-22 NOTE — ED Provider Notes (Signed)
?[facility] ?Provider Note ? ? ?CSN: [id number] ?Arrival date & time: 03/22/22  1447 ? ?  ? ?History ? ?Chief Complaint  ?Patient presents with  ? Abdominal Pain  ? ? ?Timothy [name] is a 26 y.o. male who presents today for evaluation of abdominal pain.  He reports that he drinks a large amount of alcohol and smokes cannabis every day.  He began feeling poorly this morning.  His last drink was early this morning.  He has vomited multiple times.  He was given Zofran in triage which he reports has not helped him feel better.  He does not have specific localized abdominal pain where there is "sore all over." ? ?HPI ? ?  ? ?Home Medications ?Prior to Admission medications   ?Medication Sig Start Date End Date Taking? Authorizing Provider  ?ondansetron (ZOFRAN-ODT) 4 MG disintegrating tablet Take 1 tablet (4 mg total) by mouth every 8 (eight) hours as needed for nausea or vomiting. 03/22/22  Yes Lorin Glass, PA-C  ?albuterol (VENTOLIN HFA) 108 (90 Base) MCG/ACT inhaler Inhale 2 puffs into the lungs every 6 (six) hours as needed for wheezing. 08/28/21   Roxan Hockey, MD  ?chlordiazePOXIDE (LIBRIUM) 10 MG capsule Take 1 capsule (10 mg total) by mouth See admin instructions. Take Chlordiazepoxide/Librium 2 capsules Three times daily for 3 days, then 1 capsule three times a day for 2 days and STOP ?Patient not taking: Reported on 10/14/2021 08/28/21   Roxan Hockey, MD  ?doxycycline (VIBRAMYCIN) 100 MG capsule Take 1 capsule (100 mg total) by mouth 2 (two) times daily. One po bid x 7 days 01/13/22   Jacqlyn Larsen, PA-C  ?folic acid (FOLVITE) 1 MG tablet Take 1 tablet (1 mg total) by mouth daily. 08/29/21   Roxan Hockey, MD  ?LORazepam (ATIVAN) 1 MG tablet Take 1 tablet (1 mg total) by mouth every 6 (six) hours as needed (Nausea, shakiness). 10/14/21   Daleen Bo, MD  ?metoCLOPramide (REGLAN) 10 MG tablet Take 1 tablet (10 mg total) by mouth every 6 (six) hours. 12/06/21   Sherrell Puller,  PA-C  ?Multiple Vitamin (MULTIVITAMIN WITH MINERALS) TABS tablet Take 1 tablet by mouth daily. 08/29/21   Roxan Hockey, MD  ?omeprazole (PRILOSEC OTC) 20 MG tablet Take 1 tablet (20 mg total) by mouth daily. 08/28/21 08/28/22  Roxan Hockey, MD  ?pantoprazole (PROTONIX) 40 MG tablet Take 1 tablet (40 mg total) by mouth daily. 12/06/21   Daleen Bo, MD  ?thiamine 100 MG tablet Take 1 tablet (100 mg total) by mouth daily. 08/29/21   Roxan Hockey, MD  ?   ? ?Allergies    ?Amoxil [amoxicillin]   ? ?Review of Systems   ?Review of Systems ?See above ? ?Physical Exam ?Updated Vital Signs ?BP 112/71 (BP Location: Right Arm)   Pulse 100   Temp 98.1 ?F (36.7 ?C) (Oral)   Resp 18   Ht 5' 4"  (1.626 m)   Wt 72.6 kg   SpO2 98%   BMI 27.46 kg/m?  ?Physical Exam ?Vitals and nursing note reviewed.  ?Constitutional:   ?   General: He is not in acute distress. ?   Appearance: He is not diaphoretic.  ?HENT:  ?   Head: Normocephalic and atraumatic.  ?Eyes:  ?   General: No scleral icterus.    ?   Right eye: No discharge.     ?   Left eye: No discharge.  ?   Conjunctiva/sclera: Conjunctivae normal.  ?Cardiovascular:  ?  Rate and Rhythm: Normal rate and regular rhythm.  ?Pulmonary:  ?   Effort: Pulmonary effort is normal. No respiratory distress.  ?   Breath sounds: No stridor.  ?Abdominal:  ?   General: There is no distension.  ?   Palpations: Abdomen is soft.  ?   Tenderness: There is generalized abdominal tenderness.  ?Musculoskeletal:     ?   General: No deformity.  ?   Cervical back: Normal range of motion.  ?Skin: ?   General: Skin is warm and dry.  ?Neurological:  ?   General: No focal deficit present.  ?   Mental Status: He is alert.  ?   Motor: No abnormal muscle tone.  ?   Comments: No tremor  ?Psychiatric:     ?   Behavior: Behavior normal.  ? ? ?ED Results / Procedures / Treatments   ?Labs ?(all labs ordered are listed, but only abnormal results are displayed) ?Labs Reviewed  ?COMPREHENSIVE METABOLIC PANEL  - Abnormal; Notable for the following components:  ?    Result Value  ? Total Protein 9.3 (*)   ? Albumin 5.4 (*)   ? AST 47 (*)   ? Alkaline Phosphatase 142 (*)   ? Total Bilirubin 1.3 (*)   ? Anion gap 16 (*)   ? All other components within normal limits  ?CBC - Abnormal; Notable for the following components:  ? WBC 15.6 (*)   ? All other components within normal limits  ?LIPASE, BLOOD  ?ETHANOL  ?URINALYSIS, ROUTINE W REFLEX MICROSCOPIC  ? ? ?EKG ?None ? ?Radiology ?No results found. ? ?Procedures ?Procedures  ? ? ?Medications Ordered in ED ?Medications  ?ondansetron (ZOFRAN-ODT) disintegrating tablet 4 mg (4 mg Oral Given 03/22/22 1527)  ?lactated ringers bolus 1,000 mL (0 mLs Intravenous Stopped 03/22/22 1919)  ?LORazepam (ATIVAN) injection 1 mg (1 mg Intravenous Given 03/22/22 1828)  ?pantoprazole (PROTONIX) injection 40 mg (40 mg Intravenous Given 03/22/22 1832)  ?metoCLOPramide (REGLAN) injection 10 mg (10 mg Intravenous Given 03/22/22 1830)  ? ? ?ED Course/ Medical Decision Making/ A&P ?  ?                        ?Medical Decision Making ?Patient is a 26 year old man who presents today for evaluation of emesis and abdominal pain. ?On initial exam he appears to not feel well. ?He does admit to frequent heavy marijuana use noting that he smokes every day and he drinks alcohol in excess every day. ?Clinically suspect alcohol gastritis and cannabinoid hyperemesis syndrome. ?He is treated with ODT Zofran without relief in his symptoms. ?He is given IV Ativan, 1 L of IV fluids, IV Protonix, and IV Reglan after which his symptoms have markedly improved and he felt much better.  His abdominal pain resolved.  He was able to tolerate p.o. intake without difficulties and stated that he felt ready to go home. ?On repeat exam he does not have a surgical abdomen. ? ? ?Problems Addressed: ?Acute alcoholic gastritis, presence of bleeding unspecified: complicated acute illness or injury ?Cannabinoid hyperemesis syndrome: acute  illness or injury with systemic symptoms ?Generalized abdominal pain: acute illness or injury ? ?Amount and/or Complexity of Data Reviewed ?External Data Reviewed: notes. ?Labs: ordered. ?   Details: CMP shows AST, alk phos, and total bili are all elevated, appear either consistent with baseline or improved.  Lipase is not elevated.  Ethanol is undetected.  Patient has mild leukocytosis at 15.6 which does not  appear new. ?He does have a slight anion gap at 16 however this appears consistent with his previous labs. ? ?Risk ?OTC drugs. ?Prescription drug management. ?Parenteral controlled substances. ?Decision regarding hospitalization. ? ?Return precautions were discussed with patient who states their understanding.  At the time of discharge patient denied any unaddressed complaints or concerns.  Patient is agreeable for discharge home. ? ?Note: Portions of this report may have been transcribed using voice recognition software. Every effort was made to ensure accuracy; however, inadvertent computerized transcription errors may be present ? ? ? ? ? ? ? ? ?Final Clinical Impression(s) / ED Diagnoses ?Final diagnoses:  ?Generalized abdominal pain  ?Cannabinoid hyperemesis syndrome  ?Acute alcoholic gastritis, presence of bleeding unspecified  ? ? ?Rx / DC Orders ?ED Discharge Orders   ? ?      Ordered  ?  ondansetron (ZOFRAN-ODT) 4 MG disintegrating tablet  Every 8 hours PRN       ? 03/22/22 2205  ? ?  ?  ? ?  ? ? ?  ?Lorin Glass, [place] ?03/22/22 2228 ? ?  ?Sherwood Gambler, MD ?03/23/22 [phone] ? ?

## 2022-03-22 NOTE — Discharge Instructions (Addendum)
Today you received medications that may make you sleepy or impair your ability to make decisions.  For the next 24 hours please do not drive, operate heavy machinery, care for a small child with out another adult present, or perform any activities that may cause harm to you or someone else if you were to fall asleep or be impaired.   

## 2022-04-10 ENCOUNTER — Encounter (HOSPITAL_COMMUNITY): Payer: Self-pay | Admitting: *Deleted

## 2022-04-10 ENCOUNTER — Other Ambulatory Visit: Payer: Self-pay

## 2022-04-10 ENCOUNTER — Emergency Department (HOSPITAL_COMMUNITY)
Admission: EM | Admit: 2022-04-10 | Discharge: 2022-04-10 | Disposition: A | Discharge status: Home / Self Care | Payer: Self-pay | Attending: Emergency Medicine | Admitting: Emergency Medicine

## 2022-04-10 DIAGNOSIS — F12188 Cannabis abuse with other cannabis-induced disorder: Secondary | ICD-10-CM | POA: Insufficient documentation

## 2022-04-10 DIAGNOSIS — R112 Nausea with vomiting, unspecified: Secondary | ICD-10-CM

## 2022-04-10 LAB — CBC WITH DIFFERENTIAL/PLATELET
Abs Immature Granulocytes: 0.08 10*3/uL — ABNORMAL HIGH (ref 0.00–0.07)
Basophils Absolute: 0.1 10*3/uL (ref 0.0–0.1)
Basophils Relative: 1 %
Eosinophils Absolute: 0 10*3/uL (ref 0.0–0.5)
Eosinophils Relative: 0 %
HCT: 48.1 % (ref 39.0–52.0)
Hemoglobin: 16.7 g/dL (ref 13.0–17.0)
Immature Granulocytes: 1 %
Lymphocytes Relative: 8 %
Lymphs Abs: 1.3 10*3/uL (ref 0.7–4.0)
MCH: 32.9 pg (ref 26.0–34.0)
MCHC: 34.7 g/dL (ref 30.0–36.0)
MCV: 94.7 fL (ref 80.0–100.0)
Monocytes Absolute: 1 10*3/uL (ref 0.1–1.0)
Monocytes Relative: 7 %
Neutro Abs: 12.7 10*3/uL — ABNORMAL HIGH (ref 1.7–7.7)
Neutrophils Relative %: 83 %
Platelets: 404 10*3/uL — ABNORMAL HIGH (ref 150–400)
RBC: 5.08 MIL/uL (ref 4.22–5.81)
RDW: 13.2 % (ref 11.5–15.5)
WBC: 15.1 10*3/uL — ABNORMAL HIGH (ref 4.0–10.5)
nRBC: 0 % (ref 0.0–0.2)

## 2022-04-10 LAB — COMPREHENSIVE METABOLIC PANEL
ALT: 45 U/L — ABNORMAL HIGH (ref 0–44)
AST: 70 U/L — ABNORMAL HIGH (ref 15–41)
Albumin: 5.5 g/dL — ABNORMAL HIGH (ref 3.5–5.0)
Alkaline Phosphatase: 123 U/L (ref 38–126)
Anion gap: 14 (ref 5–15)
BUN: 7 mg/dL (ref 6–20)
CO2: 24 mmol/L (ref 22–32)
Calcium: 10.1 mg/dL (ref 8.9–10.3)
Chloride: 102 mmol/L (ref 98–111)
Creatinine, Ser: 0.81 mg/dL (ref 0.61–1.24)
GFR, Estimated: >60 mL/min (ref >60)
Glucose, Bld: 109 mg/dL — ABNORMAL HIGH (ref 70–99)
Potassium: 3.1 mmol/L — ABNORMAL LOW (ref 3.5–5.1)
Sodium: 140 mmol/L (ref 135–145)
Total Bilirubin: 1.3 mg/dL — ABNORMAL HIGH (ref 0.3–1.2)
Total Protein: 9 g/dL — ABNORMAL HIGH (ref 6.5–8.1)

## 2022-04-10 LAB — LIPASE, BLOOD: Lipase: 17 U/L (ref 11–51)

## 2022-04-10 MED ORDER — SODIUM CHLORIDE 0.9 % IV BOLUS
1000.0000 mL | Freq: Once | INTRAVENOUS | Status: AC
  Administered 2022-04-10: 1000 mL via INTRAVENOUS

## 2022-04-10 MED ORDER — DIPHENHYDRAMINE HCL 50 MG/ML IJ SOLN
12.5000 mg | Freq: Once | INTRAMUSCULAR | Status: AC
  Administered 2022-04-10: 12.5 mg via INTRAVENOUS
  Filled 2022-04-10: qty 1

## 2022-04-10 MED ORDER — ONDANSETRON HCL 4 MG PO TABS: 4.0000 mg | ORAL_TABLET | Freq: Four times a day (QID) | ORAL | 0 refills | Status: DC

## 2022-04-10 MED ORDER — METOCLOPRAMIDE HCL 5 MG/ML IJ SOLN
10.0000 mg | Freq: Once | INTRAMUSCULAR | Status: AC
  Administered 2022-04-10: 10 mg via INTRAVENOUS
  Filled 2022-04-10: qty 2

## 2022-04-10 MED ORDER — HALOPERIDOL LACTATE 5 MG/ML IJ SOLN
1.0000 mg | Freq: Once | INTRAMUSCULAR | Status: AC
  Administered 2022-04-10: 1 mg via INTRAVENOUS
  Filled 2022-04-10: qty 1

## 2022-04-10 MED ORDER — ONDANSETRON HCL 4 MG PO TABS
4.0000 mg | ORAL_TABLET | Freq: Four times a day (QID) | ORAL | 0 refills | Status: DC
Start: 2022-04-10 — End: 2022-08-01

## 2022-04-10 MED ORDER — POTASSIUM CHLORIDE CRYS ER 20 MEQ PO TBCR
40.0000 meq | EXTENDED_RELEASE_TABLET | Freq: Once | ORAL | Status: AC
  Administered 2022-04-10: 40 meq via ORAL
  Filled 2022-04-10: qty 2

## 2022-04-10 NOTE — ED Provider Notes (Signed)
[facility] EMERGENCY DEPARTMENT Provider Note   CSN: [id number] Arrival date & time: 04/10/22  1213     History  Chief Complaint  Patient presents with   Abdominal Pain    Timothy [name] is a 26 y.o. male.  Patient is a 26 year old male with past medical history of alcohol abuse and daily marijuana use presenting for complaints of nausea, vomiting, and generalized abdominal pain x2 hours.  Patient states he is unable to stop vomiting at this time.  Denies any any emesis.  Denies any fevers or chills, body aches, or diarrhea.  Chart review demonstrates patient has been to emergency department presenting with the similar symptoms 3 times in the past 4 months.  States he is still drinking alcohol and smoking marijuana daily.  Denies any attempts to stop or withdrawal symptoms at this time.  The history is provided by the patient. No language interpreter was used.  Abdominal Pain Associated symptoms: nausea and vomiting   Associated symptoms: no chest pain, no chills, no cough, no dysuria, no fever, no hematuria, no shortness of breath and no sore throat       Home Medications Prior to Admission medications   Medication Sig Start Date End Date Taking? Authorizing Provider  ondansetron (ZOFRAN) 4 MG tablet Take 1 tablet (4 mg total) by mouth every 6 (six) hours. 04/10/22  Yes Edwin Dada P, DO  albuterol (VENTOLIN HFA) 108 (90 Base) MCG/ACT inhaler Inhale 2 puffs into the lungs every 6 (six) hours as needed for wheezing. 08/28/21   Shon Hale, MD  chlordiazePOXIDE (LIBRIUM) 10 MG capsule Take 1 capsule (10 mg total) by mouth See admin instructions. Take Chlordiazepoxide/Librium 2 capsules Three times daily for 3 days, then 1 capsule three times a day for 2 days and STOP Patient not taking: Reported on 10/14/2021 08/28/21   Shon Hale, MD  doxycycline (VIBRAMYCIN) 100 MG capsule Take 1 capsule (100 mg total) by mouth 2 (two) times daily. One po bid x 7 days 01/13/22   Dartha Lodge, PA-C  folic acid (FOLVITE) 1 MG tablet Take 1 tablet (1 mg total) by mouth daily. 08/29/21   Shon Hale, MD  LORazepam (ATIVAN) 1 MG tablet Take 1 tablet (1 mg total) by mouth every 6 (six) hours as needed (Nausea, shakiness). 10/14/21   Mancel Bale, MD  metoCLOPramide (REGLAN) 10 MG tablet Take 1 tablet (10 mg total) by mouth every 6 (six) hours. 12/06/21   Achille Rich, PA-C  Multiple Vitamin (MULTIVITAMIN WITH MINERALS) TABS tablet Take 1 tablet by mouth daily. 08/29/21   Shon Hale, MD  omeprazole (PRILOSEC OTC) 20 MG tablet Take 1 tablet (20 mg total) by mouth daily. 08/28/21 08/28/22  Shon Hale, MD  ondansetron (ZOFRAN-ODT) 4 MG disintegrating tablet Take 1 tablet (4 mg total) by mouth every 8 (eight) hours as needed for nausea or vomiting. 03/22/22   Cristina Gong, PA-C  pantoprazole (PROTONIX) 40 MG tablet Take 1 tablet (40 mg total) by mouth daily. 12/06/21   Mancel Bale, MD  thiamine 100 MG tablet Take 1 tablet (100 mg total) by mouth daily. 08/29/21   Shon Hale, MD      Allergies    Amoxil [amoxicillin]    Review of Systems   Review of Systems  Constitutional:  Negative for chills and fever.  HENT:  Negative for ear pain and sore throat.   Eyes:  Negative for pain and visual disturbance.  Respiratory:  Negative for cough and shortness of breath.  Cardiovascular:  Negative for chest pain and palpitations.  Gastrointestinal:  Positive for abdominal pain, nausea and vomiting.  Genitourinary:  Negative for dysuria and hematuria.  Musculoskeletal:  Negative for arthralgias and back pain.  Skin:  Negative for color change and rash.  Neurological:  Negative for seizures and syncope.  All other systems reviewed and are negative.  Physical Exam Updated Vital Signs BP 130/86   Pulse 74   Temp (!) 97.4 F (36.3 C) (Oral)   Resp 18   Ht 5\' 4"  (1.626 m)   Wt 72 kg   SpO2 100%   BMI 27.25 kg/m  Physical Exam Vitals and nursing note reviewed.   Constitutional:      General: He is not in acute distress.    Appearance: He is well-developed.  HENT:     Head: Normocephalic and atraumatic.  Eyes:     Conjunctiva/sclera: Conjunctivae normal.  Cardiovascular:     Rate and Rhythm: Normal rate and regular rhythm.     Heart sounds: No murmur heard. Pulmonary:     Effort: Pulmonary effort is normal. No respiratory distress.     Breath sounds: Normal breath sounds.  Abdominal:     Palpations: Abdomen is soft.     Tenderness: There is generalized abdominal tenderness. There is no guarding or rebound.  Musculoskeletal:        General: No swelling.     Cervical back: Neck supple.  Skin:    General: Skin is warm and dry.     Capillary Refill: Capillary refill takes less than 2 seconds.  Neurological:     Mental Status: He is alert.  Psychiatric:        Mood and Affect: Mood normal.    ED Results / Procedures / Treatments   Labs (all labs ordered are listed, but only abnormal results are displayed) Labs Reviewed  CBC WITH DIFFERENTIAL/PLATELET - Abnormal; Notable for the following components:      Result Value   WBC 15.1 (*)    Platelets 404 (*)    Neutro Abs 12.7 (*)    Abs Immature Granulocytes 0.08 (*)    All other components within normal limits  COMPREHENSIVE METABOLIC PANEL - Abnormal; Notable for the following components:   Potassium 3.1 (*)    Glucose, Bld 109 (*)    Total Protein 9.0 (*)    Albumin 5.5 (*)    AST 70 (*)    ALT 45 (*)    Total Bilirubin 1.3 (*)    All other components within normal limits  LIPASE, BLOOD    EKG None  Radiology No results found.  Procedures Procedures    Medications Ordered in ED Medications  potassium chloride SA (KLOR-CON M) CR tablet 40 mEq (has no administration in time range)  sodium chloride 0.9 % bolus 1,000 mL (0 mLs Intravenous Stopped 04/10/22 1750)  metoCLOPramide (REGLAN) injection 10 mg (10 mg Intravenous Given 04/10/22 1601)  diphenhydrAMINE (BENADRYL)  injection 12.5 mg (12.5 mg Intravenous Given 04/10/22 1601)  haloperidol lactate (HALDOL) injection 1 mg (1 mg Intravenous Given 04/10/22 1702)    ED Course/ Medical Decision Making/ A&P Clinical Course as of 04/10/22 1800  Mon Apr 10, 2022  1711 Minimal improvement of symptoms with Reglan and Benadryl.  Stable lipase.  Doubt pancreatitis.  Haldol given for likely cannabis induced hyperemesis syndrome. [AG]    Clinical Course User Index [AG] Apr 12, 2022, DO  Medical Decision Making Amount and/or Complexity of Data Reviewed Labs: ordered.  Risk Prescription drug management.   1036:1700 PM 26 year old male with past medical history of alcohol abuse and daily marijuana use presenting for complaints of nausea, vomiting, and generalized abdominal pain x2 hours.  Is alert and oriented x3, no acute distress, afebrile, stable vital signs.  Physical exam demonstrates soft, nonperitoneal abdomen with tenderness to palpation in all quadrants.  Guarding or rigidity.  Likely secondary to cannabis induced hyperemesis versus pancreatitis.  Laboratory studies pending.  IV fluids, Reglan, and Benadryl given for symptomatic management.  Normal improvement after Reglan.  Haldol given for suspected cannabis induced hyperemesis syndrome.  I independently interpreted patient's labs.  Stable electrolytes.  Stable liver profile, lipase, and renal function.  Doubt pancreatitis.  Patient had significant improvement of symptoms after Haldol.  Recommended to stop smoking marijuana at this time and follow-up with PCP for further management.  Zofran sent to pharmacy  Patient in no distress and overall condition improved here in the ED. Detailed discussions were had with the patient regarding current findings, and need for close f/u with PCP or on call doctor. The patient has been instructed to return immediately if the symptoms worsen in any way for re-evaluation. Patient verbalized understanding  and is in agreement with current care plan. All questions answered prior to discharge.         Final Clinical Impression(s) / ED Diagnoses Final diagnoses:  Cannabinoid hyperemesis syndrome    Rx / DC Orders ED Discharge Orders          Ordered    ondansetron (ZOFRAN) 4 MG tablet  Every 6 hours        04/10/22 1759              Franne FortsGray, Vincie Linn P, DO 04/10/22 1800

## 2022-04-10 NOTE — ED Notes (Signed)
Vomiting, copious emesis

## 2022-04-10 NOTE — ED Triage Notes (Signed)
Pt c/o abdominal pain with vomiting x 2 hours ago

## 2022-04-10 NOTE — ED Notes (Signed)
C/o abd pain and NVD. Report NVD r/t ETOH. Denies suspicious food, others with similar sx, recent illness or sick contacts, recent travel, or blood/bleeding. Onset this am 0900.

## 2022-04-10 NOTE — ED Notes (Signed)
Pt arrived back in ED approx 2010 after being discharged stating he is unable to get his nausea meds until tomorrow and has vomited again since leaving. Spoke with Dr Wallace Cullens who states will write order for prepack zofran to take home. Pt was educated on instruction and how to stay hydrated. Advised if takes med and still vomits to come back to ED. Pt agreed to all. Pt ambulatory and in nad.

## 2022-04-10 NOTE — ED Notes (Signed)
Up to b/r, steady gait, NV resolving/ less

## 2022-04-10 NOTE — Discharge Instructions (Signed)
Stop smoking marijuana.  It takes approximately 3 months for marijuana to clear out of your symptoms and to see improvement of symptoms.  Otherwise stable laboratory studies.  Stable electrolytes.  No signs of alcohol induced pancreatitis.   Zofran sent to your pharmacy for symptomatic management.

## 2022-05-23 ENCOUNTER — Emergency Department (HOSPITAL_COMMUNITY)
Admission: EM | Admit: 2022-05-23 | Discharge: 2022-05-23 | Disposition: A | Discharge status: Home / Self Care | Payer: 59 | Attending: Emergency Medicine | Admitting: Emergency Medicine

## 2022-05-23 ENCOUNTER — Other Ambulatory Visit: Payer: Self-pay

## 2022-05-23 ENCOUNTER — Emergency Department (HOSPITAL_COMMUNITY): Payer: 59

## 2022-05-23 ENCOUNTER — Encounter (HOSPITAL_COMMUNITY): Payer: Self-pay | Admitting: *Deleted

## 2022-05-23 DIAGNOSIS — R9431 Abnormal electrocardiogram [ECG] [EKG]: Secondary | ICD-10-CM | POA: Diagnosis not present

## 2022-05-23 DIAGNOSIS — R112 Nausea with vomiting, unspecified: Secondary | ICD-10-CM | POA: Insufficient documentation

## 2022-05-23 DIAGNOSIS — R1110 Vomiting, unspecified: Secondary | ICD-10-CM | POA: Diagnosis not present

## 2022-05-23 DIAGNOSIS — R945 Abnormal results of liver function studies: Secondary | ICD-10-CM | POA: Diagnosis not present

## 2022-05-23 DIAGNOSIS — R1033 Periumbilical pain: Secondary | ICD-10-CM | POA: Diagnosis not present

## 2022-05-23 DIAGNOSIS — R69 Illness, unspecified: Secondary | ICD-10-CM | POA: Diagnosis not present

## 2022-05-23 DIAGNOSIS — F1010 Alcohol abuse, uncomplicated: Secondary | ICD-10-CM | POA: Diagnosis not present

## 2022-05-23 DIAGNOSIS — K760 Fatty (change of) liver, not elsewhere classified: Secondary | ICD-10-CM | POA: Diagnosis not present

## 2022-05-23 DIAGNOSIS — R109 Unspecified abdominal pain: Secondary | ICD-10-CM | POA: Diagnosis not present

## 2022-05-23 LAB — CBC WITH DIFFERENTIAL/PLATELET
Abs Immature Granulocytes: 0.03 10*3/uL (ref 0.00–0.07)
Basophils Absolute: 0.1 10*3/uL (ref 0.0–0.1)
Basophils Relative: 1 %
Eosinophils Absolute: 0.1 10*3/uL (ref 0.0–0.5)
Eosinophils Relative: 1 %
HCT: 47.8 % (ref 39.0–52.0)
Hemoglobin: 16.1 g/dL (ref 13.0–17.0)
Immature Granulocytes: 0 %
Lymphocytes Relative: 11 %
Lymphs Abs: 0.9 10*3/uL (ref 0.7–4.0)
MCH: 32.7 pg (ref 26.0–34.0)
MCHC: 33.7 g/dL (ref 30.0–36.0)
MCV: 97.2 fL (ref 80.0–100.0)
Monocytes Absolute: 1.2 10*3/uL — ABNORMAL HIGH (ref 0.1–1.0)
Monocytes Relative: 14 %
Neutro Abs: 6.2 10*3/uL (ref 1.7–7.7)
Neutrophils Relative %: 73 %
Platelets: 271 10*3/uL (ref 150–400)
RBC: 4.92 MIL/uL (ref 4.22–5.81)
RDW: 13.2 % (ref 11.5–15.5)
WBC: 8.5 10*3/uL (ref 4.0–10.5)
nRBC: 0 % (ref 0.0–0.2)

## 2022-05-23 LAB — COMPREHENSIVE METABOLIC PANEL
ALT: 384 U/L — ABNORMAL HIGH (ref 0–44)
AST: 487 U/L — ABNORMAL HIGH (ref 15–41)
Albumin: 5 g/dL (ref 3.5–5.0)
Alkaline Phosphatase: 112 U/L (ref 38–126)
Anion gap: 18 — ABNORMAL HIGH (ref 5–15)
BUN: 8 mg/dL (ref 6–20)
CO2: 15 mmol/L — ABNORMAL LOW (ref 22–32)
Calcium: 9.9 mg/dL (ref 8.9–10.3)
Chloride: 100 mmol/L (ref 98–111)
Creatinine, Ser: 0.85 mg/dL (ref 0.61–1.24)
GFR, Estimated: >60 mL/min (ref >60)
Glucose, Bld: 89 mg/dL (ref 70–99)
Potassium: 3.7 mmol/L (ref 3.5–5.1)
Sodium: 133 mmol/L — ABNORMAL LOW (ref 135–145)
Total Bilirubin: 2.8 mg/dL — ABNORMAL HIGH (ref 0.3–1.2)
Total Protein: 8.5 g/dL — ABNORMAL HIGH (ref 6.5–8.1)

## 2022-05-23 LAB — HEPATITIS PANEL, ACUTE
HCV Ab: NONREACTIVE
Hep A IgM: NONREACTIVE
Hep B C IgM: NONREACTIVE
Hepatitis B Surface Ag: NONREACTIVE

## 2022-05-23 LAB — ACETAMINOPHEN LEVEL: Acetaminophen (Tylenol), Serum: <10 ug/mL — ABNORMAL LOW (ref 10–30)

## 2022-05-23 LAB — MAGNESIUM: Magnesium: 1.8 mg/dL (ref 1.7–2.4)

## 2022-05-23 LAB — LIPASE, BLOOD: Lipase: 22 U/L (ref 11–51)

## 2022-05-23 MED ORDER — ONDANSETRON 4 MG PO TBDP: 4.0000 mg | ORAL_TABLET | Freq: Three times a day (TID) | ORAL | 0 refills | Status: DC | PRN

## 2022-05-23 MED ORDER — ONDANSETRON HCL 4 MG/2ML IJ SOLN
4.0000 mg | Freq: Once | INTRAMUSCULAR | Status: AC
  Administered 2022-05-23: 4 mg via INTRAVENOUS
  Filled 2022-05-23: qty 2

## 2022-05-23 MED ORDER — THIAMINE HCL 100 MG/ML IJ SOLN
100.0000 mg | Freq: Once | INTRAMUSCULAR | Status: AC
  Administered 2022-05-23: 100 mg via INTRAVENOUS
  Filled 2022-05-23: qty 2

## 2022-05-23 MED ORDER — LACTATED RINGERS IV BOLUS
2000.0000 mL | Freq: Once | INTRAVENOUS | Status: AC
Start: 2022-05-23 — End: 2022-05-23
  Administered 2022-05-23: 2000 mL via INTRAVENOUS

## 2022-05-23 NOTE — ED Provider Notes (Signed)
[facility] EMERGENCY DEPARTMENT Provider Note   CSN: [id number] Arrival date & time: 05/23/22  4098     History  Chief Complaint  Patient presents with   Abdominal Pain    Timothy [name] is a 26 y.o. male.  HPI Patient presents for nausea and vomiting.  Medical history includes alcohol abuse and cannabinoid hyperemesis syndrome.  Patient reports current daily alcohol use of 6-12 beers per day.  His last alcoholic beverage was 6 PM last night.  Following that, he did develop some periumbilical pain, nausea, and vomiting.  He had continued vomiting throughout the night and into this morning.  He does continue to frequently use marijuana.  He did smoke marijuana this morning in an effort to resolve his symptoms of nausea and vomiting.  It did not work.  Due to his persistent symptoms, patient presents to the ED.  Other than periumbilical pain, patient denies any other areas of discomfort.  He denies any recent urinary symptoms or diarrhea.  He does endorse current nausea and states that he did vomit just prior to being bedded in the ED.    Home Medications Prior to Admission medications   Medication Sig Start Date End Date Taking? Authorizing Provider  albuterol (VENTOLIN HFA) 108 (90 Base) MCG/ACT inhaler Inhale 2 puffs into the lungs every 6 (six) hours as needed for wheezing. Patient not taking: Reported on 05/23/2022 08/28/21   Shon Hale, MD  chlordiazePOXIDE (LIBRIUM) 10 MG capsule Take 1 capsule (10 mg total) by mouth See admin instructions. Take Chlordiazepoxide/Librium 2 capsules Three times daily for 3 days, then 1 capsule three times a day for 2 days and STOP Patient not taking: Reported on 10/14/2021 08/28/21   Shon Hale, MD  doxycycline (VIBRAMYCIN) 100 MG capsule Take 1 capsule (100 mg total) by mouth 2 (two) times daily. One po bid x 7 days Patient not taking: Reported on 05/23/2022 01/13/22   Dartha Lodge, PA-C  folic acid (FOLVITE) 1 MG tablet Take 1 tablet (1 mg  total) by mouth daily. Patient not taking: Reported on 05/23/2022 08/29/21   Shon Hale, MD  LORazepam (ATIVAN) 1 MG tablet Take 1 tablet (1 mg total) by mouth every 6 (six) hours as needed (Nausea, shakiness). Patient not taking: Reported on 05/23/2022 10/14/21   Mancel Bale, MD  metoCLOPramide (REGLAN) 10 MG tablet Take 1 tablet (10 mg total) by mouth every 6 (six) hours. Patient not taking: Reported on 05/23/2022 12/06/21   Achille Rich, PA-C  Multiple Vitamin (MULTIVITAMIN WITH MINERALS) TABS tablet Take 1 tablet by mouth daily. Patient not taking: Reported on 05/23/2022 08/29/21   Shon Hale, MD  omeprazole (PRILOSEC OTC) 20 MG tablet Take 1 tablet (20 mg total) by mouth daily. Patient not taking: Reported on 05/23/2022 08/28/21 08/28/22  Shon Hale, MD  ondansetron (ZOFRAN) 4 MG tablet Take 1 tablet (4 mg total) by mouth every 6 (six) hours. Patient not taking: Reported on 05/23/2022 04/10/22   Edwin Dada P, DO  ondansetron (ZOFRAN) 4 MG tablet Take 1 tablet (4 mg total) by mouth every 6 (six) hours. Patient not taking: Reported on 05/23/2022 04/10/22   Edwin Dada P, DO  ondansetron (ZOFRAN-ODT) 4 MG disintegrating tablet Take 1 tablet (4 mg total) by mouth every 8 (eight) hours as needed for nausea or vomiting. 05/23/22   Gloris Manchester, MD  pantoprazole (PROTONIX) 40 MG tablet Take 1 tablet (40 mg total) by mouth daily. Patient not taking: Reported on 05/23/2022 12/06/21   Mancel Bale,  MD  thiamine 100 MG tablet Take 1 tablet (100 mg total) by mouth daily. Patient not taking: Reported on 05/23/2022 08/29/21   Shon Hale, MD      Allergies    Amoxil [amoxicillin]    Review of Systems   Review of Systems  Gastrointestinal:  Positive for abdominal pain, nausea and vomiting.  All other systems reviewed and are negative.   Physical Exam Updated Vital Signs BP (!) 127/108   Pulse 75   Temp 98.1 F (36.7 C) (Oral)   Resp 18   Ht 5\' 4"  (1.626 m)   Wt 72.6 kg   SpO2 99%    BMI 27.46 kg/m  Physical Exam Vitals and nursing note reviewed.  Constitutional:      General: He is not in acute distress.    Appearance: He is well-developed. He is not ill-appearing, toxic-appearing or diaphoretic.  HENT:     Head: Normocephalic and atraumatic.     Mouth/Throat:     Mouth: Mucous membranes are moist.     Pharynx: Oropharynx is clear.  Eyes:     General: No scleral icterus.    Conjunctiva/sclera: Conjunctivae normal.     Pupils: Pupils are equal, round, and reactive to light.  Cardiovascular:     Rate and Rhythm: Normal rate and regular rhythm.     Heart sounds: No murmur heard. Pulmonary:     Effort: Pulmonary effort is normal. No respiratory distress.  Abdominal:     Palpations: Abdomen is soft.     Tenderness: There is no abdominal tenderness. There is no right CVA tenderness or left CVA tenderness.  Musculoskeletal:        General: No swelling.     Cervical back: Neck supple.  Skin:    General: Skin is warm and dry.     Capillary Refill: Capillary refill takes less than 2 seconds.     Coloration: Skin is not jaundiced or pale.  Neurological:     General: No focal deficit present.     Mental Status: He is alert and oriented to person, place, and time.     Cranial Nerves: No cranial nerve deficit.     Motor: No weakness.  Psychiatric:        Mood and Affect: Mood normal.        Behavior: Behavior normal.     ED Results / Procedures / Treatments   Labs (all labs ordered are listed, but only abnormal results are displayed) Labs Reviewed  COMPREHENSIVE METABOLIC PANEL - Abnormal; Notable for the following components:      Result Value   Sodium 133 (*)    CO2 15 (*)    Total Protein 8.5 (*)    AST 487 (*)    ALT 384 (*)    Total Bilirubin 2.8 (*)    Anion gap 18 (*)    All other components within normal limits  CBC WITH DIFFERENTIAL/PLATELET - Abnormal; Notable for the following components:   Monocytes Absolute 1.2 (*)    All other components  within normal limits  ACETAMINOPHEN LEVEL - Abnormal; Notable for the following components:   Acetaminophen (Tylenol), Serum <10 (*)    All other components within normal limits  LIPASE, BLOOD  MAGNESIUM  URINALYSIS, ROUTINE W REFLEX MICROSCOPIC  HEPATITIS PANEL, ACUTE    EKG EKG Interpretation  Date/Time:  Tuesday May 23 2022 08:58:33 EDT Ventricular Rate:  85 PR Interval:  129 QRS Duration: 95 QT Interval:  363 QTC Calculation: 432 R  Axis:   78 Text Interpretation: Sinus rhythm Confirmed by Gloris Manchester [phone]) on 05/23/2022 9:12:45 AM  Radiology US Abdomen Limited  Result Date: 05/23/2022 CLINICAL DATA:  26 year old male with abnormal LFTs. Mid abdominal pain with vomiting. EXAM: ULTRASOUND ABDOMEN LIMITED RIGHT UPPER QUADRANT COMPARISON:  CT Abdomen and Pelvis 12/05/2021. FINDINGS: Gallbladder: No gallstones or wall thickening visualized. No sonographic Murphy sign noted by sonographer. Common bile duct: Diameter: 3 mm, normal. Liver: Only mildly increased liver echogenicity (image 19) as opposed to the more pronounced steatosis demonstrated by CT in January. No discrete liver lesion. No intrahepatic biliary ductal dilatation. Portal vein is patent on color Doppler imaging with normal direction of blood flow towards the liver. Other: Negative visible right kidney.  No free fluid. IMPRESSION: 1. Hepatic steatosis may have regressed since January. 2. Otherwise normal right upper quadrant ultrasound. Electronically Signed   By: Odessa Fleming M.D.   On: 05/23/2022 11:19    Procedures Procedures    Medications Ordered in ED Medications  ondansetron (ZOFRAN) injection 4 mg (4 mg Intravenous Given 05/23/22 0844)  lactated ringers bolus 2,000 mL (0 mLs Intravenous Stopped 05/23/22 1141)  thiamine (B-1) injection 100 mg (100 mg Intravenous Given 05/23/22 0846)    ED Course/ Medical Decision Making/ A&P                           Medical Decision Making Amount and/or Complexity of Data  Reviewed Labs: ordered. Radiology: ordered.  Risk Prescription drug management.   This patient presents to the ED for concern of nausea and vomiting, this involves an extensive number of treatment options, and is a complaint that carries with it a high risk of complications and morbidity.  The differential diagnosis includes gastritis, GERD, dehydration, alcohol withdrawal, cannabinoid hyperemesis syndrome, cholecystitis, hepatitis, pancreatitis, PUD   Co morbidities that complicate the patient evaluation  Alcohol use, cannabinoid use   Additional history obtained:  Additional history obtained from N/A External records from outside source obtained and reviewed including EMR   Lab Tests:  I Ordered, and personally interpreted labs.  The pertinent results include: New elevation in transaminases, consistent with alcohol abuse, anion gap metabolic acidosis consistent with alcoholic/starvation ketosis, further lab results were unremarkable   Imaging Studies ordered:  I ordered imaging studies including ultrasound of right upper quadrant I independently visualized and interpreted imaging which showed chronic hepatic steatosis consistent with alcohol use with no acute findings I agree with the radiologist interpretation   Cardiac Monitoring: / EKG:  The patient was maintained on a cardiac monitor.  I personally viewed and interpreted the cardiac monitored which showed an underlying rhythm of: Sinus rhythm   Problem List / ED Course / Critical interventions / Medication management  Patient is a 26 year old male with history of polysubstance abuse, presenting for nausea and vomiting since last night.  He does endorse a daily alcohol intake of 6-12 beers.  His last drink was 6 PM last night.  This was prior to the onset of his symptoms.  He describes the onset of a periumbilical pain followed by nausea and vomiting that has persisted throughout the night.  He has a history of similar  symptoms that required ED visits in the past.  He does frequently smoke marijuana.  Last use of marijuana was this morning.  Patient is well-appearing on exam.  His vital signs are normal on arrival.  His abdomen is soft and without tenderness.  Given his p.o.  intolerance and vomiting since last night, patient was given 2 L of IV fluid for hydration.  He was given Zofran for nausea.  On reassessment, patient does report resolution of nausea.  He was able to tolerate p.o. intake.  His lab results are notable for a new increase in transaminases.  I suspect this is secondary to his alcohol abuse.  He also has an anion gap metabolic acidosis consistent with alcoholic/starvation ketosis.  Patient was able to eat in the ED.  He was advised to continue to stay hydrated at home.  He has been prescribed Reglan and Zofran in the past but states that he is down to only a few tablets of Zofran.  He was given a new prescription.  Patient will benefit from establishing primary care doctor for repeat lab work to ensure no worsening of his transaminitis.  Hepatitis panel and right upper quadrant ultrasound did not show any other findings suggestive of alternative etiologies other than his alcohol abuse.  Contact information for establishment of PCP was provided.  Patient was encouraged to return to the ED for any worsening of symptoms.  He was discharged in good condition. I ordered medication including IV fluids for dehydration; Zofran for nausea Reevaluation of the patient after these medicines showed that the patient resolved I have reviewed the patients home medicines and have made adjustments as needed   Social Determinants of Health:  Does not have a PCP         Final Clinical Impression(s) / ED Diagnoses Final diagnoses:  Nausea and vomiting, unspecified vomiting type    Rx / DC Orders ED Discharge Orders          Ordered    ondansetron (ZOFRAN-ODT) 4 MG disintegrating tablet  Every 8 hours PRN,    Status:  Discontinued        05/23/22 1312    ondansetron (ZOFRAN-ODT) 4 MG disintegrating tablet  Every 8 hours PRN        05/23/22 1338              Gloris Manchester, MD 05/23/22 1817

## 2022-05-23 NOTE — ED Triage Notes (Signed)
Pt c/o abdominal pain with vomiting that started last night

## 2022-05-23 NOTE — Discharge Instructions (Addendum)
A prescription for more Zofran was sent to your pharmacy.  Take this as needed for nausea and vomiting.  Ensure that you stay well-hydrated at home to avoid worsening symptoms.  Your liver enzymes were elevated today.  This is likely due to your alcohol use.  You should establish a primary care doctor for repeat lab work in the near future.  There is a telephone number below that you can call to establish a primary doctor.  Return to the ED at any time for any new or worsening symptoms of concern.

## 2022-05-24 ENCOUNTER — Emergency Department (HOSPITAL_COMMUNITY)
Admission: EM | Admit: 2022-05-24 | Discharge: 2022-05-25 | Disposition: A | Discharge status: Home / Self Care | Payer: 59 | Attending: Emergency Medicine | Admitting: Emergency Medicine

## 2022-05-24 ENCOUNTER — Encounter (HOSPITAL_COMMUNITY): Payer: Self-pay

## 2022-05-24 ENCOUNTER — Other Ambulatory Visit: Payer: Self-pay

## 2022-05-24 DIAGNOSIS — K7010 Alcoholic hepatitis without ascites: Secondary | ICD-10-CM | POA: Insufficient documentation

## 2022-05-24 DIAGNOSIS — R7989 Other specified abnormal findings of blood chemistry: Secondary | ICD-10-CM | POA: Insufficient documentation

## 2022-05-24 DIAGNOSIS — R112 Nausea with vomiting, unspecified: Secondary | ICD-10-CM | POA: Diagnosis not present

## 2022-05-24 DIAGNOSIS — R69 Illness, unspecified: Secondary | ICD-10-CM | POA: Diagnosis not present

## 2022-05-24 HISTORY — DX: Nausea with vomiting, unspecified: R11.2

## 2022-05-24 HISTORY — DX: Cannabis use, unspecified, uncomplicated: F12.90

## 2022-05-24 LAB — COMPREHENSIVE METABOLIC PANEL
ALT: 555 U/L — ABNORMAL HIGH (ref 0–44)
AST: 505 U/L — ABNORMAL HIGH (ref 15–41)
Albumin: 5.2 g/dL — ABNORMAL HIGH (ref 3.5–5.0)
Alkaline Phosphatase: 108 U/L (ref 38–126)
Anion gap: 15 (ref 5–15)
BUN: 5 mg/dL — ABNORMAL LOW (ref 6–20)
CO2: 23 mmol/L (ref 22–32)
Calcium: 9.8 mg/dL (ref 8.9–10.3)
Chloride: 100 mmol/L (ref 98–111)
Creatinine, Ser: 0.81 mg/dL (ref 0.61–1.24)
GFR, Estimated: >60 mL/min (ref >60)
Glucose, Bld: 118 mg/dL — ABNORMAL HIGH (ref 70–99)
Potassium: 3.4 mmol/L — ABNORMAL LOW (ref 3.5–5.1)
Sodium: 138 mmol/L (ref 135–145)
Total Bilirubin: 1.1 mg/dL (ref 0.3–1.2)
Total Protein: 8.7 g/dL — ABNORMAL HIGH (ref 6.5–8.1)

## 2022-05-24 LAB — CBC WITH DIFFERENTIAL/PLATELET
Abs Immature Granulocytes: 0.03 10*3/uL (ref 0.00–0.07)
Basophils Absolute: 0.1 10*3/uL (ref 0.0–0.1)
Basophils Relative: 1 %
Eosinophils Absolute: 0.1 10*3/uL (ref 0.0–0.5)
Eosinophils Relative: 2 %
HCT: 47.3 % (ref 39.0–52.0)
Hemoglobin: 16.3 g/dL (ref 13.0–17.0)
Immature Granulocytes: 1 %
Lymphocytes Relative: 19 %
Lymphs Abs: 1.2 10*3/uL (ref 0.7–4.0)
MCH: 33.1 pg (ref 26.0–34.0)
MCHC: 34.5 g/dL (ref 30.0–36.0)
MCV: 96.1 fL (ref 80.0–100.0)
Monocytes Absolute: 0.6 10*3/uL (ref 0.1–1.0)
Monocytes Relative: 10 %
Neutro Abs: 4.4 10*3/uL (ref 1.7–7.7)
Neutrophils Relative %: 67 %
Platelets: 257 10*3/uL (ref 150–400)
RBC: 4.92 MIL/uL (ref 4.22–5.81)
RDW: 13 % (ref 11.5–15.5)
WBC: 6.4 10*3/uL (ref 4.0–10.5)
nRBC: 0 % (ref 0.0–0.2)

## 2022-05-24 LAB — LIPASE, BLOOD: Lipase: 27 U/L (ref 11–51)

## 2022-05-24 LAB — ETHANOL: Alcohol, Ethyl (B): 112 mg/dL — ABNORMAL HIGH (ref <10)

## 2022-05-24 MED ORDER — LORAZEPAM 2 MG/ML IJ SOLN
1.0000 mg | Freq: Once | INTRAMUSCULAR | Status: AC
  Administered 2022-05-24: 1 mg via INTRAVENOUS
  Filled 2022-05-24: qty 1

## 2022-05-24 MED ORDER — ONDANSETRON HCL 4 MG/2ML IJ SOLN
4.0000 mg | Freq: Once | INTRAMUSCULAR | Status: AC
Start: 2022-05-24 — End: 2022-05-24
  Administered 2022-05-24: 4 mg via INTRAVENOUS
  Filled 2022-05-24: qty 2

## 2022-05-24 MED ORDER — THIAMINE HCL 100 MG/ML IJ SOLN: 100.0000 mg | Freq: Every day | INTRAMUSCULAR | Status: DC

## 2022-05-24 NOTE — ED Triage Notes (Signed)
Pov from home. Cc of emesis. Recently here for the same. Uses marijuana.

## 2022-05-24 NOTE — ED Provider Notes (Signed)
Timothy [name]   Timothy [name] Arrival date & time: 05/24/22  2112     History {Add pertinent medical, surgical, social history, OB history to HPI:1} Chief complaint nausea vomiting  Timothy [name] is a 26 y.o. male.  HPI  Patient has a history of cannabinoid hyperemesis syndrome, alcohol abuse who presents to the ED with complaints of recurrent vomiting.  Patient was recently seen in the emergency roomYesterday for issues with nausea and vomiting.  This was in the setting of excessive alcohol consumption.  Patient states he improved after treatment in the ED however when he went home he started drinking alcohol again.  He said after 1 beer he started having vomiting again.  He is not having any pain.  Denies any fevers.  He has not been able to keep anything down.  Patient states he does get shaky when he does not drink and this partly concerned him and that is why he drank again  Home Medications Prior to Admission medications   Medication Sig Start Date End Date Taking? Authorizing Provider  albuterol (VENTOLIN HFA) 108 (90 Base) MCG/ACT inhaler Inhale 2 puffs into the lungs every 6 (six) hours as needed for wheezing. Patient not taking: Reported on 05/23/2022 08/28/21   Shon Hale, MD  chlordiazePOXIDE (LIBRIUM) 10 MG capsule Take 1 capsule (10 mg total) by mouth See admin instructions. Take Chlordiazepoxide/Librium 2 capsules Three times daily for 3 days, then 1 capsule three times a day for 2 days and STOP Patient not taking: Reported on 10/14/2021 08/28/21   Shon Hale, MD  doxycycline (VIBRAMYCIN) 100 MG capsule Take 1 capsule (100 mg total) by mouth 2 (two) times daily. One po bid x 7 days Patient not taking: Reported on 05/23/2022 01/13/22   Dartha Lodge, PA-C  folic acid (FOLVITE) 1 MG tablet Take 1 tablet (1 mg total) by mouth daily. Patient not taking: Reported on 05/23/2022 08/29/21   Shon Hale, MD  LORazepam (ATIVAN) 1 MG tablet  Take 1 tablet (1 mg total) by mouth every 6 (six) hours as needed (Nausea, shakiness). Patient not taking: Reported on 05/23/2022 10/14/21   Mancel Bale, MD  metoCLOPramide (REGLAN) 10 MG tablet Take 1 tablet (10 mg total) by mouth every 6 (six) hours. Patient not taking: Reported on 05/23/2022 12/06/21   Achille Rich, PA-C  Multiple Vitamin (MULTIVITAMIN WITH MINERALS) TABS tablet Take 1 tablet by mouth daily. Patient not taking: Reported on 05/23/2022 08/29/21   Shon Hale, MD  omeprazole (PRILOSEC OTC) 20 MG tablet Take 1 tablet (20 mg total) by mouth daily. Patient not taking: Reported on 05/23/2022 08/28/21 08/28/22  Shon Hale, MD  ondansetron (ZOFRAN) 4 MG tablet Take 1 tablet (4 mg total) by mouth every 6 (six) hours. Patient not taking: Reported on 05/23/2022 04/10/22   Edwin Dada P, DO  ondansetron (ZOFRAN) 4 MG tablet Take 1 tablet (4 mg total) by mouth every 6 (six) hours. Patient not taking: Reported on 05/23/2022 04/10/22   Edwin Dada P, DO  ondansetron (ZOFRAN-ODT) 4 MG disintegrating tablet Take 1 tablet (4 mg total) by mouth every 8 (eight) hours as needed for nausea or vomiting. 05/23/22   Gloris Manchester, MD  pantoprazole (PROTONIX) 40 MG tablet Take 1 tablet (40 mg total) by mouth daily. Patient not taking: Reported on 05/23/2022 12/06/21   Mancel Bale, MD  thiamine 100 MG tablet Take 1 tablet (100 mg total) by mouth daily. Patient not taking: Reported on 05/23/2022 08/29/21  Shon Hale, MD      Allergies    Amoxil [amoxicillin]    Review of Systems   Review of Systems  Physical Exam Updated Vital Signs BP 111/74   Pulse (!) 104   Temp 98 F (36.7 C) (Oral)   Resp 18   Ht 1.626 m (5\' 4" )   Wt 72.6 kg   SpO2 100%   BMI 27.46 kg/m  Physical Exam Vitals and nursing [name] reviewed.  Constitutional:      Appearance: He is well-developed.  HENT:     Head: Normocephalic and atraumatic.     Right Ear: External ear normal.     Left Ear: External ear normal.   Eyes:     General: No scleral icterus.       Right eye: No discharge.        Left eye: No discharge.     Conjunctiva/sclera: Conjunctivae normal.  Neck:     Trachea: No tracheal deviation.  Cardiovascular:     Rate and Rhythm: Normal rate and regular rhythm.  Pulmonary:     Effort: Pulmonary effort is normal. No respiratory distress.     Breath sounds: Normal breath sounds. No stridor. No wheezing or rales.  Abdominal:     General: Bowel sounds are normal. There is no distension.     Palpations: Abdomen is soft.     Tenderness: There is no abdominal tenderness. There is no guarding or rebound.     Comments: Vomiting clear fluid  Musculoskeletal:        General: No tenderness or deformity.     Cervical back: Neck supple.  Skin:    General: Skin is warm and dry.     Findings: No rash.  Neurological:     General: No focal deficit present.     Mental Status: He is alert.     Cranial Nerves: No cranial nerve deficit (no facial droop, extraocular movements intact, no slurred speech).     Sensory: No sensory deficit.     Motor: No abnormal muscle tone or seizure activity.     Coordination: Coordination normal.  Psychiatric:        Mood and Affect: Mood normal.    ED Results / Procedures / Treatments   Labs (all labs ordered are listed, but only abnormal results are displayed) Labs Reviewed  COMPREHENSIVE METABOLIC PANEL  LIPASE, BLOOD  CBC WITH DIFFERENTIAL/PLATELET  URINALYSIS, ROUTINE W REFLEX MICROSCOPIC  ETHANOL    EKG None  Radiology Abdomen Limited  Result Date: 05/23/2022 CLINICAL DATA:  26 year old male with abnormal LFTs. Mid abdominal pain with vomiting. EXAM: ULTRASOUND ABDOMEN LIMITED RIGHT UPPER QUADRANT COMPARISON:  CT Abdomen and Pelvis 12/05/2021. FINDINGS: Gallbladder: No gallstones or wall thickening visualized. No sonographic Murphy sign noted by sonographer. Common bile duct: Diameter: 3 mm, normal. Liver: Only mildly increased liver echogenicity  (image 19) as opposed to the more pronounced steatosis demonstrated by CT in January. No discrete liver lesion. No intrahepatic biliary ductal dilatation. Portal vein is patent on color Doppler imaging with normal direction of blood flow towards the liver. Other: Negative visible right kidney.  No free fluid. IMPRESSION: 1. Hepatic steatosis may have regressed since January. 2. Otherwise normal right upper quadrant ultrasound. Electronically Signed   By: February M.D.   On: 05/23/2022 11:19    Procedures Procedures  {Document cardiac monitor, telemetry assessment procedure when appropriate:1}  Medications Ordered in ED Medications  LORazepam (ATIVAN) injection 1 mg (has no administration in  time range)  ondansetron (ZOFRAN) injection 4 mg (has no administration in time range)  thiamine (B-1) injection 100 mg (has no administration in time range)    ED Course/ Medical Decision Making/ A&P                           Medical Decision Making Amount and/or Complexity of Data Reviewed Labs: ordered.  Risk Prescription drug management.   ***  {Document critical care time when appropriate:1} {Document review of labs and clinical decision tools ie heart score, Chads2Vasc2 etc:1}  {Document your independent review of radiology images, and any outside records:1} {Document your discussion with family members, caretakers, and with consultants:1} {Document social determinants of health affecting pt's care:1} {Document your decision making why or why not admission, treatments were needed:1} Final Clinical Impression(s) / ED Diagnoses Final diagnoses:  None    Rx / DC Orders ED Discharge Orders     None

## 2022-05-24 NOTE — ED Notes (Signed)
Pt ambulated to bathroom with steady gait. 

## 2022-05-25 ENCOUNTER — Encounter (HOSPITAL_COMMUNITY): Payer: Self-pay

## 2022-05-25 ENCOUNTER — Emergency Department (HOSPITAL_COMMUNITY)
Admission: EM | Admit: 2022-05-25 | Discharge: 2022-05-25 | Disposition: A | Discharge status: Home / Self Care | Payer: 59 | Source: Home / Self Care | Attending: Emergency Medicine | Admitting: Emergency Medicine

## 2022-05-25 DIAGNOSIS — R112 Nausea with vomiting, unspecified: Secondary | ICD-10-CM | POA: Insufficient documentation

## 2022-05-25 DIAGNOSIS — Z8719 Personal history of other diseases of the digestive system: Secondary | ICD-10-CM

## 2022-05-25 MED ORDER — SODIUM CHLORIDE 0.9 % IV BOLUS
1000.0000 mL | Freq: Once | INTRAVENOUS | Status: AC
  Administered 2022-05-25: 1000 mL via INTRAVENOUS

## 2022-05-25 MED ORDER — METOCLOPRAMIDE HCL 5 MG/ML IJ SOLN
10.0000 mg | Freq: Once | INTRAMUSCULAR | Status: AC
  Administered 2022-05-25: 10 mg via INTRAVENOUS
  Filled 2022-05-25: qty 2

## 2022-05-25 MED ORDER — ONDANSETRON HCL 4 MG/2ML IJ SOLN
4.0000 mg | Freq: Once | INTRAMUSCULAR | Status: AC
Start: 2022-05-25 — End: 2022-05-25
  Administered 2022-05-25: 4 mg via INTRAVENOUS
  Filled 2022-05-25: qty 2

## 2022-05-25 MED ORDER — ONDANSETRON 8 MG PO TBDP: ORAL_TABLET | ORAL | 0 refills | Status: DC

## 2022-05-25 MED ORDER — CHLORDIAZEPOXIDE HCL 25 MG PO CAPS: ORAL_CAPSULE | ORAL | 0 refills | Status: DC

## 2022-05-25 MED ORDER — SODIUM CHLORIDE 0.9 % IV BOLUS
2000.0000 mL | Freq: Once | INTRAVENOUS | Status: AC
  Administered 2022-05-25: 2000 mL via INTRAVENOUS

## 2022-05-25 NOTE — Discharge Instructions (Addendum)
Begin taking Zofran as prescribed.  Clear liquids for the next 12 hours, then advance diet as tolerated.

## 2022-05-25 NOTE — ED Provider Notes (Signed)
[facility] EMERGENCY DEPARTMENT Provider Note   CSN: [id number] Arrival date & time: 05/25/22  0354     History  Chief Complaint  Patient presents with   Emesis    Timothy [name] is a 26 y.o. male.  Patient is a 26 year old male with history of alcohol abuse and alcohol induced hepatitis.  Patient discharged 3 hours ago after being seen here for vomiting and nausea.  He was given medications and fluids and was able to tolerate oral hydration.  He returned several hours later stating that he is vomiting again.  No fevers or chills.  No abdominal pain or black or bloody stool.  Patient with history of multiple visits for the same.  The history is provided by the patient.       Home Medications Prior to Admission medications   Medication Sig Start Date End Date Taking? Authorizing Provider  albuterol (VENTOLIN HFA) 108 (90 Base) MCG/ACT inhaler Inhale 2 puffs into the lungs every 6 (six) hours as needed for wheezing. Patient not taking: Reported on 05/23/2022 08/28/21   Shon Hale, MD  chlordiazePOXIDE (LIBRIUM) 25 MG capsule 50mg  PO TID x 1D, then 25-50mg  PO BID X 1D, then 25-50mg  PO QD X 1D 05/25/22   07/26/22, MD  doxycycline (VIBRAMYCIN) 100 MG capsule Take 1 capsule (100 mg total) by mouth 2 (two) times daily. One po bid x 7 days Patient not taking: Reported on 05/23/2022 01/13/22   01/15/22, PA-C  folic acid (FOLVITE) 1 MG tablet Take 1 tablet (1 mg total) by mouth daily. Patient not taking: Reported on 05/23/2022 08/29/21   10/29/21, MD  LORazepam (ATIVAN) 1 MG tablet Take 1 tablet (1 mg total) by mouth every 6 (six) hours as needed (Nausea, shakiness). Patient not taking: Reported on 05/23/2022 10/14/21   10/16/21, MD  metoCLOPramide (REGLAN) 10 MG tablet Take 1 tablet (10 mg total) by mouth every 6 (six) hours. Patient not taking: Reported on 05/23/2022 12/06/21   12/08/21, PA-C  Multiple Vitamin (MULTIVITAMIN WITH MINERALS) TABS tablet Take 1 tablet by  mouth daily. Patient not taking: Reported on 05/23/2022 08/29/21   10/29/21, MD  omeprazole (PRILOSEC OTC) 20 MG tablet Take 1 tablet (20 mg total) by mouth daily. Patient not taking: Reported on 05/23/2022 08/28/21 08/28/22  10/28/22, MD  ondansetron (ZOFRAN) 4 MG tablet Take 1 tablet (4 mg total) by mouth every 6 (six) hours. Patient not taking: Reported on 05/23/2022 04/10/22   04/12/22 P, DO  ondansetron (ZOFRAN) 4 MG tablet Take 1 tablet (4 mg total) by mouth every 6 (six) hours. Patient not taking: Reported on 05/23/2022 04/10/22   04/12/22 P, DO  ondansetron (ZOFRAN-ODT) 4 MG disintegrating tablet Take 1 tablet (4 mg total) by mouth every 8 (eight) hours as needed for nausea or vomiting. 05/23/22   07/24/22, MD  pantoprazole (PROTONIX) 40 MG tablet Take 1 tablet (40 mg total) by mouth daily. Patient not taking: Reported on 05/23/2022 12/06/21   12/08/21, MD  thiamine 100 MG tablet Take 1 tablet (100 mg total) by mouth daily. Patient not taking: Reported on 05/23/2022 08/29/21   10/29/21, MD      Allergies    Amoxil [amoxicillin]    Review of Systems   Review of Systems  All other systems reviewed and are negative.   Physical Exam Updated Vital Signs BP (!) 137/110   Pulse 87   Temp 97.8 F (36.6 C) (Oral)  Resp 19   Ht 5\' 4"  (1.626 m)   Wt 72 kg   SpO2 97%   BMI 27.25 kg/m  Physical Exam Vitals and nursing note reviewed.  Constitutional:      General: He is not in acute distress.    Appearance: He is well-developed. He is not diaphoretic.  HENT:     Head: Normocephalic and atraumatic.  Cardiovascular:     Rate and Rhythm: Normal rate and regular rhythm.     Heart sounds: No murmur heard.    No friction rub.  Pulmonary:     Effort: Pulmonary effort is normal. No respiratory distress.     Breath sounds: Normal breath sounds. No wheezing or rales.  Abdominal:     General: Bowel sounds are normal. There is no distension.     Palpations:  Abdomen is soft.     Tenderness: There is no abdominal tenderness.  Musculoskeletal:        General: Normal range of motion.     Cervical back: Normal range of motion and neck supple.  Skin:    General: Skin is warm and dry.  Neurological:     Mental Status: He is alert and oriented to person, place, and time.     Coordination: Coordination normal.     ED Results / Procedures / Treatments   Labs (all labs ordered are listed, but only abnormal results are displayed) Labs Reviewed - No data to display  EKG None  Radiology Abdomen Limited  Result Date: 05/23/2022 CLINICAL DATA:  26 year old male with abnormal LFTs. Mid abdominal pain with vomiting. EXAM: ULTRASOUND ABDOMEN LIMITED RIGHT UPPER QUADRANT COMPARISON:  CT Abdomen and Pelvis 12/05/2021. FINDINGS: Gallbladder: No gallstones or wall thickening visualized. No sonographic Murphy sign noted by sonographer. Common bile duct: Diameter: 3 mm, normal. Liver: Only mildly increased liver echogenicity (image 19) as opposed to the more pronounced steatosis demonstrated by CT in January. No discrete liver lesion. No intrahepatic biliary ductal dilatation. Portal vein is patent on color Doppler imaging with normal direction of blood flow towards the liver. Other: Negative visible right kidney.  No free fluid. IMPRESSION: 1. Hepatic steatosis may have regressed since January. 2. Otherwise normal right upper quadrant ultrasound. Electronically Signed   By: February M.D.   On: 05/23/2022 11:19    Procedures Procedures    Medications Ordered in ED Medications  sodium chloride 0.9 % bolus 1,000 mL (has no administration in time range)  ondansetron (ZOFRAN) injection 4 mg (has no administration in time range)  metoCLOPramide (REGLAN) injection 10 mg (has no administration in time range)    ED Course/ Medical Decision Making/ A&P  Patient presents with ongoing nausea and vomiting hours after being discharged for similar complaints.  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  I suspect his presentation these past 2 days has been a combination of both.  Either way, he is feeling better after IV fluids and Zofran.  He is now tolerating liquids and I feel can be discharged again.  Final Clinical Impression(s) / ED Diagnoses Final diagnoses:  None    Rx / DC Orders ED Discharge Orders     None         07/24/2022, MD 05/25/22 [phone]

## 2022-05-25 NOTE — ED Provider Notes (Signed)
  Physical Exam  BP 111/74   Pulse (!) 104   Temp 98 F (36.7 C) (Oral)   Resp 18   Ht 5\' 4"  (1.626 m)   Wt 72.6 kg   SpO2 100%   BMI 27.46 kg/m   Physical Exam Vitals and nursing note reviewed.  Constitutional:      General: He is not in acute distress.    Appearance: He is well-developed. He is not diaphoretic.  HENT:     Head: Normocephalic and atraumatic.  Cardiovascular:     Rate and Rhythm: Normal rate and regular rhythm.     Heart sounds: No murmur heard.    No friction rub.  Pulmonary:     Effort: Pulmonary effort is normal. No respiratory distress.     Breath sounds: Normal breath sounds. No wheezing or rales.  Abdominal:     General: Bowel sounds are normal. There is no distension.     Palpations: Abdomen is soft.     Tenderness: There is no abdominal tenderness.  Musculoskeletal:        General: Normal range of motion.     Cervical back: Normal range of motion and neck supple.  Skin:    General: Skin is warm and dry.  Neurological:     Mental Status: He is alert and oriented to person, place, and time.     Coordination: Coordination normal.     Procedures  Procedures  ED Course / MDM   Clinical Course as of 05/25/22 0149  Thu May 25, 2022  0005 CBC with Diff nl [JK]  0005 Ethanol(!) elevated [JK]  0005 Comprehensive metabolic panel(!) Lfts elevated [JK]  0005 Lipase, blood nl [JK]  0006 Patient had an ultrasound on July 4 that showed hepatic steatosis but no other acute abnormality [JK]    Clinical Course User Index [JK] 04-04-1976, MD   Care assumed from Dr. Linwood Dibbles at shift change.  Patient presenting here with nausea and vomiting in the setting of alcohol abuse.  He does have elevations of his LFTs, but laboratory studies otherwise unremarkable.  Care signed out to me awaiting IV fluids and reassessment.  He is now tolerating p.o. without difficulty and discharge seems appropriate.  Patient tells me that he does get somewhat shaky when he  quits drinking.  I will prescribe Librium and give outpatient resources guide for alcohol treatment.       Lynelle Doctor, MD 05/25/22 [phone]

## 2022-05-25 NOTE — Discharge Instructions (Signed)
Begin taking Librium as prescribed.  Follow-up with outpatient alcohol treatment.  The resource guide for local treatment centers has been provided in this discharge summary for you to call and make these arrangements.

## 2022-05-25 NOTE — ED Triage Notes (Signed)
Pt c/o emesis, was just seen today for the same. States it hasn't gotten better.

## 2022-08-01 ENCOUNTER — Emergency Department (HOSPITAL_COMMUNITY)
Admission: EM | Admit: 2022-08-01 | Discharge: 2022-08-01 | Disposition: A | Discharge status: Home / Self Care | Payer: 59 | Attending: Emergency Medicine | Admitting: Emergency Medicine

## 2022-08-01 ENCOUNTER — Encounter (HOSPITAL_COMMUNITY): Payer: Self-pay

## 2022-08-01 DIAGNOSIS — Z8719 Personal history of other diseases of the digestive system: Secondary | ICD-10-CM | POA: Insufficient documentation

## 2022-08-01 DIAGNOSIS — R112 Nausea with vomiting, unspecified: Secondary | ICD-10-CM | POA: Insufficient documentation

## 2022-08-01 LAB — CBC WITH DIFFERENTIAL/PLATELET
Abs Immature Granulocytes: 0.04 10*3/uL (ref 0.00–0.07)
Basophils Absolute: 0.1 10*3/uL (ref 0.0–0.1)
Basophils Relative: 1 %
Eosinophils Absolute: 0 10*3/uL (ref 0.0–0.5)
Eosinophils Relative: 0 %
HCT: 49.2 % (ref 39.0–52.0)
Hemoglobin: 16.8 g/dL (ref 13.0–17.0)
Immature Granulocytes: 0 %
Lymphocytes Relative: 9 %
Lymphs Abs: 1.1 10*3/uL (ref 0.7–4.0)
MCH: 32.1 pg (ref 26.0–34.0)
MCHC: 34.1 g/dL (ref 30.0–36.0)
MCV: 94.1 fL (ref 80.0–100.0)
Monocytes Absolute: 0.9 10*3/uL (ref 0.1–1.0)
Monocytes Relative: 7 %
Neutro Abs: 10.6 10*3/uL — ABNORMAL HIGH (ref 1.7–7.7)
Neutrophils Relative %: 83 %
Platelets: 289 10*3/uL (ref 150–400)
RBC: 5.23 MIL/uL (ref 4.22–5.81)
RDW: 12.7 % (ref 11.5–15.5)
WBC: 12.7 10*3/uL — ABNORMAL HIGH (ref 4.0–10.5)
nRBC: 0 % (ref 0.0–0.2)

## 2022-08-01 LAB — COMPREHENSIVE METABOLIC PANEL
ALT: 71 U/L — ABNORMAL HIGH (ref 0–44)
AST: 59 U/L — ABNORMAL HIGH (ref 15–41)
Albumin: 5.4 g/dL — ABNORMAL HIGH (ref 3.5–5.0)
Alkaline Phosphatase: 96 U/L (ref 38–126)
Anion gap: 19 — ABNORMAL HIGH (ref 5–15)
BUN: 8 mg/dL (ref 6–20)
CO2: 20 mmol/L — ABNORMAL LOW (ref 22–32)
Calcium: 10.3 mg/dL (ref 8.9–10.3)
Chloride: 104 mmol/L (ref 98–111)
Creatinine, Ser: 0.81 mg/dL (ref 0.61–1.24)
GFR, Estimated: >60 mL/min (ref >60)
Glucose, Bld: 95 mg/dL (ref 70–99)
Potassium: 3.5 mmol/L (ref 3.5–5.1)
Sodium: 143 mmol/L (ref 135–145)
Total Bilirubin: 1.3 mg/dL — ABNORMAL HIGH (ref 0.3–1.2)
Total Protein: 8.9 g/dL — ABNORMAL HIGH (ref 6.5–8.1)

## 2022-08-01 LAB — LIPASE, BLOOD: Lipase: 23 U/L (ref 11–51)

## 2022-08-01 LAB — ETHANOL: Alcohol, Ethyl (B): 68 mg/dL — ABNORMAL HIGH (ref <10)

## 2022-08-01 LAB — MAGNESIUM: Magnesium: 1.8 mg/dL (ref 1.7–2.4)

## 2022-08-01 MED ORDER — ONDANSETRON HCL 4 MG/2ML IJ SOLN
4.0000 mg | Freq: Once | INTRAMUSCULAR | Status: AC
  Administered 2022-08-01: 4 mg via INTRAVENOUS
  Filled 2022-08-01: qty 2

## 2022-08-01 MED ORDER — THIAMINE HCL 100 MG/ML IJ SOLN
100.0000 mg | Freq: Once | INTRAMUSCULAR | Status: AC
  Administered 2022-08-01: 100 mg via INTRAVENOUS
  Filled 2022-08-01: qty 2

## 2022-08-01 MED ORDER — ONDANSETRON HCL 4 MG PO TABS: 4.0000 mg | ORAL_TABLET | Freq: Four times a day (QID) | ORAL | 0 refills | Status: DC

## 2022-08-01 MED ORDER — SODIUM CHLORIDE 0.9 % IV BOLUS
2000.0000 mL | Freq: Once | INTRAVENOUS | Status: AC
  Administered 2022-08-01: 2000 mL via INTRAVENOUS

## 2022-08-01 MED ORDER — OMEPRAZOLE 20 MG PO CPDR: 20.0000 mg | DELAYED_RELEASE_CAPSULE | Freq: Every day | ORAL | 0 refills | Status: DC

## 2022-08-01 MED ORDER — LORAZEPAM 2 MG/ML IJ SOLN
1.0000 mg | Freq: Once | INTRAMUSCULAR | Status: AC
  Administered 2022-08-01: 1 mg via INTRAVENOUS
  Filled 2022-08-01: qty 1

## 2022-08-01 NOTE — ED Triage Notes (Addendum)
Pt c/o vomiting that started at 0530. Smoked weed last night.

## 2022-08-01 NOTE — ED Notes (Signed)
Pt has personal bottle of water and induces vomiting every few minutes by sticking his fingers in his mouth. Encouraged to stop drinking water until his nausea subsides.

## 2022-08-01 NOTE — Discharge Instructions (Addendum)
Frequent small sips of fluids today.  Bland diet as tolerated.  Please follow-up with your primary care provider for recheck return to the emergency department for any new or worsening symptoms.  I have also given follow-up information for a local GI provider for you to see.

## 2022-08-04 NOTE — ED Provider Notes (Signed)
[facility] EMERGENCY DEPARTMENT Provider Note   CSN: [id number] Arrival date & time: 08/01/22  5621     History  Chief Complaint  Patient presents with   Emesis    Timothy [name] is a 26 y.o. male.   Emesis Associated symptoms: no abdominal pain, no chills, no diarrhea, no fever and no headaches         Timothy [name] is a 26 y.o. male with past medical history of alcohol abuse, alcoholic hepatitis, cannabinoid hyper emesis syndrome, who presents to the Emergency Department complaining of recurrent vomiting and nausea that began several hours prior to ER arrival.  Patient states that he has had multiple episodes of vomiting since onset.  Vomiting woke him from sleep.  Has been associated with some nausea, unable to keep down fluids.  He does admit to smoking marijuana and drinking beer on the evening prior to arrival.  He denies any diarrhea, abdominal pain, chest pain or shortness of breath.  He states his vomiting today is similar to previous episodes.   Home Medications Prior to Admission medications   Medication Sig Start Date End Date Taking? Authorizing Provider  omeprazole (PRILOSEC) 20 MG capsule Take 1 capsule (20 mg total) by mouth daily. 08/01/22  Yes Tykee Heideman, PA-C  ondansetron (ZOFRAN) 4 MG tablet Take 1 tablet (4 mg total) by mouth every 6 (six) hours. As needed for nausea vomiting 08/01/22  Yes Keana Dueitt, PA-C  albuterol (VENTOLIN HFA) 108 (90 Base) MCG/ACT inhaler Inhale 2 puffs into the lungs every 6 (six) hours as needed for wheezing. Patient not taking: Reported on 05/23/2022 08/28/21   Shon Hale, MD  chlordiazePOXIDE (LIBRIUM) 25 MG capsule 50mg  PO TID x 1D, then 25-50mg  PO BID X 1D, then 25-50mg  PO QD X 1D Patient not taking: Reported on 08/01/2022 05/25/22   07/26/22, MD  doxycycline (VIBRAMYCIN) 100 MG capsule Take 1 capsule (100 mg total) by mouth 2 (two) times daily. One po bid x 7 days Patient not taking: Reported on 05/23/2022 01/13/22    01/15/22, PA-C  folic acid (FOLVITE) 1 MG tablet Take 1 tablet (1 mg total) by mouth daily. Patient not taking: Reported on 05/23/2022 08/29/21   10/29/21, MD  LORazepam (ATIVAN) 1 MG tablet Take 1 tablet (1 mg total) by mouth every 6 (six) hours as needed (Nausea, shakiness). Patient not taking: Reported on 05/23/2022 10/14/21   10/16/21, MD  metoCLOPramide (REGLAN) 10 MG tablet Take 1 tablet (10 mg total) by mouth every 6 (six) hours. Patient not taking: Reported on 05/23/2022 12/06/21   12/08/21, PA-C  Multiple Vitamin (MULTIVITAMIN WITH MINERALS) TABS tablet Take 1 tablet by mouth daily. Patient not taking: Reported on 05/23/2022 08/29/21   10/29/21, MD  omeprazole (PRILOSEC OTC) 20 MG tablet Take 1 tablet (20 mg total) by mouth daily. Patient not taking: Reported on 05/23/2022 08/28/21 08/28/22  10/28/22, MD  ondansetron (ZOFRAN-ODT) 8 MG disintegrating tablet 8mg  ODT q4 hours prn nausea Patient not taking: Reported on 08/01/2022 05/25/22   10/01/2022, MD  thiamine 100 MG tablet Take 1 tablet (100 mg total) by mouth daily. Patient not taking: Reported on 05/23/2022 08/29/21   07/24/2022, MD      Allergies    Amoxil [amoxicillin]    Review of Systems   Review of Systems  Constitutional:  Negative for appetite change, chills and fever.  Respiratory:  Negative for shortness of breath.   Cardiovascular:  Negative for  chest pain.  Gastrointestinal:  Positive for nausea and vomiting. Negative for abdominal pain and diarrhea.  Genitourinary:  Negative for dysuria and flank pain.  Musculoskeletal:  Negative for back pain.  Neurological:  Negative for dizziness, syncope, weakness, numbness and headaches.    Physical Exam Updated Vital Signs BP (!) 139/94 (BP Location: Left Arm)   Pulse (!) 111   Temp 98.3 F (36.8 C) (Oral)   Resp 18   Ht 5\' 4"  (1.626 m)   Wt 72.6 kg   SpO2 100%   BMI 27.46 kg/m  Physical Exam Vitals and nursing note reviewed.   Constitutional:      General: He is not in acute distress.    Appearance: Normal appearance. He is not toxic-appearing.  HENT:     Head: Atraumatic.     Mouth/Throat:     Mouth: Mucous membranes are dry.  Eyes:     Extraocular Movements: Extraocular movements intact.     Conjunctiva/sclera: Conjunctivae normal.     Pupils: Pupils are equal, round, and reactive to light.  Cardiovascular:     Rate and Rhythm: Normal rate and regular rhythm.     Pulses: Normal pulses.  Pulmonary:     Effort: Pulmonary effort is normal.     Breath sounds: Normal breath sounds.  Abdominal:     General: There is no distension.     Palpations: Abdomen is soft.     Tenderness: There is no abdominal tenderness. There is no guarding or rebound.  Musculoskeletal:        General: Normal range of motion.  Skin:    General: Skin is warm.     Capillary Refill: Capillary refill takes less than 2 seconds.     Findings: No rash.  Neurological:     General: No focal deficit present.     Mental Status: He is alert.     Sensory: No sensory deficit.     Motor: No weakness.     ED Results / Procedures / Treatments   Labs (all labs ordered are listed, but only abnormal results are displayed) Labs Reviewed  CBC WITH DIFFERENTIAL/PLATELET - Abnormal; Notable for the following components:      Result Value   WBC 12.7 (*)    Neutro Abs 10.6 (*)    All other components within normal limits  COMPREHENSIVE METABOLIC PANEL - Abnormal; Notable for the following components:   CO2 20 (*)    Total Protein 8.9 (*)    Albumin 5.4 (*)    AST 59 (*)    ALT 71 (*)    Total Bilirubin 1.3 (*)    Anion gap 19 (*)    All other components within normal limits  ETHANOL - Abnormal; Notable for the following components:   Alcohol, Ethyl (B) 68 (*)    All other components within normal limits  LIPASE, BLOOD  MAGNESIUM    EKG None  Radiology No results found.  Procedures Procedures    Medications Ordered in  ED Medications  sodium chloride 0.9 % bolus 2,000 mL (0 mLs Intravenous Stopped 08/01/22 1412)  thiamine (VITAMIN B1) injection 100 mg (100 mg Intravenous Given 08/01/22 1041)  ondansetron (ZOFRAN) injection 4 mg (4 mg Intravenous Given 08/01/22 1040)  LORazepam (ATIVAN) injection 1 mg (1 mg Intravenous Given 08/01/22 1041)    ED Course/ Medical Decision Making/ A&P  Medical Decision Making Patient here with history of alcoholic hepatitis and cannabinoid hyperemesis syndrome.  Requesting evaluation of persistent vomiting, admits to drinking alcohol and smoking marijuana on the evening prior to arrival.  On exam, patient has ambulated to the restroom without difficulty.  Has had at least 1 episode of vomiting since arrival.  Abdominal exam reassuring, no peritoneal signs.  Mucous membranes are slightly dry.  Differential diagnosis would include but not limited to vomiting secondary to his cannabinoid hyperemesis, alcohol withdrawal, gastroenteritis, pancreatitis, viral process  Plan includes antiemetic, thiamine, IV fluids, will check labs.  Clinically, I suspect this is related to his recent marijuana use.  We will address his symptoms, try oral fluid challenge.  If feeling better I suspect he will be discharged home   Amount and/or Complexity of Data Reviewed Labs: ordered.    Details: Labs interpreted by me, mildly elevated leukocytosis, no evidence of anemia, blood alcohol of 68.  Lipase and magnesium levels unremarkable.  Chemistries show mildly elevated transaminases.  Total bilirubin 1.3.  Liver studies improved from labs 2 months ago. Radiology:     Details: Gallbladder ultrasound from early July without acute changes, results reviewed by me. Discussion of management or test interpretation with external provider(s): Patient here with vomiting felt to be secondary to his cannabinoid hyperemesis.  On recheck, he is feeling better.  No further vomiting.  He has  tolerated oral fluids.  Resting comfortably.  No clinical evidence of acute alcohol intoxication or withdrawal symptoms.  I feel that he is appropriate for discharge home.  Will provide prescription for Zofran if needed and PPI.  Counseled on alcohol and marijuana cessation.  Return precautions discussed.  Risk Prescription drug management.           Final Clinical Impression(s) / ED Diagnoses Final diagnoses:  Nausea and vomiting, unspecified vomiting type  History of alcoholic hepatitis    Rx / DC Orders ED Discharge Orders          Ordered    ondansetron (ZOFRAN) 4 MG tablet  Every 6 hours        08/01/22 1400    omeprazole (PRILOSEC) 20 MG capsule  Daily        08/01/22 1400              Pauline Aus, PA-C 08/04/22 1131    Loetta Rough, MD 08/04/22 1438

## 2022-09-19 ENCOUNTER — Encounter (HOSPITAL_COMMUNITY): Payer: Self-pay | Admitting: Emergency Medicine

## 2022-09-19 ENCOUNTER — Emergency Department (HOSPITAL_COMMUNITY)
Admission: EM | Admit: 2022-09-19 | Discharge: 2022-09-20 | Disposition: A | Discharge status: Home / Self Care | Payer: BC Managed Care – PPO | Source: Home / Self Care | Attending: Emergency Medicine | Admitting: Emergency Medicine

## 2022-09-19 ENCOUNTER — Other Ambulatory Visit: Payer: Self-pay

## 2022-09-19 ENCOUNTER — Emergency Department (HOSPITAL_COMMUNITY)
Admission: EM | Admit: 2022-09-19 | Discharge: 2022-09-19 | Disposition: A | Discharge status: Home / Self Care | Payer: BC Managed Care – PPO | Attending: Emergency Medicine | Admitting: Emergency Medicine

## 2022-09-19 ENCOUNTER — Emergency Department (HOSPITAL_COMMUNITY): Payer: BC Managed Care – PPO

## 2022-09-19 DIAGNOSIS — E876 Hypokalemia: Secondary | ICD-10-CM | POA: Insufficient documentation

## 2022-09-19 DIAGNOSIS — R112 Nausea with vomiting, unspecified: Secondary | ICD-10-CM | POA: Insufficient documentation

## 2022-09-19 DIAGNOSIS — R1030 Lower abdominal pain, unspecified: Secondary | ICD-10-CM | POA: Diagnosis not present

## 2022-09-19 DIAGNOSIS — R197 Diarrhea, unspecified: Secondary | ICD-10-CM | POA: Insufficient documentation

## 2022-09-19 DIAGNOSIS — R748 Abnormal levels of other serum enzymes: Secondary | ICD-10-CM | POA: Insufficient documentation

## 2022-09-19 DIAGNOSIS — D72829 Elevated white blood cell count, unspecified: Secondary | ICD-10-CM | POA: Diagnosis not present

## 2022-09-19 DIAGNOSIS — Y900 Blood alcohol level of less than 20 mg/100 ml: Secondary | ICD-10-CM | POA: Diagnosis not present

## 2022-09-19 LAB — ETHANOL: Alcohol, Ethyl (B): <10 mg/dL (ref <10)

## 2022-09-19 LAB — CBC
HCT: 44.6 % (ref 39.0–52.0)
Hemoglobin: 15.5 g/dL (ref 13.0–17.0)
MCH: 31.8 pg (ref 26.0–34.0)
MCHC: 34.8 g/dL (ref 30.0–36.0)
MCV: 91.6 fL (ref 80.0–100.0)
Platelets: 266 10*3/uL (ref 150–400)
RBC: 4.87 MIL/uL (ref 4.22–5.81)
RDW: 13.4 % (ref 11.5–15.5)
WBC: 14.7 10*3/uL — ABNORMAL HIGH (ref 4.0–10.5)
nRBC: 0 % (ref 0.0–0.2)

## 2022-09-19 LAB — COMPREHENSIVE METABOLIC PANEL
ALT: 54 U/L — ABNORMAL HIGH (ref 0–44)
AST: 51 U/L — ABNORMAL HIGH (ref 15–41)
Albumin: 5.2 g/dL — ABNORMAL HIGH (ref 3.5–5.0)
Alkaline Phosphatase: 79 U/L (ref 38–126)
Anion gap: 13 (ref 5–15)
BUN: 8 mg/dL (ref 6–20)
CO2: 22 mmol/L (ref 22–32)
Calcium: 9.8 mg/dL (ref 8.9–10.3)
Chloride: 101 mmol/L (ref 98–111)
Creatinine, Ser: 0.7 mg/dL (ref 0.61–1.24)
GFR, Estimated: >60 mL/min (ref >60)
Glucose, Bld: 125 mg/dL — ABNORMAL HIGH (ref 70–99)
Potassium: 3.4 mmol/L — ABNORMAL LOW (ref 3.5–5.1)
Sodium: 136 mmol/L (ref 135–145)
Total Bilirubin: 2 mg/dL — ABNORMAL HIGH (ref 0.3–1.2)
Total Protein: 8.4 g/dL — ABNORMAL HIGH (ref 6.5–8.1)

## 2022-09-19 LAB — LIPASE, BLOOD: Lipase: 27 U/L (ref 11–51)

## 2022-09-19 LAB — MAGNESIUM: Magnesium: 1.7 mg/dL (ref 1.7–2.4)

## 2022-09-19 MED ORDER — LORAZEPAM 2 MG/ML IJ SOLN
1.0000 mg | INTRAMUSCULAR | Status: DC | PRN
  Administered 2022-09-19: 1 mg via INTRAVENOUS
  Filled 2022-09-19: qty 1

## 2022-09-19 MED ORDER — ONDANSETRON HCL 4 MG PO TABS: 4.0000 mg | ORAL_TABLET | Freq: Four times a day (QID) | ORAL | 0 refills | Status: DC | PRN

## 2022-09-19 MED ORDER — LORAZEPAM 1 MG PO TABS: 1.0000 mg | ORAL_TABLET | ORAL | Status: DC | PRN

## 2022-09-19 MED ORDER — SODIUM CHLORIDE 0.9 % IV BOLUS
1000.0000 mL | Freq: Once | INTRAVENOUS | Status: AC
  Administered 2022-09-19: 1000 mL via INTRAVENOUS

## 2022-09-19 MED ORDER — FOLIC ACID 1 MG PO TABS
1.0000 mg | ORAL_TABLET | Freq: Every day | ORAL | Status: DC
  Administered 2022-09-19: 1 mg via ORAL
  Filled 2022-09-19: qty 1

## 2022-09-19 MED ORDER — PROMETHAZINE HCL 25 MG/ML IJ SOLN
25.0000 mg | Freq: Four times a day (QID) | INTRAMUSCULAR | Status: DC | PRN
  Administered 2022-09-19: 25 mg via INTRAMUSCULAR
  Filled 2022-09-19: qty 1

## 2022-09-19 MED ORDER — KETOROLAC TROMETHAMINE 60 MG/2ML IM SOLN
60.0000 mg | Freq: Once | INTRAMUSCULAR | Status: AC
  Administered 2022-09-19: 60 mg via INTRAMUSCULAR
  Filled 2022-09-19: qty 2

## 2022-09-19 MED ORDER — THIAMINE MONONITRATE 100 MG PO TABS: 100.0000 mg | ORAL_TABLET | Freq: Every day | ORAL | Status: DC

## 2022-09-19 MED ORDER — ONDANSETRON HCL 4 MG/2ML IJ SOLN
4.0000 mg | Freq: Once | INTRAMUSCULAR | Status: AC
  Administered 2022-09-19: 4 mg via INTRAVENOUS
  Filled 2022-09-19: qty 2

## 2022-09-19 MED ORDER — THIAMINE HCL 100 MG/ML IJ SOLN
100.0000 mg | Freq: Every day | INTRAMUSCULAR | Status: DC
  Administered 2022-09-19: 100 mg via INTRAVENOUS
  Filled 2022-09-19: qty 2

## 2022-09-19 MED ORDER — CHLORDIAZEPOXIDE HCL 25 MG PO CAPS: ORAL_CAPSULE | ORAL | 0 refills | Status: DC

## 2022-09-19 NOTE — ED Triage Notes (Signed)
Pt c/o n/v "twenty times" this am and diarrhea x 1. Denies black or bloody stools. Had pain to llq but none now. Denies gu sx. Mm moist. Color wnl.

## 2022-09-19 NOTE — ED Provider Notes (Signed)
[facility] EMERGENCY DEPARTMENT Provider Note   CSN: [id number] Arrival date & time: 09/19/22  2131     History  Chief Complaint  Patient presents with   Emesis    Timothy [name] is a 26 y.o. male.  Patient is a 26 year old male with past medical history of chronic alcoholism and daily marijuana use presenting with complaints of nausea and vomiting.  He was seen here several hours ago with similar complaints.  He was given IV fluids and medications, then was discharged.  Shortly after returning home, he began vomiting again.  Denies fevers or chills.  He denies ill contacts.  He has ODT Zofran at home which she says did not help.  The history is provided by the patient.       Home Medications Prior to Admission medications   Medication Sig Start Date End Date Taking? Authorizing Provider  chlordiazePOXIDE (LIBRIUM) 25 MG capsule 50mg  PO TID x 1D, then 25-50mg  PO BID X 1D, then 25-50mg  PO QD X 1D 09/19/22   Blue, Soijett A, PA-C  omeprazole (PRILOSEC) 20 MG capsule Take 1 capsule (20 mg total) by mouth daily. Patient not taking: Reported on 09/19/2022 08/01/22   Triplett, Tammy, PA-C  ondansetron (ZOFRAN) 4 MG tablet Take 1 tablet (4 mg total) by mouth every 6 (six) hours as needed for nausea or vomiting. As needed for nausea vomiting 09/19/22   Blue, Soijett A, PA-C      Allergies    Amoxil [amoxicillin]    Review of Systems   Review of Systems  All other systems reviewed and are negative.   Physical Exam Updated Vital Signs BP (!) 164/100 (BP Location: Right Arm)   Pulse 87   Temp 98.1 F (36.7 C) (Oral)   Resp 17   Ht 5\' 4"  (1.626 m)   Wt 72.6 kg   SpO2 95%   BMI 27.47 kg/m  Physical Exam Vitals and nursing note reviewed.  Constitutional:      General: He is not in acute distress.    Appearance: He is well-developed. He is not diaphoretic.  HENT:     Head: Normocephalic and atraumatic.  Cardiovascular:     Rate and Rhythm: Normal rate and regular rhythm.      Heart sounds: No murmur heard.    No friction rub.  Pulmonary:     Effort: Pulmonary effort is normal. No respiratory distress.     Breath sounds: Normal breath sounds. No wheezing or rales.  Abdominal:     General: Bowel sounds are normal. There is no distension.     Palpations: Abdomen is soft.     Tenderness: There is no abdominal tenderness.  Musculoskeletal:        General: Normal range of motion.     Cervical back: Normal range of motion and neck supple.  Skin:    General: Skin is warm and dry.  Neurological:     Mental Status: He is alert and oriented to person, place, and time.     Coordination: Coordination normal.     ED Results / Procedures / Treatments   Labs (all labs ordered are listed, but only abnormal results are displayed) Labs Reviewed - No data to display  EKG None  Radiology US Abdomen Complete  Result Date: 09/19/2022 CLINICAL DATA:  Elevated LFT.  Vomiting EXAM: ABDOMEN ULTRASOUND COMPLETE COMPARISON:  CT abdomen pelvis 12/05/2021. Right upper quadrant ultrasound 05/23/2022 FINDINGS: Gallbladder: No gallstones or wall thickening visualized. No sonographic Murphy sign noted  by sonographer. Common bile duct: Diameter: 2.7 mm Liver: Diffusely increased echogenicity liver unchanged. Portal vein is patent on color Doppler imaging with normal direction of blood flow towards the liver. IVC: No abnormality visualized. Pancreas: Visualized portion unremarkable. Spleen: Size and appearance within normal limits. Right Kidney: Length: 10.5 cm. Echogenicity within normal limits. No mass or hydronephrosis visualized. Left Kidney: Length: 11.3 cm. Echogenicity within normal limits. No mass or hydronephrosis visualized. Abdominal aorta: No aneurysm visualized. Other findings: None. IMPRESSION: 1. Negative for gallstones. 2. Diffusely increased echogenicity in the liver unchanged. This finding is nonspecific but may be seen with hepatic steatosis. Electronically Signed    By: Franchot Gallo M.D.   On: 09/19/2022 17:46    Procedures Procedures    Medications Ordered in ED Medications  ketorolac (TORADOL) injection 60 mg (has no administration in time range)  promethazine (PHENERGAN) injection 25 mg (has no administration in time range)    ED Course/ Medical Decision Making/ A&P  Patient returns here with complaints of nausea and vomiting hours after being evaluated for the same complaint.  He had laboratory studies, IV fluids, and medications, then was discharged.  He states that he began vomiting again after returning home.  Patient was given Toradol and Phenergan and is now sleeping soundly in the exam room.  I feel as though he can safely be discharged.  I did counsel the patient about his marijuana use and how this may be causing his vomiting as he has been here multiple times with this same complaint and also admits to daily marijuana use.  Final Clinical Impression(s) / ED Diagnoses Final diagnoses:  None    Rx / DC Orders ED Discharge Orders     None         Veryl Speak, MD 09/19/22 2357

## 2022-09-19 NOTE — ED Triage Notes (Signed)
Pt c/o emesis since being discharged for same about 1 hr ago. Pt states he hadn't went to get his prescriptions filled at the pharmacy yet.

## 2022-09-19 NOTE — Discharge Instructions (Signed)
Continue using Zofran as before.  Clear liquids for the next 12 hours, then advance diet as tolerated.  Refrain from daily marijuana use as I suspect this is the cause of your vomiting episodes.

## 2022-09-19 NOTE — Discharge Instructions (Addendum)
It was a pleasure taking care of you today!  Your labs today were overall unremarkable.  Your ultrasound did not show any concerning findings today.  Ensure to maintain fluid intake. You will be sent a prescription for Librium, take as directed. You will also be sent a prescription for zofran to take as needed for nausea/vomiting. Ensure to maintain fluid intake. Return to the ED if you are experiencing increasing/worsening symptoms.

## 2022-09-19 NOTE — ED Provider Notes (Signed)
[facility] EMERGENCY DEPARTMENT Provider Note   CSN: [id number] Arrival date & time: 09/19/22  1445     History  Chief Complaint  Patient presents with   Emesis   Diarrhea    Timothy [name] is a 26 y.o. male who presents emergency department with concerns for emesis onset this morning.  Has associated watery diarrhea onset this morning, nausea, lower abdominal pain.  No meds tried prior to arrival.  Denies sick contacts.  Notes history of similar symptoms.  Patient notes marijuana use.  Notes that he consumes a 12 pack of beer daily with his last use being yesterday.  Does not have a primary care provider at this time.  The history is provided by the patient. No language interpreter was used.       Home Medications Prior to Admission medications   Medication Sig Start Date End Date Taking? Authorizing Provider  chlordiazePOXIDE (LIBRIUM) 25 MG capsule 50mg  PO TID x 1D, then 25-50mg  PO BID X 1D, then 25-50mg  PO QD X 1D 09/19/22  Yes Fumie Fiallo A, PA-C  omeprazole (PRILOSEC) 20 MG capsule Take 1 capsule (20 mg total) by mouth daily. Patient not taking: Reported on 09/19/2022 08/01/22   Triplett, Tammy, PA-C  ondansetron (ZOFRAN) 4 MG tablet Take 1 tablet (4 mg total) by mouth every 6 (six) hours as needed for nausea or vomiting. As needed for nausea vomiting 09/19/22   Rukia Mcgillivray A, PA-C      Allergies    Amoxil [amoxicillin]    Review of Systems   Review of Systems  Gastrointestinal:  Positive for diarrhea and vomiting.  All other systems reviewed and are negative.   Physical Exam Updated Vital Signs BP (!) 148/98 (BP Location: Right Arm)   Pulse 98   Temp 98.1 F (36.7 C) (Oral)   Resp 18   SpO2 100%  Physical Exam Vitals and nursing note reviewed.  Constitutional:      General: He is not in acute distress.    Appearance: He is not diaphoretic.  HENT:     Head: Normocephalic and atraumatic.     Mouth/Throat:     Pharynx: No oropharyngeal exudate.  Eyes:      General: No scleral icterus.    Conjunctiva/sclera: Conjunctivae normal.  Cardiovascular:     Rate and Rhythm: Normal rate and regular rhythm.     Pulses: Normal pulses.     Heart sounds: Normal heart sounds.  Pulmonary:     Effort: Pulmonary effort is normal. No respiratory distress.     Breath sounds: Normal breath sounds. No wheezing.  Abdominal:     General: Bowel sounds are normal.     Palpations: Abdomen is soft. There is no mass.     Tenderness: There is no abdominal tenderness. There is no guarding or rebound.  Musculoskeletal:        General: Normal range of motion.     Cervical back: Normal range of motion and neck supple.  Skin:    General: Skin is warm and dry.  Neurological:     Mental Status: He is alert.  Psychiatric:        Behavior: Behavior normal.     ED Results / Procedures / Treatments   Labs (all labs ordered are listed, but only abnormal results are displayed) Labs Reviewed  COMPREHENSIVE METABOLIC PANEL - Abnormal; Notable for the following components:      Result Value   Potassium 3.4 (*)    Glucose, Bld 125 (*)  Total Protein 8.4 (*)    Albumin 5.2 (*)    AST 51 (*)    ALT 54 (*)    Total Bilirubin 2.0 (*)    All other components within normal limits  CBC - Abnormal; Notable for the following components:   WBC 14.7 (*)    All other components within normal limits  LIPASE, BLOOD  ETHANOL  MAGNESIUM  URINALYSIS, ROUTINE W REFLEX MICROSCOPIC    EKG None  Radiology US Abdomen Complete  Result Date: 09/19/2022 CLINICAL DATA:  Elevated LFT.  Vomiting EXAM: ABDOMEN ULTRASOUND COMPLETE COMPARISON:  CT abdomen pelvis 12/05/2021. Right upper quadrant ultrasound 05/23/2022 FINDINGS: Gallbladder: No gallstones or wall thickening visualized. No sonographic Murphy sign noted by sonographer. Common bile duct: Diameter: 2.7 mm Liver: Diffusely increased echogenicity liver unchanged. Portal vein is patent on color Doppler imaging with normal  direction of blood flow towards the liver. IVC: No abnormality visualized. Pancreas: Visualized portion unremarkable. Spleen: Size and appearance within normal limits. Right Kidney: Length: 10.5 cm. Echogenicity within normal limits. No mass or hydronephrosis visualized. Left Kidney: Length: 11.3 cm. Echogenicity within normal limits. No mass or hydronephrosis visualized. Abdominal aorta: No aneurysm visualized. Other findings: None. IMPRESSION: 1. Negative for gallstones. 2. Diffusely increased echogenicity in the liver unchanged. This finding is nonspecific but may be seen with hepatic steatosis. Electronically Signed   By: Franchot Gallo M.D.   On: 09/19/2022 17:46    Procedures Procedures    Medications Ordered in ED Medications  ondansetron (ZOFRAN) injection 4 mg (4 mg Intravenous Given 09/19/22 1531)  sodium chloride 0.9 % bolus 1,000 mL (0 mLs Intravenous Stopped 09/19/22 1704)  sodium chloride 0.9 % bolus 1,000 mL (0 mLs Intravenous Stopped 09/19/22 2000)    ED Course/ Medical Decision Making/ A&P Clinical Course as of 09/19/22 2253  Tue Sep 19, 2022  1707 Re-evaluated and patient noted that he still does not feel well at this time. [SB]  1737 Pt re-evaluated and noted improvement of symptoms with treatment regimen in the ED. [SB]  1914 Notified by RN that patient has been forcing himself to throw up during his ED visit "in order to get all of it out".  Patient reevaluated and resting comfortably on the stretcher. Discussed with patient discharge treatment plan consisting of prescription for Librium, patient agreeable at this time.  Patient appears safe for discharge at this time. [SB]    Clinical Course User Index [SB] Timmey Lamba A, PA-C                           Medical Decision Making Amount and/or Complexity of Data Reviewed Labs: ordered. Radiology: ordered.  Risk Prescription drug management.   Patient presents to the emergency department with concerns for nausea,  vomiting onset today.  Denies sick contacts.  Patient afebrile.  On exam patient with no acute cardiovascular, respiratory, dull exam findings.  Differential diagnosis includes cannabinoid hyperemesis syndrome, alcohol withdrawal, pancreatitis, cholecystitis, appendicitis.  Additional history obtained:  External records from outside source obtained and reviewed including: Patient has been evaluated multiple times in the ED for similar concerns.  Labs:  I ordered, and personally interpreted labs.  The pertinent results include:  Alcohol undetectable. Magnesium at 1.7 Lipase unremarkable. CBC with mildly elevated WBC of 14.7 otherwise unremarkable. CMP with slightly decreased potassium at 3.4, liver enzymes slightly elevated however vastly improved from baseline.  Total bili elevated to 2.0  Imaging: I ordered imaging studies including  ultrasound abdomen I independently visualized and interpreted imaging which showed  1. Negative for gallstones.  2. Diffusely increased echogenicity in the liver unchanged. This  finding is nonspecific but may be seen with hepatic steatosis.   I agree with the radiologist interpretation  Medications:  I ordered medication including IV fluids x2, Zofran, Ativan, thiamine, folic acid  for symptom management. Reevaluation of the patient after these medicines and interventions, I reevaluated the patient and found that they have improved I have reviewed the patients home medicines and have made adjustments as needed Tolerated PO challenge in ED with above treatment regimen    Disposition: Presentation suspicious for likely nausea and vomiting in the setting of likely cannabinoid hyperemesis syndrome. Patient with a CIWA of 5, patient provided with 1 mg of Ativan, thiamine and folic acid.  Doubt pancreatitis, cholecystitis, appendicitis at this time.  After consideration of the diagnostic results and the patients response to treatment, I feel that the patient  would benefit from Discharge home.  Patient discharged home with a prescription for Zofran as well as Librium. Supportive care measures and strict return precautions discussed with patient at bedside. Pt acknowledges and verbalizes understanding. Pt appears safe for discharge. Follow up as indicated in discharge paperwork.   This chart was dictated using voice recognition software, Dragon. Despite the best efforts of this provider to proofread and correct errors, errors may still occur which can change documentation meaning.  Final Clinical Impression(s) / ED Diagnoses Final diagnoses:  Nausea and vomiting, unspecified vomiting type    Rx / DC Orders ED Discharge Orders          Ordered    chlordiazePOXIDE (LIBRIUM) 25 MG capsule        09/19/22 1956    ondansetron (ZOFRAN) 4 MG tablet  Every 6 hours PRN        09/19/22 1956              Chaz Ronning A, PA-C 09/19/22 2257    Bethann Berkshire, MD 09/20/22 1245

## 2022-09-20 ENCOUNTER — Emergency Department (HOSPITAL_COMMUNITY)
Admission: EM | Admit: 2022-09-20 | Discharge: 2022-09-20 | Disposition: A | Discharge status: Home / Self Care | Payer: BC Managed Care – PPO | Attending: Emergency Medicine | Admitting: Emergency Medicine

## 2022-09-20 ENCOUNTER — Encounter (HOSPITAL_COMMUNITY): Payer: Self-pay

## 2022-09-20 DIAGNOSIS — R112 Nausea with vomiting, unspecified: Secondary | ICD-10-CM

## 2022-09-20 DIAGNOSIS — F1010 Alcohol abuse, uncomplicated: Secondary | ICD-10-CM | POA: Insufficient documentation

## 2022-09-20 DIAGNOSIS — R109 Unspecified abdominal pain: Secondary | ICD-10-CM | POA: Diagnosis present

## 2022-09-20 DIAGNOSIS — F12188 Cannabis abuse with other cannabis-induced disorder: Secondary | ICD-10-CM | POA: Diagnosis not present

## 2022-09-20 DIAGNOSIS — E876 Hypokalemia: Secondary | ICD-10-CM | POA: Diagnosis not present

## 2022-09-20 LAB — CBC WITH DIFFERENTIAL/PLATELET
Abs Immature Granulocytes: 0.04 10*3/uL (ref 0.00–0.07)
Basophils Absolute: 0 10*3/uL (ref 0.0–0.1)
Basophils Relative: 0 %
Eosinophils Absolute: 0 10*3/uL (ref 0.0–0.5)
Eosinophils Relative: 0 %
HCT: 47.5 % (ref 39.0–52.0)
Hemoglobin: 16.3 g/dL (ref 13.0–17.0)
Immature Granulocytes: 0 %
Lymphocytes Relative: 7 %
Lymphs Abs: 0.8 10*3/uL (ref 0.7–4.0)
MCH: 32 pg (ref 26.0–34.0)
MCHC: 34.3 g/dL (ref 30.0–36.0)
MCV: 93.3 fL (ref 80.0–100.0)
Monocytes Absolute: 1.4 10*3/uL — ABNORMAL HIGH (ref 0.1–1.0)
Monocytes Relative: 13 %
Neutro Abs: 8.7 10*3/uL — ABNORMAL HIGH (ref 1.7–7.7)
Neutrophils Relative %: 80 %
Platelets: 236 10*3/uL (ref 150–400)
RBC: 5.09 MIL/uL (ref 4.22–5.81)
RDW: 13.3 % (ref 11.5–15.5)
WBC: 11 10*3/uL — ABNORMAL HIGH (ref 4.0–10.5)
nRBC: 0 % (ref 0.0–0.2)

## 2022-09-20 LAB — COMPREHENSIVE METABOLIC PANEL
ALT: 50 U/L — ABNORMAL HIGH (ref 0–44)
AST: 69 U/L — ABNORMAL HIGH (ref 15–41)
Albumin: 4.8 g/dL (ref 3.5–5.0)
Alkaline Phosphatase: 73 U/L (ref 38–126)
Anion gap: 11 (ref 5–15)
BUN: 7 mg/dL (ref 6–20)
CO2: 30 mmol/L (ref 22–32)
Calcium: 9.6 mg/dL (ref 8.9–10.3)
Chloride: 96 mmol/L — ABNORMAL LOW (ref 98–111)
Creatinine, Ser: 0.85 mg/dL (ref 0.61–1.24)
GFR, Estimated: >60 mL/min (ref >60)
Glucose, Bld: 102 mg/dL — ABNORMAL HIGH (ref 70–99)
Potassium: 2.8 mmol/L — ABNORMAL LOW (ref 3.5–5.1)
Sodium: 137 mmol/L (ref 135–145)
Total Bilirubin: 2.3 mg/dL — ABNORMAL HIGH (ref 0.3–1.2)
Total Protein: 8.3 g/dL — ABNORMAL HIGH (ref 6.5–8.1)

## 2022-09-20 LAB — ETHANOL: Alcohol, Ethyl (B): <10 mg/dL (ref <10)

## 2022-09-20 LAB — LIPASE, BLOOD: Lipase: 25 U/L (ref 11–51)

## 2022-09-20 MED ORDER — HALOPERIDOL LACTATE 5 MG/ML IJ SOLN
2.5000 mg | Freq: Once | INTRAMUSCULAR | Status: AC
  Administered 2022-09-20: 2.5 mg via INTRAVENOUS
  Filled 2022-09-20: qty 1

## 2022-09-20 MED ORDER — FAMOTIDINE IN NACL 20-0.9 MG/50ML-% IV SOLN
20.0000 mg | Freq: Once | INTRAVENOUS | Status: AC
  Administered 2022-09-20: 20 mg via INTRAVENOUS
  Filled 2022-09-20: qty 50

## 2022-09-20 MED ORDER — LACTATED RINGERS IV BOLUS
1000.0000 mL | Freq: Once | INTRAVENOUS | Status: AC
  Administered 2022-09-20: 1000 mL via INTRAVENOUS

## 2022-09-20 MED ORDER — POTASSIUM CHLORIDE 10 MEQ/100ML IV SOLN
10.0000 meq | INTRAVENOUS | Status: AC
  Administered 2022-09-20 (×2): 10 meq via INTRAVENOUS
  Filled 2022-09-20 (×2): qty 100

## 2022-09-20 MED ORDER — POTASSIUM CHLORIDE CRYS ER 20 MEQ PO TBCR
40.0000 meq | EXTENDED_RELEASE_TABLET | Freq: Once | ORAL | Status: AC
  Administered 2022-09-20: 40 meq via ORAL
  Filled 2022-09-20: qty 2

## 2022-09-20 MED ORDER — MAGNESIUM SULFATE 2 GM/50ML IV SOLN
2.0000 g | INTRAVENOUS | Status: AC
  Administered 2022-09-20: 2 g via INTRAVENOUS
  Filled 2022-09-20: qty 50

## 2022-09-20 MED ORDER — DIPHENHYDRAMINE HCL 50 MG/ML IJ SOLN
12.5000 mg | Freq: Once | INTRAMUSCULAR | Status: AC
  Administered 2022-09-20: 12.5 mg via INTRAVENOUS
  Filled 2022-09-20: qty 1

## 2022-09-20 NOTE — ED Notes (Signed)
Pt says he is unable to provide urine sample at this time. 

## 2022-09-20 NOTE — ED Triage Notes (Signed)
Pt seen here yesterday, c/o vomiting. Last smoked marijuana on Monday

## 2022-09-20 NOTE — ED Provider Notes (Signed)
[facility] EMERGENCY DEPARTMENT Provider Note   CSN: [id number] Arrival date & time: 09/20/22  4742     History {Add pertinent medical, surgical, social history, OB history to HPI:1} Chief Complaint  Patient presents with  . Emesis    Timothy [name] is a 26 y.o. male.  The history is provided by the patient and medical records.  Emesis Severity:  Severe Duration:  24 hours Timing:  Intermittent Quality:  Stomach contents Able to tolerate:  Liquids and solids Progression:  Unchanged Chronicity:  Recurrent Relieved by:  Nothing Ineffective treatments:  Antiemetics Associated symptoms: abdominal pain   Abdominal pain:    Location:  Epigastric   Quality: burning     Severity:  Moderate   Timing:  Constant   Progression:  Unchanged   Chronicity:  Recurrent Risk factors: alcohol use   Risk factors comment:  Chronic alcohol and marijuana abuse      Home Medications Prior to Admission medications   Medication Sig Start Date End Date Taking? Authorizing Provider  ondansetron (ZOFRAN) 4 MG tablet Take 1 tablet (4 mg total) by mouth every 6 (six) hours as needed for nausea or vomiting. As needed for nausea vomiting 09/19/22  Yes Blue, Soijett A, PA-C  chlordiazePOXIDE (LIBRIUM) 25 MG capsule 50mg  PO TID x 1D, then 25-50mg  PO BID X 1D, then 25-50mg  PO QD X 1D Patient not taking: Reported on 09/20/2022 09/19/22   Blue, Soijett A, PA-C  omeprazole (PRILOSEC) 20 MG capsule Take 1 capsule (20 mg total) by mouth daily. Patient not taking: Reported on 09/19/2022 08/01/22   10/01/22, PA-C      Allergies    Amoxil [amoxicillin]    Review of Systems   Review of Systems  Gastrointestinal:  Positive for abdominal pain and vomiting.    Physical Exam Updated Vital Signs BP (!) 162/108 (BP Location: Right Arm)   Pulse 84   Temp 98.2 F (36.8 C) (Oral)   Resp 15   Ht 5\' 4"  (1.626 m)   Wt 72.6 kg   SpO2 99%   BMI 27.46 kg/m  Physical Exam Vitals and nursing note  reviewed.  Constitutional:      General: He is not in acute distress.    Appearance: He is well-developed. He is not diaphoretic.  HENT:     Head: Normocephalic and atraumatic.  Eyes:     General: No scleral icterus.    Conjunctiva/sclera: Conjunctivae normal.  Cardiovascular:     Rate and Rhythm: Normal rate and regular rhythm.     Heart sounds: Normal heart sounds.  Pulmonary:     Effort: Pulmonary effort is normal. No respiratory distress.     Breath sounds: Normal breath sounds.  Abdominal:     Palpations: Abdomen is soft.     Tenderness: There is abdominal tenderness (epigastric).  Musculoskeletal:     Cervical back: Normal range of motion and neck supple.  Skin:    General: Skin is warm and dry.  Neurological:     Mental Status: He is alert.  Psychiatric:        Behavior: Behavior normal.    ED Results / Procedures / Treatments   Labs (all labs ordered are listed, but only abnormal results are displayed) Labs Reviewed  COMPREHENSIVE METABOLIC PANEL  CBC WITH DIFFERENTIAL/PLATELET  URINALYSIS, ROUTINE W REFLEX MICROSCOPIC  RAPID URINE DRUG SCREEN, HOSP PERFORMED  ETHANOL    EKG None  Radiology Pauline Aus Abdomen Complete  Result Date: 09/19/2022 CLINICAL DATA:  Elevated  LFT.  Vomiting EXAM: ABDOMEN ULTRASOUND COMPLETE COMPARISON:  CT abdomen pelvis 12/05/2021. Right upper quadrant ultrasound 05/23/2022 FINDINGS: Gallbladder: No gallstones or wall thickening visualized. No sonographic Murphy sign noted by sonographer. Common bile duct: Diameter: 2.7 mm Liver: Diffusely increased echogenicity liver unchanged. Portal vein is patent on color Doppler imaging with normal direction of blood flow towards the liver. IVC: No abnormality visualized. Pancreas: Visualized portion unremarkable. Spleen: Size and appearance within normal limits. Right Kidney: Length: 10.5 cm. Echogenicity within normal limits. No mass or hydronephrosis visualized. Left Kidney: Length: 11.3 cm. Echogenicity  within normal limits. No mass or hydronephrosis visualized. Abdominal aorta: No aneurysm visualized. Other findings: None. IMPRESSION: 1. Negative for gallstones. 2. Diffusely increased echogenicity in the liver unchanged. This finding is nonspecific but may be seen with hepatic steatosis. Electronically Signed   By: Franchot Gallo M.D.   On: 09/19/2022 17:46    Procedures Procedures  {Document cardiac monitor, telemetry assessment procedure when appropriate:1}  Medications Ordered in ED Medications  lactated ringers bolus 1,000 mL (has no administration in time range)    ED Course/ Medical Decision Making/ A&P Clinical Course as of 09/20/22 1109  Wed Sep 20, 2022  1107 Potassium(!): 2.8 [AH]    Clinical Course User Index [AH] Margarita Mail, PA-C                           Medical Decision Making Amount and/or Complexity of Data Reviewed Labs: ordered. ECG/medicine tests: ordered.   ***  {Document critical care time when appropriate:1} {Document review of labs and clinical decision tools ie heart score, Chads2Vasc2 etc:1}  {Document your independent review of radiology images, and any outside records:1} {Document your discussion with family members, caretakers, and with consultants:1} {Document social determinants of health affecting pt's care:1} {Document your decision making why or why not admission, treatments were needed:1} Final Clinical Impression(s) / ED Diagnoses Final diagnoses:  None    Rx / DC Orders ED Discharge Orders     None

## 2022-09-20 NOTE — Discharge Instructions (Addendum)
STOP drinking alcohol and smoking weed ! If you completely stop doing both of these activities , your life will improve and you won't repeatedly return to the er for vomiting. You do not have food poisoning this is only from your substance abuse issues. I have provided you with a list of substance abuse resources.  Continue frequent small sips (10-20 ml) of clear liquids every 5-10 minutes. Gatorade or powerade are good options. Avoid milk, orange juice, and grape juice for now. . Once you have not had further vomiting with the small sips for 4 hours, you may begin to drink larger volumes of fluids at a time and try a bland diet which may include saltine crackers, applesauce, breads, pastas, bananas, bland chicken. If you continues to vomit despite medication, return to the ED for repeat evaluation. Otherwise, follow up with your doctor in 2-3 days for a re-check.

## 2022-09-20 NOTE — ED Notes (Signed)
Pt attempted to collect UA with no success at this time. PA notified.

## 2022-11-01 ENCOUNTER — Other Ambulatory Visit: Payer: Self-pay

## 2022-11-01 ENCOUNTER — Encounter (HOSPITAL_COMMUNITY): Payer: Self-pay | Admitting: Emergency Medicine

## 2022-11-01 ENCOUNTER — Emergency Department (HOSPITAL_COMMUNITY)
Admission: EM | Admit: 2022-11-01 | Discharge: 2022-11-01 | Disposition: A | Discharge status: Home / Self Care | Payer: BC Managed Care – PPO | Attending: Emergency Medicine | Admitting: Emergency Medicine

## 2022-11-01 DIAGNOSIS — Z1152 Encounter for screening for COVID-19: Secondary | ICD-10-CM | POA: Insufficient documentation

## 2022-11-01 DIAGNOSIS — R112 Nausea with vomiting, unspecified: Secondary | ICD-10-CM | POA: Diagnosis present

## 2022-11-01 DIAGNOSIS — F17200 Nicotine dependence, unspecified, uncomplicated: Secondary | ICD-10-CM | POA: Insufficient documentation

## 2022-11-01 DIAGNOSIS — A084 Viral intestinal infection, unspecified: Secondary | ICD-10-CM | POA: Insufficient documentation

## 2022-11-01 LAB — RESP PANEL BY RT-PCR (RSV, FLU A&B, COVID)  RVPGX2
Influenza A by PCR: NEGATIVE
Influenza B by PCR: NEGATIVE
Resp Syncytial Virus by PCR: NEGATIVE
SARS Coronavirus 2 by RT PCR: NEGATIVE

## 2022-11-01 LAB — CBC
HCT: 51 % (ref 39.0–52.0)
Hemoglobin: 17.1 g/dL — ABNORMAL HIGH (ref 13.0–17.0)
MCH: 31.7 pg (ref 26.0–34.0)
MCHC: 33.5 g/dL (ref 30.0–36.0)
MCV: 94.6 fL (ref 80.0–100.0)
Platelets: 305 10*3/uL (ref 150–400)
RBC: 5.39 MIL/uL (ref 4.22–5.81)
RDW: 13.3 % (ref 11.5–15.5)
WBC: 9.1 10*3/uL (ref 4.0–10.5)
nRBC: 0 % (ref 0.0–0.2)

## 2022-11-01 LAB — COMPREHENSIVE METABOLIC PANEL
ALT: 39 U/L (ref 0–44)
AST: 46 U/L — ABNORMAL HIGH (ref 15–41)
Albumin: 5.4 g/dL — ABNORMAL HIGH (ref 3.5–5.0)
Alkaline Phosphatase: 94 U/L (ref 38–126)
Anion gap: 22 — ABNORMAL HIGH (ref 5–15)
BUN: 11 mg/dL (ref 6–20)
CO2: 18 mmol/L — ABNORMAL LOW (ref 22–32)
Calcium: 10.4 mg/dL — ABNORMAL HIGH (ref 8.9–10.3)
Chloride: 100 mmol/L (ref 98–111)
Creatinine, Ser: 0.85 mg/dL (ref 0.61–1.24)
GFR, Estimated: >60 mL/min (ref >60)
Glucose, Bld: 94 mg/dL (ref 70–99)
Potassium: 3.9 mmol/L (ref 3.5–5.1)
Sodium: 140 mmol/L (ref 135–145)
Total Bilirubin: 2 mg/dL — ABNORMAL HIGH (ref 0.3–1.2)
Total Protein: 9 g/dL — ABNORMAL HIGH (ref 6.5–8.1)

## 2022-11-01 LAB — LIPASE, BLOOD: Lipase: 24 U/L (ref 11–51)

## 2022-11-01 MED ORDER — DIPHENHYDRAMINE HCL 50 MG/ML IJ SOLN
12.5000 mg | Freq: Once | INTRAMUSCULAR | Status: AC
  Administered 2022-11-01: 12.5 mg via INTRAVENOUS
  Filled 2022-11-01: qty 1

## 2022-11-01 MED ORDER — LOPERAMIDE HCL 2 MG PO CAPS
4.0000 mg | ORAL_CAPSULE | Freq: Once | ORAL | Status: AC
  Administered 2022-11-01: 4 mg via ORAL
  Filled 2022-11-01: qty 2

## 2022-11-01 MED ORDER — SODIUM CHLORIDE 0.9 % IV BOLUS
1000.0000 mL | Freq: Once | INTRAVENOUS | Status: AC
  Administered 2022-11-01: 1000 mL via INTRAVENOUS

## 2022-11-01 MED ORDER — METOCLOPRAMIDE HCL 10 MG PO TABS: 10.0000 mg | ORAL_TABLET | Freq: Three times a day (TID) | ORAL | 0 refills | Status: DC | PRN

## 2022-11-01 MED ORDER — ONDANSETRON HCL 4 MG/2ML IJ SOLN
4.0000 mg | Freq: Once | INTRAMUSCULAR | Status: AC
  Administered 2022-11-01: 4 mg via INTRAVENOUS
  Filled 2022-11-01: qty 2

## 2022-11-01 MED ORDER — METOCLOPRAMIDE HCL 5 MG/ML IJ SOLN
10.0000 mg | Freq: Once | INTRAMUSCULAR | Status: AC
  Administered 2022-11-01: 10 mg via INTRAVENOUS
  Filled 2022-11-01: qty 2

## 2022-11-01 MED ORDER — LOPERAMIDE HCL 2 MG PO CAPS: 2.0000 mg | ORAL_CAPSULE | Freq: Four times a day (QID) | ORAL | 0 refills | Status: DC | PRN

## 2022-11-01 MED ORDER — ONDANSETRON 4 MG PO TBDP
4.0000 mg | ORAL_TABLET | Freq: Once | ORAL | Status: AC | PRN
  Administered 2022-11-01: 4 mg via ORAL
  Filled 2022-11-01: qty 1

## 2022-11-01 NOTE — ED Provider Notes (Signed)
[facility] EMERGENCY DEPARTMENT Provider Note   CSN: [id number] Arrival date & time: 11/01/22  1046     History  Chief Complaint  Patient presents with   Emesis   Diarrhea    Timothy [name] is a 26 y.o. male with a history including alcohol abuse, marijuana use, distant history of febrile seizures presenting for evaluation of nausea, vomiting and diarrhea which started early this morning.  He reports 4 episodes of vomiting prior to arrival, 3 episodes of diarrhea.  He denies hematemesis, also denies chest pain or shortness of breath.  He does endorse mid abdominal cramping pain which is relieved by vomiting or a BM.  He states another family member has similar symptoms.  He does have history of complications from alcohol abuse, but states he did not have much EtOH intake yesterday and does not feel his symptoms are related to his drinking.  Has had no medications for symptoms prior to arrival.  The history is provided by the patient.       Home Medications Prior to Admission medications   Medication Sig Start Date End Date Taking? Authorizing Provider  loperamide (IMODIUM) 2 MG capsule Take 1 capsule (2 mg total) by mouth 4 (four) times daily as needed for diarrhea or loose stools. 11/01/22  Yes Timothy [name]  metoCLOPramide (REGLAN) 10 MG tablet Take 1 tablet (10 mg total) by mouth every 8 (eight) hours as needed for nausea or vomiting. 11/01/22  Yes Timothy [name]  chlordiazePOXIDE (LIBRIUM) 25 MG capsule 25m PO TID x 1D, then 25-52mPO BID X 1D, then 25-509mO QD X 1D Patient not taking: Reported on 09/20/2022 09/19/22   Timothy [name]  omeprazole (PRILOSEC) 20 MG capsule Take 1 capsule (20 mg total) by mouth daily. Patient not taking: Reported on 09/19/2022 08/01/22   Timothy [name]  ondansetron (ZOFRAN) 4 MG tablet Take 1 tablet (4 mg total) by mouth every 6 (six) hours as needed for nausea or vomiting. As needed for nausea vomiting 09/19/22   Blue, Soijett  A, [name]      Allergies    Amoxil [amoxicillin]    Review of Systems   Review of Systems  Constitutional:  Negative for chills and fever.  HENT:  Negative for congestion and sore throat.   Eyes: Negative.   Respiratory:  Negative for chest tightness and shortness of breath.   Cardiovascular:  Negative for chest pain.  Gastrointestinal:  Positive for abdominal pain, diarrhea, nausea and vomiting.  Genitourinary: Negative.   Musculoskeletal:  Negative for arthralgias, joint swelling and neck pain.  Skin: Negative.  Negative for rash and wound.  Neurological:  Negative for dizziness, weakness, light-headedness, numbness and headaches.  Psychiatric/Behavioral: Negative.    All other systems reviewed and are negative.   Physical Exam Updated Vital Signs BP (!) 142/92 (BP Location: Right Arm)   Pulse 71   Temp 97.8 F (36.6 C) (Oral)   Resp 18   SpO2 100%  Physical Exam Vitals and nursing note reviewed.  Constitutional:      Appearance: He is well-developed.  HENT:     Head: Normocephalic and atraumatic.  Eyes:     Conjunctiva/sclera: Conjunctivae normal.  Cardiovascular:     Rate and Rhythm: Normal rate and regular rhythm.     Heart sounds: Normal heart sounds.  Pulmonary:     Effort: Pulmonary effort is normal.     Breath sounds: Normal breath sounds. No wheezing.  Abdominal:  General: Bowel sounds are normal. There is no distension.     Palpations: Abdomen is soft.     Tenderness: There is no abdominal tenderness. There is no guarding.  Musculoskeletal:        General: Normal range of motion.     Cervical back: Normal range of motion.  Skin:    General: Skin is warm and dry.  Neurological:     Mental Status: He is alert.     ED Results / Procedures / Treatments   Labs (all labs ordered are listed, but only abnormal results are displayed) Labs Reviewed  COMPREHENSIVE METABOLIC PANEL - Abnormal; Notable for the following components:      Result Value   CO2  18 (*)    Calcium 10.4 (*)    Total Protein 9.0 (*)    Albumin 5.4 (*)    AST 46 (*)    Total Bilirubin 2.0 (*)    Anion gap 22 (*)    All other components within normal limits  CBC - Abnormal; Notable for the following components:   Hemoglobin 17.1 (*)    All other components within normal limits  RESP PANEL BY RT-PCR (RSV, FLU A&B, COVID)  RVPGX2  LIPASE, BLOOD  URINALYSIS, ROUTINE W REFLEX MICROSCOPIC    EKG None  Radiology No results found.  Procedures Procedures    Medications Ordered in ED Medications  ondansetron (ZOFRAN-ODT) disintegrating tablet 4 mg (4 mg Oral Given 11/01/22 1140)  sodium chloride 0.9 % bolus 1,000 mL (0 mLs Intravenous Stopped 11/01/22 1653)  ondansetron (ZOFRAN) injection 4 mg (4 mg Intravenous Given 11/01/22 1519)  metoCLOPramide (REGLAN) injection 10 mg (10 mg Intravenous Given 11/01/22 1638)  diphenhydrAMINE (BENADRYL) injection 12.5 mg (12.5 mg Intravenous Given 11/01/22 1640)  loperamide (IMODIUM) capsule 4 mg (4 mg Oral Given 11/01/22 1642)  sodium chloride 0.9 % bolus 1,000 mL (1,000 mLs Intravenous New Bag/Given 11/01/22 1643)    ED Course/ Medical Decision Making/ A&P                           Medical Decision Making Patient presenting with a 1 day history of nausea, vomiting and diarrhea.  Another family member has similar symptoms.  Although he does have a history of alcohol abuse, I do not think this is related to that, I favor this being a viral gastroenteritis, especially with the diarrhea and another family member also not feeling well.  He was given IV fluids, he was also given Zofran earlier in his visit, this improved his nausea but then returned, he was then given a dose of Reglan.  He was able to tolerate p.o. intake prior to discharge and was feeling better after receiving IV fluids.  He was given strict return precautions regarding worsening symptoms.  He has no abdominal exam findings to suggest an acute abdominal  process.  Amount and/or Complexity of Data Reviewed Labs: ordered.    Details: Respiratory panel is negative, c-Met with a minimal elevation in his AST at 46, his lipase is normal, his 1 WBC count is normal at 9.1.  He has an elevated hemoglobin at 17.1, probably at least partially secondary to dehydration, however he is also a heavy smoker.  Risk Prescription drug management. Decision regarding hospitalization. Risk Details: No indication for hospitalization at this time.           Final Clinical Impression(s) / ED Diagnoses Final diagnoses:  Viral gastroenteritis    Rx /  DC Orders ED Discharge Orders          Ordered    metoCLOPramide (REGLAN) 10 MG tablet  Every 8 hours PRN        11/01/22 1840    loperamide (IMODIUM) 2 MG capsule  4 times daily PRN        11/01/22 1840              Evalee Jefferson, Hershal Coria 11/01/22 1847    Elgie Congo, MD 11/02/22 [phone]

## 2022-11-01 NOTE — Discharge Instructions (Signed)
Make sure you continue to drink plenty of fluids to avoid dehydration.  Use the medications prescribed if needed for continued symptoms.  As discussed you may not need any more Imodium, the dose you received here may completely improve the symptom.  Get rechecked for any worsened or persistent symptoms lasting beyond the next 48 hours.

## 2022-11-01 NOTE — ED Notes (Signed)
Pt has vomited several times since triage. Rounded corner at V1 and pt had finger down his throat. Advised pt not to do that anymore. Pt vomiting clear liquid. Pt was advised at triage to eat or drink nothing.

## 2022-11-01 NOTE — ED Triage Notes (Signed)
Pt c/o n/v/d since early this am. Vomited x 4 and diarrhea x 3. Pt slightly jittery. A/o. Mm moist. C/o intermittent abd pain to mid area.

## 2022-12-29 ENCOUNTER — Emergency Department (HOSPITAL_COMMUNITY)
Admission: EM | Admit: 2022-12-29 | Discharge: 2022-12-29 | Disposition: A | Discharge status: Home / Self Care | Payer: Medicaid Other | Attending: Emergency Medicine | Admitting: Emergency Medicine

## 2022-12-29 ENCOUNTER — Encounter (HOSPITAL_COMMUNITY): Payer: Self-pay | Admitting: *Deleted

## 2022-12-29 ENCOUNTER — Other Ambulatory Visit: Payer: Self-pay

## 2022-12-29 DIAGNOSIS — R112 Nausea with vomiting, unspecified: Secondary | ICD-10-CM | POA: Diagnosis not present

## 2022-12-29 DIAGNOSIS — F17200 Nicotine dependence, unspecified, uncomplicated: Secondary | ICD-10-CM | POA: Insufficient documentation

## 2022-12-29 LAB — COMPREHENSIVE METABOLIC PANEL
ALT: 97 U/L — ABNORMAL HIGH (ref 0–44)
AST: 103 U/L — ABNORMAL HIGH (ref 15–41)
Albumin: 5.7 g/dL — ABNORMAL HIGH (ref 3.5–5.0)
Alkaline Phosphatase: 96 U/L (ref 38–126)
Anion gap: 20 — ABNORMAL HIGH (ref 5–15)
BUN: 9 mg/dL (ref 6–20)
CO2: 23 mmol/L (ref 22–32)
Calcium: 10.2 mg/dL (ref 8.9–10.3)
Chloride: 96 mmol/L — ABNORMAL LOW (ref 98–111)
Creatinine, Ser: 0.85 mg/dL (ref 0.61–1.24)
GFR, Estimated: >60 mL/min (ref >60)
Glucose, Bld: 82 mg/dL (ref 70–99)
Potassium: 3.7 mmol/L (ref 3.5–5.1)
Sodium: 139 mmol/L (ref 135–145)
Total Bilirubin: 2.1 mg/dL — ABNORMAL HIGH (ref 0.3–1.2)
Total Protein: 9.2 g/dL — ABNORMAL HIGH (ref 6.5–8.1)

## 2022-12-29 LAB — CBC
HCT: 49.6 % (ref 39.0–52.0)
Hemoglobin: 16.7 g/dL (ref 13.0–17.0)
MCH: 31.7 pg (ref 26.0–34.0)
MCHC: 33.7 g/dL (ref 30.0–36.0)
MCV: 94.3 fL (ref 80.0–100.0)
Platelets: 332 10*3/uL (ref 150–400)
RBC: 5.26 MIL/uL (ref 4.22–5.81)
RDW: 13.5 % (ref 11.5–15.5)
WBC: 14.9 10*3/uL — ABNORMAL HIGH (ref 4.0–10.5)
nRBC: 0 % (ref 0.0–0.2)

## 2022-12-29 LAB — MAGNESIUM: Magnesium: 2.1 mg/dL (ref 1.7–2.4)

## 2022-12-29 LAB — LIPASE, BLOOD: Lipase: 25 U/L (ref 11–51)

## 2022-12-29 MED ORDER — DIPHENHYDRAMINE HCL 50 MG/ML IJ SOLN
50.0000 mg | Freq: Once | INTRAMUSCULAR | Status: AC
  Administered 2022-12-29: 50 mg via INTRAVENOUS
  Filled 2022-12-29: qty 1

## 2022-12-29 MED ORDER — LACTATED RINGERS IV BOLUS
1000.0000 mL | Freq: Once | INTRAVENOUS | Status: AC
  Administered 2022-12-29: 1000 mL via INTRAVENOUS

## 2022-12-29 MED ORDER — HALOPERIDOL LACTATE 5 MG/ML IJ SOLN
5.0000 mg | Freq: Once | INTRAMUSCULAR | Status: AC
  Administered 2022-12-29: 5 mg via INTRAVENOUS
  Filled 2022-12-29: qty 1

## 2022-12-29 MED ORDER — PANTOPRAZOLE SODIUM 40 MG IV SOLR
40.0000 mg | Freq: Once | INTRAVENOUS | Status: AC
  Administered 2022-12-29: 40 mg via INTRAVENOUS
  Filled 2022-12-29: qty 10

## 2022-12-29 NOTE — ED Notes (Signed)
Urinal provided, encouraged to provided specimen per order

## 2022-12-29 NOTE — ED Triage Notes (Signed)
Pt with emesis x 3 hours.  Denies any recent sick contacts. Pt with multiple visits for same.

## 2022-12-29 NOTE — Discharge Instructions (Signed)
Thank you for allow me to be part of your care today.  You were treated in the ER for vomiting and were given fluids, antiemetic, and Benadryl.  Continue to use your ODT Zofran at home as needed.  I highly recommend limiting your use of alcohol and marijuana as this can contribute to these vomiting episodes.  Return to the ER if you develop worsening of your symptoms, are unable to tolerate liquids despite medication, or have any new concerns.

## 2022-12-29 NOTE — ED Notes (Signed)
Pt d/c home with visitor per MD order. Discharge summary reviewed with pt, pt verbalizes understanding. Ambulatory off unit . No s/s of acute distress noted at discharge

## 2022-12-29 NOTE — ED Provider Notes (Signed)
[facility] Provider Note   CSN: [id number] Arrival date & time: 12/29/22  1427     History  Chief Complaint  Patient presents with   Emesis    Timothy [name] is a 27 y.o. male with history significant for alcohol abuse, cannabinoid hyperemesis syndrome, tobacco use, alcoholic ketoacidosis presents to the ED complaining of vomiting since 1000 today.  Patient is well-known to ED with multiple visits for similar complaint.  Patient admits to daily alcohol and marijuana use.  Denies fever, abdominal pain, diarrhea, constipation, lightheadedness, dizziness, syncope.  He also denies known sick contacts.       Home Medications Prior to Admission medications   Medication Sig Start Date End Date Taking? Authorizing Provider  chlordiazePOXIDE (LIBRIUM) 25 MG capsule 49m PO TID x 1D, then 25-515mPO BID X 1D, then 25-5072mO QD X 1D Patient not taking: Reported on 09/20/2022 09/19/22   Blue, Soijett A, PA-C  loperamide (IMODIUM) 2 MG capsule Take 1 capsule (2 mg total) by mouth 4 (four) times daily as needed for diarrhea or loose stools. 11/01/22   IdoEvalee JeffersonA-C  metoCLOPramide (REGLAN) 10 MG tablet Take 1 tablet (10 mg total) by mouth every 8 (eight) hours as needed for nausea or vomiting. 11/01/22   Idol, JulAlmyra FreeA-C  omeprazole (PRILOSEC) 20 MG capsule Take 1 capsule (20 mg total) by mouth daily. Patient not taking: Reported on 09/19/2022 08/01/22   Triplett, Tammy, PA-C  ondansetron (ZOFRAN) 4 MG tablet Take 1 tablet (4 mg total) by mouth every 6 (six) hours as needed for nausea or vomiting. As needed for nausea vomiting 09/19/22   Blue, Soijett A, PA-C      Allergies    Amoxil [amoxicillin]    Review of Systems   Review of Systems  Constitutional:  Negative for fever.  Gastrointestinal:  Positive for nausea and vomiting. Negative for abdominal pain, constipation and diarrhea.  Neurological:  Negative for dizziness, syncope and  light-headedness.    Physical Exam Updated Vital Signs BP 132/79   Pulse 89   Temp 98 F (36.7 C) (Oral)   Resp 18   SpO2 100%  Physical Exam Vitals and nursing note reviewed.  Constitutional:      General: He is not in acute distress.    Appearance: He is not ill-appearing, toxic-appearing or diaphoretic.  HENT:     Mouth/Throat:     Mouth: Mucous membranes are moist.     Pharynx: Oropharynx is clear.  Cardiovascular:     Rate and Rhythm: Normal rate and regular rhythm.     Pulses: Normal pulses.     Heart sounds: Normal heart sounds.  Pulmonary:     Effort: Pulmonary effort is normal. No respiratory distress.     Breath sounds: Normal breath sounds and air entry.  Abdominal:     General: Abdomen is flat. Bowel sounds are normal. There is no distension.     Palpations: Abdomen is soft.     Tenderness: There is no abdominal tenderness.     Comments: He has no tenderness to light and deep palpation over the entire abdomen.  It is not distended.  He was actively vomiting/spitting up mucus upon entry to the room.  Skin:    General: Skin is warm and dry.     Capillary Refill: Capillary refill takes less than 2 seconds.  Neurological:     Mental Status: He is alert. Mental status is at baseline.  Psychiatric:  Mood and Affect: Mood normal.        Behavior: Behavior normal.     ED Results / Procedures / Treatments   Labs (all labs ordered are listed, but only abnormal results are displayed) Labs Reviewed  COMPREHENSIVE METABOLIC PANEL - Abnormal; Notable for the following components:      Result Value   Chloride 96 (*)    Total Protein 9.2 (*)    Albumin 5.7 (*)    AST 103 (*)    ALT 97 (*)    Total Bilirubin 2.1 (*)    Anion gap 20 (*)    All other components within normal limits  CBC - Abnormal; Notable for the following components:   WBC 14.9 (*)    All other components within normal limits  LIPASE, BLOOD  MAGNESIUM  URINALYSIS, ROUTINE W REFLEX  MICROSCOPIC    EKG None  Radiology No results found.  Procedures Procedures    Medications Ordered in ED Medications  lactated ringers bolus 1,000 mL (0 mLs Intravenous Stopped 12/29/22 1925)  haloperidol lactate (HALDOL) injection 5 mg (5 mg Intravenous Given 12/29/22 1721)  diphenhydrAMINE (BENADRYL) injection 50 mg (50 mg Intravenous Given 12/29/22 1723)  pantoprazole (PROTONIX) injection 40 mg (40 mg Intravenous Given 12/29/22 1718)    ED Course/ Medical Decision Making/ A&P                             Medical Decision Making Amount and/or Complexity of Data Reviewed Labs: ordered.  Risk Prescription drug management.   This patient presents to the ED with chief complaint(s) of emesis for the past few hours with pertinent past medical history of daily alcohol and marijuana use, alcoholic ketoacidosis, cannabinoid hyperemesis syndrome.  The complaint involves an extensive differential diagnosis and also carries with it a high risk of complications and morbidity.    The differential diagnosis includes cannabinoid hyperemesis syndrome, alcoholic ketoacidosis, emesis secondary to alcohol use, gastritis, gastroenteritis, pancreatitis, hepatitis  The initial plan is to obtain baseline labs and UA; treat with fluids, antiemetic and PPI  Additional history obtained: Additional history obtained from  none Records reviewed  previous ED documents  Initial Assessment:   Upon entering the room, patient is actively vomiting into the trash can.  He does not appear to be in acute distress and is nontoxic in appearance.  Skin is warm and dry.  Abdomen is unremarkable, and is not tender to light or deep palpation throughout.  Bowel sounds are normal.  Heart rate is normal in the 90s with a regular rhythm.  Lungs are clear to auscultation bilaterally.  Patient is requesting fluids, antiemetics, and Benadryl.  Independent ECG/labs interpretation:  The following labs were independently  interpreted:  CBC with leukocytosis, no evidence of anemia.  Metabolic panel with elevated AST, ALT, normal alk phos.  He does have a elevated anion gap likely secondary to his ethanol use.  Patient admitted to ingesting significant amount of alcohol yesterday.  Independent visualization and interpretation of imaging: I independently visualized the following imaging with scope of interpretation limited to determining acute life threatening conditions related to emergency care:  CT abdomen and pelvis was considered, but given that patient has no abdominal pain, do not feel it is required at this time.   Treatment and Reassessment: Patient was treated with IV fluids, Protonix, Haldol, and Benadryl given his history of alcohol and marijuana use.  Suspect his emesis is secondary to his alcohol  use, patient reports he last drank yesterday.  He does use marijuana daily.  Will attempt a PO challenge following treatment to ensure he is able to tolerate at home.  Patient was able to tolerate crackers and water without any difficulty.  Patient states he is feeling much better and is ready to go home.  Discussed with patient antiemetic use at home, he states that he still has enough ODT Zofran.  Disposition:   The patient has been appropriately medically screened and/or stabilized in the ED. I have low suspicion for any other emergent medical condition which would require further screening, evaluation or treatment in the ED or require inpatient management. At time of discharge the patient is hemodynamically stable and in no acute distress. I have discussed work-up results and diagnosis with patient and answered all questions. Patient is agreeable with discharge plan. We discussed strict return precautions for returning to the emergency department and they verbalized understanding.            Final Clinical Impression(s) / ED Diagnoses Final diagnoses:  Nausea and vomiting, unspecified vomiting type     Rx / DC Orders ED Discharge Orders     None         Pat Kocher, [place] 12/29/22 1939    Noemi Chapel, MD 12/31/22 1836

## 2023-02-22 ENCOUNTER — Emergency Department (HOSPITAL_COMMUNITY)
Admission: EM | Admit: 2023-02-22 | Discharge: 2023-02-22 | Disposition: A | Discharge status: Home / Self Care | Payer: Medicaid Other | Attending: Emergency Medicine | Admitting: Emergency Medicine

## 2023-02-22 DIAGNOSIS — Y900 Blood alcohol level of less than 20 mg/100 ml: Secondary | ICD-10-CM | POA: Diagnosis not present

## 2023-02-22 DIAGNOSIS — G43A0 Cyclical vomiting, not intractable: Secondary | ICD-10-CM | POA: Insufficient documentation

## 2023-02-22 DIAGNOSIS — F1210 Cannabis abuse, uncomplicated: Secondary | ICD-10-CM | POA: Diagnosis not present

## 2023-02-22 DIAGNOSIS — R109 Unspecified abdominal pain: Secondary | ICD-10-CM | POA: Insufficient documentation

## 2023-02-22 DIAGNOSIS — R1110 Vomiting, unspecified: Secondary | ICD-10-CM | POA: Diagnosis present

## 2023-02-22 DIAGNOSIS — F17200 Nicotine dependence, unspecified, uncomplicated: Secondary | ICD-10-CM | POA: Insufficient documentation

## 2023-02-22 DIAGNOSIS — R1115 Cyclical vomiting syndrome unrelated to migraine: Secondary | ICD-10-CM

## 2023-02-22 LAB — RAPID URINE DRUG SCREEN, HOSP PERFORMED
Amphetamines: NOT DETECTED
Barbiturates: NOT DETECTED
Benzodiazepines: NOT DETECTED
Cocaine: NOT DETECTED
Opiates: NOT DETECTED
Tetrahydrocannabinol: POSITIVE — AB

## 2023-02-22 LAB — COMPREHENSIVE METABOLIC PANEL
ALT: 57 U/L — ABNORMAL HIGH (ref 0–44)
AST: 60 U/L — ABNORMAL HIGH (ref 15–41)
Albumin: 5.4 g/dL — ABNORMAL HIGH (ref 3.5–5.0)
Alkaline Phosphatase: 92 U/L (ref 38–126)
Anion gap: 16 — ABNORMAL HIGH (ref 5–15)
BUN: 11 mg/dL (ref 6–20)
CO2: 25 mmol/L (ref 22–32)
Calcium: 10 mg/dL (ref 8.9–10.3)
Chloride: 97 mmol/L — ABNORMAL LOW (ref 98–111)
Creatinine, Ser: 1.01 mg/dL (ref 0.61–1.24)
GFR, Estimated: >60 mL/min (ref >60)
Glucose, Bld: 88 mg/dL (ref 70–99)
Potassium: 3.5 mmol/L (ref 3.5–5.1)
Sodium: 138 mmol/L (ref 135–145)
Total Bilirubin: 2.6 mg/dL — ABNORMAL HIGH (ref 0.3–1.2)
Total Protein: 8.8 g/dL — ABNORMAL HIGH (ref 6.5–8.1)

## 2023-02-22 LAB — CBC WITH DIFFERENTIAL/PLATELET
Abs Immature Granulocytes: 0.06 10*3/uL (ref 0.00–0.07)
Basophils Absolute: 0.1 10*3/uL (ref 0.0–0.1)
Basophils Relative: 1 %
Eosinophils Absolute: 0 10*3/uL (ref 0.0–0.5)
Eosinophils Relative: 0 %
HCT: 50.5 % (ref 39.0–52.0)
Hemoglobin: 17.2 g/dL — ABNORMAL HIGH (ref 13.0–17.0)
Immature Granulocytes: 1 %
Lymphocytes Relative: 10 %
Lymphs Abs: 1.2 10*3/uL (ref 0.7–4.0)
MCH: 32.4 pg (ref 26.0–34.0)
MCHC: 34.1 g/dL (ref 30.0–36.0)
MCV: 95.1 fL (ref 80.0–100.0)
Monocytes Absolute: 0.8 10*3/uL (ref 0.1–1.0)
Monocytes Relative: 7 %
Neutro Abs: 9.7 10*3/uL — ABNORMAL HIGH (ref 1.7–7.7)
Neutrophils Relative %: 81 %
Platelets: 300 10*3/uL (ref 150–400)
RBC: 5.31 MIL/uL (ref 4.22–5.81)
RDW: 13.3 % (ref 11.5–15.5)
WBC: 11.9 10*3/uL — ABNORMAL HIGH (ref 4.0–10.5)
nRBC: 0 % (ref 0.0–0.2)

## 2023-02-22 LAB — LIPASE, BLOOD: Lipase: 47 U/L (ref 11–51)

## 2023-02-22 LAB — ETHANOL: Alcohol, Ethyl (B): <10 mg/dL (ref <10)

## 2023-02-22 MED ORDER — SODIUM CHLORIDE 0.9 % IV BOLUS
1000.0000 mL | Freq: Once | INTRAVENOUS | Status: AC
  Administered 2023-02-22: 1000 mL via INTRAVENOUS

## 2023-02-22 MED ORDER — HALOPERIDOL LACTATE 5 MG/ML IJ SOLN
5.0000 mg | Freq: Once | INTRAMUSCULAR | Status: AC
  Administered 2023-02-22: 5 mg via INTRAVENOUS
  Filled 2023-02-22: qty 1

## 2023-02-22 MED ORDER — METOCLOPRAMIDE HCL 5 MG/ML IJ SOLN
10.0000 mg | Freq: Once | INTRAMUSCULAR | Status: AC
  Administered 2023-02-22: 10 mg via INTRAVENOUS
  Filled 2023-02-22: qty 2

## 2023-02-22 MED ORDER — DIPHENHYDRAMINE HCL 50 MG/ML IJ SOLN
25.0000 mg | Freq: Once | INTRAMUSCULAR | Status: AC
  Administered 2023-02-22: 25 mg via INTRAVENOUS
  Filled 2023-02-22: qty 1

## 2023-02-22 MED ORDER — PANTOPRAZOLE SODIUM 40 MG IV SOLR
40.0000 mg | Freq: Once | INTRAVENOUS | Status: AC
  Administered 2023-02-22: 40 mg via INTRAVENOUS
  Filled 2023-02-22: qty 10

## 2023-02-22 MED ORDER — SODIUM CHLORIDE 0.9 % IV SOLN: INTRAVENOUS | Status: DC

## 2023-02-22 NOTE — ED Provider Notes (Signed)
[facility] Provider Note   CSN: [id number] Arrival date & time: 02/22/23  0725     History  Chief Complaint  Patient presents with   Emesis    Timothy [name] is a 27 y.o. male.  Patient with onset of nausea vomiting at about 5 in the morning.  He vomited about 10 times since that no blood.  Patient did have alcohol yesterday.  Patient has been seen several times for cyclical vomiting or nausea vomiting.  Some suspicion that it could be cannabis related.  Will check urine drug screen.  Past medical history significant for cannabinoid hyperemesis syndrome patient is a heavy smoker a pack a day.  Patient has a history of alcohol abuse in the past.  Known to have some alcohol hepatitis.  Patient denies any vomiting of blood.  No diarrhea has some abdominal discomfort but no severe abdominal pain.       Home Medications Prior to Admission medications   Medication Sig Start Date End Date Taking? Authorizing Provider  chlordiazePOXIDE (LIBRIUM) 25 MG capsule 50mg  PO TID x 1D, then 25-50mg  PO BID X 1D, then 25-50mg  PO QD X 1D Patient not taking: Reported on 09/20/2022 09/19/22   Blue, Soijett A, PA-C  loperamide (IMODIUM) 2 MG capsule Take 1 capsule (2 mg total) by mouth 4 (four) times daily as needed for diarrhea or loose stools. 11/01/22   Evalee Jefferson, PA-C  metoCLOPramide (REGLAN) 10 MG tablet Take 1 tablet (10 mg total) by mouth every 8 (eight) hours as needed for nausea or vomiting. 11/01/22   Idol, Almyra Free, PA-C  omeprazole (PRILOSEC) 20 MG capsule Take 1 capsule (20 mg total) by mouth daily. Patient not taking: Reported on 09/19/2022 08/01/22   Triplett, Tammy, PA-C  ondansetron (ZOFRAN) 4 MG tablet Take 1 tablet (4 mg total) by mouth every 6 (six) hours as needed for nausea or vomiting. As needed for nausea vomiting 09/19/22   Blue, Soijett A, PA-C      Allergies    Amoxil [amoxicillin]    Review of Systems   Review of Systems   Constitutional:  Negative for chills and fever.  HENT:  Negative for ear pain and sore throat.   Eyes:  Negative for pain and visual disturbance.  Respiratory:  Negative for cough and shortness of breath.   Cardiovascular:  Negative for chest pain and palpitations.  Gastrointestinal:  Positive for nausea and vomiting. Negative for abdominal pain.  Genitourinary:  Negative for dysuria and hematuria.  Musculoskeletal:  Negative for arthralgias and back pain.  Skin:  Negative for color change and rash.  Neurological:  Negative for seizures and syncope.  All other systems reviewed and are negative.   Physical Exam Updated Vital Signs BP (!) 158/110   Pulse 86   Temp 97.8 F (36.6 C) (Oral)   Ht 1.626 m (5\' 4" )   Wt 72.6 kg   SpO2 100%   BMI 27.46 kg/m  Physical Exam Vitals and nursing note reviewed.  Constitutional:      General: He is not in acute distress.    Appearance: Normal appearance. He is well-developed.  HENT:     Head: Normocephalic and atraumatic.     Mouth/Throat:     Mouth: Mucous membranes are dry.  Eyes:     Extraocular Movements: Extraocular movements intact.     Conjunctiva/sclera: Conjunctivae normal.     Pupils: Pupils are equal, round, and reactive to light.  Cardiovascular:  Rate and Rhythm: Normal rate and regular rhythm.     Heart sounds: No murmur heard. Pulmonary:     Effort: Pulmonary effort is normal. No respiratory distress.     Breath sounds: Normal breath sounds.  Abdominal:     Palpations: Abdomen is soft.     Tenderness: There is no abdominal tenderness.  Musculoskeletal:        General: No swelling.     Cervical back: Neck supple.  Skin:    General: Skin is warm and dry.     Capillary Refill: Capillary refill takes less than 2 seconds.  Neurological:     General: No focal deficit present.     Mental Status: He is alert and oriented to person, place, and time.  Psychiatric:        Mood and Affect: Mood normal.     ED  Results / Procedures / Treatments   Labs (all labs ordered are listed, but only abnormal results are displayed) Labs Reviewed  CBC WITH DIFFERENTIAL/PLATELET - Abnormal; Notable for the following components:      Result Value   WBC 11.9 (*)    Hemoglobin 17.2 (*)    Neutro Abs 9.7 (*)    All other components within normal limits  COMPREHENSIVE METABOLIC PANEL - Abnormal; Notable for the following components:   Chloride 97 (*)    Total Protein 8.8 (*)    Albumin 5.4 (*)    AST 60 (*)    ALT 57 (*)    Total Bilirubin 2.6 (*)    Anion gap 16 (*)    All other components within normal limits  RAPID URINE DRUG SCREEN, HOSP PERFORMED - Abnormal; Notable for the following components:   Tetrahydrocannabinol POSITIVE (*)    All other components within normal limits  LIPASE, BLOOD  ETHANOL    EKG None  Radiology No results found.  Procedures Procedures    Medications Ordered in ED Medications  0.9 %  sodium chloride infusion (0 mLs Intravenous Stopped 02/22/23 1519)  sodium chloride 0.9 % bolus 1,000 mL (1,000 mLs Intravenous Bolus 02/22/23 0840)  pantoprazole (PROTONIX) injection 40 mg (40 mg Intravenous Given 02/22/23 0841)  metoCLOPramide (REGLAN) injection 10 mg (10 mg Intravenous Given 02/22/23 0841)  haloperidol lactate (HALDOL) injection 5 mg (5 mg Intravenous Given 02/22/23 1156)  diphenhydrAMINE (BENADRYL) injection 25 mg (25 mg Intravenous Given 02/22/23 1223)    ED Course/ Medical Decision Making/ A&P                             Medical Decision Making Amount and/or Complexity of Data Reviewed Labs: ordered.  Risk Prescription drug management.   Patient's electrolytes here are very normal.  Urine drug screen positive for THC.  Patient seen in the past for similar findings thought may be alcohol related or THC related.  Suspect this THC related.  Talk to patient about this.  Patient received IV fluids.  Received IV Reglan without significant help.  Then he received  Haldol and Benadryl and patient feels much much better.  Vomiting resolved.  CBC white count 11.9 hemoglobin 17.2 platelets 300.  Lipase was normal.  Complete metabolic panel total bili of 2.6 but alk phos normal renal function normal.  Anion gap 16.  Patient nontoxic no acute distress feeling much better.  Patient stable for discharge home.   Final Clinical Impression(s) / ED Diagnoses Final diagnoses:  Cyclical vomiting    Rx /  DC Orders ED Discharge Orders     None         Fredia Sorrow, MD 02/22/23 [phone]

## 2023-02-22 NOTE — Discharge Instructions (Signed)
Take the Zofran you have at home.  You received significant mount of fluids here your electrolytes all normal.  Would recommend avoiding cannabis and avoiding alcohol.  Return for any new or worse symptoms.

## 2023-02-22 NOTE — ED Triage Notes (Signed)
Pt c/o N/V since lastnight, when asked pt if he has diarrhea he states "I feel like I got to". Denies body aches, or fevers

## 2023-02-22 NOTE — ED Notes (Signed)
Pt given urinal and informed of need for urine specimen  

## 2023-04-04 ENCOUNTER — Encounter (HOSPITAL_COMMUNITY): Payer: Self-pay | Admitting: *Deleted

## 2023-04-04 ENCOUNTER — Emergency Department (HOSPITAL_COMMUNITY)
Admission: EM | Admit: 2023-04-04 | Discharge: 2023-04-04 | Disposition: A | Discharge status: Home / Self Care | Payer: Medicaid Other | Attending: Emergency Medicine | Admitting: Emergency Medicine

## 2023-04-04 ENCOUNTER — Other Ambulatory Visit: Payer: Self-pay

## 2023-04-04 DIAGNOSIS — D72829 Elevated white blood cell count, unspecified: Secondary | ICD-10-CM | POA: Diagnosis not present

## 2023-04-04 DIAGNOSIS — R112 Nausea with vomiting, unspecified: Secondary | ICD-10-CM | POA: Diagnosis not present

## 2023-04-04 LAB — CBC
HCT: 50.5 % (ref 39.0–52.0)
Hemoglobin: 16.9 g/dL (ref 13.0–17.0)
MCH: 32.2 pg (ref 26.0–34.0)
MCHC: 33.5 g/dL (ref 30.0–36.0)
MCV: 96.2 fL (ref 80.0–100.0)
Platelets: 294 10*3/uL (ref 150–400)
RBC: 5.25 MIL/uL (ref 4.22–5.81)
RDW: 13.5 % (ref 11.5–15.5)
WBC: 14.9 10*3/uL — ABNORMAL HIGH (ref 4.0–10.5)
nRBC: 0 % (ref 0.0–0.2)

## 2023-04-04 LAB — COMPREHENSIVE METABOLIC PANEL
ALT: 75 U/L — ABNORMAL HIGH (ref 0–44)
AST: 65 U/L — ABNORMAL HIGH (ref 15–41)
Albumin: 5.4 g/dL — ABNORMAL HIGH (ref 3.5–5.0)
Alkaline Phosphatase: 91 U/L (ref 38–126)
Anion gap: 20 — ABNORMAL HIGH (ref 5–15)
BUN: 10 mg/dL (ref 6–20)
CO2: 19 mmol/L — ABNORMAL LOW (ref 22–32)
Calcium: 10 mg/dL (ref 8.9–10.3)
Chloride: 101 mmol/L (ref 98–111)
Creatinine, Ser: 0.84 mg/dL (ref 0.61–1.24)
GFR, Estimated: >60 mL/min (ref >60)
Glucose, Bld: 86 mg/dL (ref 70–99)
Potassium: 3.8 mmol/L (ref 3.5–5.1)
Sodium: 140 mmol/L (ref 135–145)
Total Bilirubin: 1.5 mg/dL — ABNORMAL HIGH (ref 0.3–1.2)
Total Protein: 8.7 g/dL — ABNORMAL HIGH (ref 6.5–8.1)

## 2023-04-04 LAB — LIPASE, BLOOD: Lipase: 28 U/L (ref 11–51)

## 2023-04-04 MED ORDER — ONDANSETRON 4 MG PO TBDP: 4.0000 mg | ORAL_TABLET | Freq: Three times a day (TID) | ORAL | 0 refills | Status: DC | PRN

## 2023-04-04 MED ORDER — LACTATED RINGERS IV BOLUS
1000.0000 mL | Freq: Once | INTRAVENOUS | Status: AC
  Administered 2023-04-04: 1000 mL via INTRAVENOUS

## 2023-04-04 MED ORDER — PANTOPRAZOLE SODIUM 40 MG IV SOLR
40.0000 mg | Freq: Once | INTRAVENOUS | Status: AC
  Administered 2023-04-04: 40 mg via INTRAVENOUS
  Filled 2023-04-04: qty 10

## 2023-04-04 MED ORDER — DIPHENHYDRAMINE HCL 50 MG/ML IJ SOLN
50.0000 mg | Freq: Once | INTRAMUSCULAR | Status: AC
  Administered 2023-04-04: 50 mg via INTRAVENOUS
  Filled 2023-04-04: qty 1

## 2023-04-04 MED ORDER — HALOPERIDOL LACTATE 5 MG/ML IJ SOLN
5.0000 mg | Freq: Once | INTRAMUSCULAR | Status: AC
  Administered 2023-04-04: 5 mg via INTRAVENOUS
  Filled 2023-04-04: qty 1

## 2023-04-04 NOTE — ED Triage Notes (Signed)
Pt with recurrent emesis that started at 10 this morning.  Denies any pain.  Not able to keep anything down. Pt admit to drinking ETOH daily and marijuana use.

## 2023-04-04 NOTE — ED Provider Notes (Signed)
[facility] EM[facility]PARTMENT AT [facility] Provider Note   CSN: [id number] Arrival date & time: 04/04/23  1356     History Chief Complaint  Patient presents with   Emesis    Timothy [name] is a 27 y.o. male.  Patient with past history of alcoholic ketoacidosis, alcohol abuse, and cannabinoid hyperemesis syndrome presents to the emergency department with complaints of vomiting.  Patient is well-known to the emergency department with similar complaints in the past.  Reports that vomiting began around 10 AM this morning.  Denies any abdominal pain with this.  Unable to keep anything down.  Did report to drinking alcohol daily as well as marijuana use daily.  Denies any hematemesis.   Emesis      Home Medications Prior to Admission medications   Medication Sig Start Date End Date Taking? Authorizing Provider  ondansetron (ZOFRAN-ODT) 4 MG disintegrating tablet Take 1 tablet (4 mg total) by mouth every 8 (eight) hours as needed for nausea or vomiting. 04/04/23  Yes Khrystal Jeanmarie A, PA-C  chlordiazePOXIDE (LIBRIUM) 25 MG capsule 50mg  PO TID x 1D, then 25-50mg  PO BID X 1D, then 25-50mg  PO QD X 1D Patient not taking: Reported on 09/20/2022 09/19/22   Blue, Soijett A, PA-C  loperamide (IMODIUM) 2 MG capsule Take 1 capsule (2 mg total) by mouth 4 (four) times daily as needed for diarrhea or loose stools. 11/01/22   Burgess Amor, PA-C  metoCLOPramide (REGLAN) 10 MG tablet Take 1 tablet (10 mg total) by mouth every 8 (eight) hours as needed for nausea or vomiting. 11/01/22   Idol, Raynelle Fanning, PA-C  omeprazole (PRILOSEC) 20 MG capsule Take 1 capsule (20 mg total) by mouth daily. Patient not taking: Reported on 09/19/2022 08/01/22   Triplett, Tammy, PA-C  ondansetron (ZOFRAN) 4 MG tablet Take 1 tablet (4 mg total) by mouth every 6 (six) hours as needed for nausea or vomiting. As needed for nausea vomiting 09/19/22   Blue, Soijett A, PA-C      Allergies    Amoxil [amoxicillin]    Review of  Systems   Review of Systems  Gastrointestinal:  Positive for vomiting.  All other systems reviewed and are negative.   Physical Exam Updated Vital Signs BP 134/77 (BP Location: Right Arm)   Pulse 98   Temp 98.5 F (36.9 C) (Oral)   Resp 14   Ht 5\' 4"  (1.626 m)   Wt 72.6 kg   SpO2 100%   BMI 27.46 kg/m  Physical Exam Vitals and nursing note reviewed.  Constitutional:      General: He is not in acute distress.    Appearance: He is well-developed.  HENT:     Head: Normocephalic and atraumatic.  Eyes:     Conjunctiva/sclera: Conjunctivae normal.  Cardiovascular:     Rate and Rhythm: Normal rate and regular rhythm.     Heart sounds: No murmur heard. Pulmonary:     Effort: Pulmonary effort is normal. No respiratory distress.     Breath sounds: Normal breath sounds.  Abdominal:     General: Abdomen is flat.     Palpations: Abdomen is soft.     Tenderness: There is no abdominal tenderness. There is no guarding.  Musculoskeletal:        General: No swelling.     Cervical back: Neck supple.  Skin:    General: Skin is warm and dry.     Capillary Refill: Capillary refill takes less than 2 seconds.  Neurological:  Mental Status: He is alert.  Psychiatric:        Mood and Affect: Mood normal.     ED Results / Procedures / Treatments   Labs (all labs ordered are listed, but only abnormal results are displayed) Labs Reviewed  COMPREHENSIVE METABOLIC PANEL - Abnormal; Notable for the following components:      Result Value   CO2 19 (*)    Total Protein 8.7 (*)    Albumin 5.4 (*)    AST 65 (*)    ALT 75 (*)    Total Bilirubin 1.5 (*)    Anion gap 20 (*)    All other components within normal limits  CBC - Abnormal; Notable for the following components:   WBC 14.9 (*)    All other components within normal limits  LIPASE, BLOOD  URINALYSIS, ROUTINE W REFLEX MICROSCOPIC    EKG None  Radiology No results found.  Procedures Procedures   Medications Ordered  in ED Medications  lactated ringers bolus 1,000 mL (0 mLs Intravenous Stopped 04/04/23 1845)  pantoprazole (PROTONIX) injection 40 mg (40 mg Intravenous Given 04/04/23 1601)  haloperidol lactate (HALDOL) injection 5 mg (5 mg Intravenous Given 04/04/23 1602)  diphenhydrAMINE (BENADRYL) injection 50 mg (50 mg Intravenous Given 04/04/23 1602)    ED Course/ Medical Decision Making/ A&P                           Medical Decision Making Amount and/or Complexity of Data Reviewed Labs: ordered.  Risk Prescription drug management.   This patient presents to the ED for concern of emesis.  Differential diagnosis includes alcohol intoxication, gastroenteritis, bowel obstruction, appendicitis, cannabinoid hyperemesis syndrome   Lab Tests:  I Ordered, and personally interpreted labs.  The pertinent results include: Leukocytosis, elevated anion gap which appears to be consistent with patient's baseline but compared to previous labs   Medicines ordered and prescription drug management:  I ordered medication including Protonix, Haldol, Benadryl, fluids for nausea and vomiting Reevaluation of the patient after these medicines showed that the patient improved I have reviewed the patients home medicines and have made adjustments as needed   Problem List / ED Course:  Patient presented to the emergency department complaints of vomiting.  He reports that he had vomiting began earlier today around 10 AM and has been unable to keep any food or drink down since then.  Does report that he drinks alcohol daily and uses marijuana daily.  Has been seen in the emergency department multiple times for similar symptoms previously.  Patient previously has been treated with antiemetics without significant improvement in his symptoms but does report improvement in nausea with dose of Haldol and Benadryl.  Decided to initiate treatment with fluids and Haldol and Benadryl as well as Protonix.  Will reassess patient  soon. On reassessment of patient, patient reports he has not had any episodes of vomiting since medications were administered.  Given improvement in symptoms and patient condition significantly improved, I believe that patient is safe for discharge at this time.  Did encourage patient to avoid use of alcohol and marijuana as these are likely contributing to his symptoms are possibly causing his symptoms.  Patient reports that he is attempting to cut down on use of these substances.  Advised patient to return to the emergency department if his symptoms return or are worsening.  Patient is agreeable with this treatment plan verbalized understanding all return precautions.  Refill of Zofran  ODT sent to patient's pharmacy.  All questions answered prior to patient discharge.   Social Determinants of Health:  No primary care provider  Final Clinical Impression(s) / ED Diagnoses Final diagnoses:  Nausea and vomiting, unspecified vomiting type  Leukocytosis, unspecified type    Rx / DC Orders ED Discharge Orders          Ordered    ondansetron (ZOFRAN-ODT) 4 MG disintegrating tablet  Every 8 hours PRN        04/04/23 1859              Smitty Knudsen, PA-C 04/04/23 1906    Rondel Baton, MD 04/05/23 [phone]

## 2023-04-04 NOTE — Discharge Instructions (Addendum)
You were seen in the emergency department for nausea and vomiting.  I would strongly recommend that you discontinue use of alcohol and marijuana as this is likely contributing to your symptoms.  You did have good symptomatic control and resolution with dose of Haldol and Benadryl here in the emergency department.  If you feel your symptoms are worsening please return to the emergency department for further evaluation.

## 2023-04-22 ENCOUNTER — Other Ambulatory Visit: Payer: Self-pay

## 2023-04-22 ENCOUNTER — Encounter (HOSPITAL_COMMUNITY): Payer: Self-pay

## 2023-04-22 ENCOUNTER — Emergency Department (HOSPITAL_COMMUNITY)
Admission: EM | Admit: 2023-04-22 | Discharge: 2023-04-22 | Disposition: A | Discharge status: Home / Self Care | Payer: 59 | Source: Home / Self Care | Attending: Emergency Medicine | Admitting: Emergency Medicine

## 2023-04-22 ENCOUNTER — Emergency Department (HOSPITAL_COMMUNITY)
Admission: EM | Admit: 2023-04-22 | Discharge: 2023-04-22 | Disposition: A | Discharge status: Home / Self Care | Payer: 59 | Attending: Student | Admitting: Student

## 2023-04-22 ENCOUNTER — Encounter (HOSPITAL_COMMUNITY): Payer: Self-pay | Admitting: Emergency Medicine

## 2023-04-22 DIAGNOSIS — R7401 Elevation of levels of liver transaminase levels: Secondary | ICD-10-CM | POA: Diagnosis not present

## 2023-04-22 DIAGNOSIS — R9431 Abnormal electrocardiogram [ECG] [EKG]: Secondary | ICD-10-CM | POA: Diagnosis not present

## 2023-04-22 DIAGNOSIS — R1115 Cyclical vomiting syndrome unrelated to migraine: Secondary | ICD-10-CM

## 2023-04-22 DIAGNOSIS — R112 Nausea with vomiting, unspecified: Secondary | ICD-10-CM

## 2023-04-22 LAB — CBC WITH DIFFERENTIAL/PLATELET
Abs Immature Granulocytes: 0.02 10*3/uL (ref 0.00–0.07)
Basophils Absolute: 0.1 10*3/uL (ref 0.0–0.1)
Basophils Relative: 1 %
Eosinophils Absolute: 0 10*3/uL (ref 0.0–0.5)
Eosinophils Relative: 0 %
HCT: 48.1 % (ref 39.0–52.0)
Hemoglobin: 16.7 g/dL (ref 13.0–17.0)
Immature Granulocytes: 0 %
Lymphocytes Relative: 13 %
Lymphs Abs: 0.8 10*3/uL (ref 0.7–4.0)
MCH: 32.2 pg (ref 26.0–34.0)
MCHC: 34.7 g/dL (ref 30.0–36.0)
MCV: 92.9 fL (ref 80.0–100.0)
Monocytes Absolute: 0.6 10*3/uL (ref 0.1–1.0)
Monocytes Relative: 9 %
Neutro Abs: 4.7 10*3/uL (ref 1.7–7.7)
Neutrophils Relative %: 77 %
Platelets: 291 10*3/uL (ref 150–400)
RBC: 5.18 MIL/uL (ref 4.22–5.81)
RDW: 13.5 % (ref 11.5–15.5)
WBC: 6.2 10*3/uL (ref 4.0–10.5)
nRBC: 0 % (ref 0.0–0.2)

## 2023-04-22 LAB — COMPREHENSIVE METABOLIC PANEL
ALT: 75 U/L — ABNORMAL HIGH (ref 0–44)
AST: 74 U/L — ABNORMAL HIGH (ref 15–41)
Albumin: 5.3 g/dL — ABNORMAL HIGH (ref 3.5–5.0)
Alkaline Phosphatase: 75 U/L (ref 38–126)
Anion gap: 15 (ref 5–15)
BUN: 7 mg/dL (ref 6–20)
CO2: 23 mmol/L (ref 22–32)
Calcium: 10.1 mg/dL (ref 8.9–10.3)
Chloride: 100 mmol/L (ref 98–111)
Creatinine, Ser: 0.83 mg/dL (ref 0.61–1.24)
GFR, Estimated: >60 mL/min (ref >60)
Glucose, Bld: 122 mg/dL — ABNORMAL HIGH (ref 70–99)
Potassium: 4 mmol/L (ref 3.5–5.1)
Sodium: 138 mmol/L (ref 135–145)
Total Bilirubin: 2.8 mg/dL — ABNORMAL HIGH (ref 0.3–1.2)
Total Protein: 8.7 g/dL — ABNORMAL HIGH (ref 6.5–8.1)

## 2023-04-22 LAB — ETHANOL: Alcohol, Ethyl (B): <10 mg/dL (ref <10)

## 2023-04-22 LAB — LIPASE, BLOOD: Lipase: 26 U/L (ref 11–51)

## 2023-04-22 MED ORDER — LACTATED RINGERS IV BOLUS
2000.0000 mL | Freq: Once | INTRAVENOUS | Status: AC
  Administered 2023-04-22: 2000 mL via INTRAVENOUS

## 2023-04-22 MED ORDER — PROMETHAZINE HCL 25 MG RE SUPP: 25.0000 mg | Freq: Four times a day (QID) | RECTAL | 0 refills | Status: DC | PRN

## 2023-04-22 MED ORDER — DIPHENHYDRAMINE HCL 50 MG/ML IJ SOLN
50.0000 mg | Freq: Once | INTRAMUSCULAR | Status: AC
  Administered 2023-04-22: 50 mg via INTRAVENOUS
  Filled 2023-04-22: qty 1

## 2023-04-22 MED ORDER — HALOPERIDOL LACTATE 5 MG/ML IJ SOLN
5.0000 mg | Freq: Once | INTRAMUSCULAR | Status: AC
  Administered 2023-04-22: 5 mg via INTRAVENOUS
  Filled 2023-04-22: qty 1

## 2023-04-22 MED ORDER — PANTOPRAZOLE SODIUM 40 MG IV SOLR
40.0000 mg | Freq: Once | INTRAVENOUS | Status: AC
  Administered 2023-04-22: 40 mg via INTRAVENOUS
  Filled 2023-04-22: qty 10

## 2023-04-22 MED ORDER — DROPERIDOL 2.5 MG/ML IJ SOLN
2.5000 mg | Freq: Once | INTRAMUSCULAR | Status: AC
  Administered 2023-04-22: 2.5 mg via INTRAVENOUS
  Filled 2023-04-22: qty 2

## 2023-04-22 MED ORDER — LACTATED RINGERS IV BOLUS
1000.0000 mL | Freq: Once | INTRAVENOUS | Status: AC
  Administered 2023-04-22: 1000 mL via INTRAVENOUS

## 2023-04-22 NOTE — ED Notes (Addendum)
Upon this RN's assessment patient c/o nausea and vomiting and reports about 10 episodes since his discharge. Denies hematemesis. Trash can filled will clear liquid and floor wet from sink to bed. Emesis bag provided to patient for appropriate inspection and measurement. He denies pain. Bowels sounds auscultated in all 4 quadrants. Denies diarrhea. Abdomen not tender to touch upon this RN's exam. Timothy [name]

## 2023-04-22 NOTE — ED Notes (Signed)
When talking with pt, he stated he went out last night and drank on an empty stomach. This was in reference to me encouraging him to stop putting his finger in his throat to make himself vomit.

## 2023-04-22 NOTE — ED Notes (Signed)
Pt continues to state that he is unable to urinate. Pt is drinking and eating crackers.

## 2023-04-22 NOTE — ED Triage Notes (Signed)
Pt been vomiting x 3hrs. Drinks 6+ "tall cans" beer daily. Last had last night.

## 2023-04-22 NOTE — ED Notes (Signed)
PT alert, oriented, and ambulatory upon DC. He is aware that he has a script for phenergan at [facility]. Timothy [name]

## 2023-04-22 NOTE — ED Notes (Signed)
Upon initial assessment, observed along with another RN when pulling back curtain to walk into room, pt was leaned over the trash can with his finger down his throat, gagging. Also has with him 2 bottles of water that are empty on floor beside bed.

## 2023-04-22 NOTE — ED Notes (Signed)
Ginger ale provided for PO challenge. Pt reports he has drank water and kept it down. Denies pain. B Liliahna Cudd,RN

## 2023-04-22 NOTE — Discharge Instructions (Addendum)
You have been seen today for your complaint of vomiting. Your discharge medications include Phenergan.  This is a nausea medicine.  Take it as needed in order to prevent vomiting. Home care instructions are as follows:  Discontinue using marijuana.  Decrease your alcohol use Follow up with: Your primary care provider in 1 week Please seek immediate medical care if you develop any of the following symptoms: You have blood in your vomit. Your vomit looks like coffee grounds. You have stools that are bloody or black, or stools that look like tar. You have signs of dehydration, such as: Sunken eyes. Not making tears while crying. Very dry mouth or cracked lips. Decreased urine production. Dark urine. Urine may be the color of tea. Weakness. Sleepiness. At this time there does not appear to be the presence of an emergent medical condition, however there is always the potential for conditions to change. Please read and follow the below instructions.  Do not take your medicine if  develop an itchy rash, swelling in your mouth or lips, or difficulty breathing; call 911 and seek immediate emergency medical attention if this occurs.  You may review your lab tests and imaging results in their entirety on your MyChart account.  Please discuss all results of fully with your primary care provider and other specialist at your follow-up visit.  Note: Portions of this text may have been transcribed using voice recognition software. Every effort was made to ensure accuracy; however, inadvertent computerized transcription errors may still be present.

## 2023-04-22 NOTE — ED Triage Notes (Signed)
Pt returned to ED after discharge a short while ago, stating his vomiting resumed about 30 mins after leaving the ED. States he has vomited "a lot" since symptoms returned.

## 2023-04-22 NOTE — ED Provider Notes (Signed)
[place] EMERGENCY DEPARTMENT AT [facility] Provider Note   CSN: [id number] Arrival date & time: 04/22/23  8657     History  Chief Complaint  Patient presents with   Emesis    Timothy [name] is a 27 y.o. male.  With a history of alcohol abuse, cannabis hyperemesis syndrome who presents to the ED for evaluation of vomiting.  He was seen here few hours earlier, his symptoms had improved and he was discharged home.  Shortly after he went home he began vomiting again.  States he has vomited 10-12 times since his discharge.  He denies any abdominal pain.  He has Zofran ODT at home but states that he did not use it because it typically does not work.  Denies chest pain, shortness of breath, hematemesis, fevers, chills.  He states he drank 3 24 ounce beers today.  Last smoked marijuana yesterday.  He smokes marijuana daily.  He denies other recreational drug use.   Emesis      Home Medications Prior to Admission medications   Medication Sig Start Date End Date Taking? Authorizing Provider  promethazine (PHENERGAN) 25 MG suppository Place 1 suppository (25 mg total) rectally every 6 (six) hours as needed for nausea or vomiting. 04/22/23  Yes Saivon Prowse, Edsel Petrin, PA-C  acetaminophen (TYLENOL) 500 MG tablet Take 500 mg by mouth every 6 (six) hours as needed for moderate pain.    [provider]  ibuprofen (ADVIL) 200 MG tablet Take 400 mg by mouth every 6 (six) hours as needed for moderate pain.    [provider]  omeprazole (PRILOSEC) 20 MG capsule Take 1 capsule (20 mg total) by mouth daily. 08/01/22   Triplett, Tammy, PA-C  ondansetron (ZOFRAN-ODT) 4 MG disintegrating tablet Take 1 tablet (4 mg total) by mouth every 8 (eight) hours as needed for nausea or vomiting. Patient not taking: Reported on 04/22/2023 04/04/23   Smitty Knudsen, PA-C      Allergies    Amoxil [amoxicillin]    Review of Systems   Review of Systems  Gastrointestinal:  Positive for vomiting.   All other systems reviewed and are negative.   Physical Exam Updated Vital Signs BP 136/88   Pulse 82   Temp 98.1 F (36.7 C) (Oral)   Resp 14   Ht 5\' 4"  (1.626 m)   Wt 72.6 kg   SpO2 99%   BMI 27.46 kg/m  Physical Exam Vitals and nursing note reviewed.  Constitutional:      General: He is not in acute distress.    Appearance: Normal appearance. He is well-developed. He is not ill-appearing, toxic-appearing or diaphoretic.     Comments: Resting comfortably in bed  HENT:     Head: Normocephalic and atraumatic.  Eyes:     Conjunctiva/sclera: Conjunctivae normal.  Cardiovascular:     Rate and Rhythm: Normal rate and regular rhythm.     Heart sounds: No murmur heard. Pulmonary:     Effort: Pulmonary effort is normal. No respiratory distress.     Breath sounds: Normal breath sounds.  Abdominal:     Palpations: Abdomen is soft.     Tenderness: There is no abdominal tenderness. There is no guarding.  Musculoskeletal:        General: No swelling.     Cervical back: Neck supple.  Skin:    General: Skin is warm and dry.     Capillary Refill: Capillary refill takes less than 2 seconds.  Neurological:  Mental Status: He is alert and oriented to person, place, and time.  Psychiatric:        Mood and Affect: Mood normal.     ED Results / Procedures / Treatments   Labs (all labs ordered are listed, but only abnormal results are displayed) Labs Reviewed  ETHANOL    EKG EKG Interpretation  Date/Time:  Sunday April 22 2023 21:18:32 EDT Ventricular Rate:  57 PR Interval:  117 QRS Duration: 94 QT Interval:  405 QTC Calculation: 395 R Axis:   79 Text Interpretation: Sinus rhythm Borderline short PR interval LVH by voltage No acute changes No significant change since last tracing Confirmed by Derwood Kaplan [id number]) on 04/22/2023 10:37:38 PM  Radiology No results found.  Procedures Procedures    Medications Ordered in ED Medications  lactated ringers bolus 2,000  mL (2,000 mLs Intravenous New Bag/Given 04/22/23 2208)  droperidol (INAPSINE) 2.5 MG/ML injection 2.5 mg (2.5 mg Intravenous Given 04/22/23 2206)    ED Course/ Medical Decision Making/ A&P                             Medical Decision Making Amount and/or Complexity of Data Reviewed Labs: ordered.  Risk Prescription drug management.  This patient presents to the ED for concern of vomiting, this involves an extensive number of treatment options, and is a complaint that carries with it a high risk of complications and morbidity.  The differential diagnosis includes gastroenteritis, cyclic vomiting syndrome cannabis hyperemesis  My initial workup includes fluids, droperidol  Additional history obtained from: Nursing notes from this visit.  I ordered, reviewed and interpreted labs which include: Ethanol, CBC, CMP, lipase from earlier today at his previous ED visit  Afebrile, hemodynamically stable.  27 year old male presenting to the ED for evaluation of vomiting.  He initially presented earlier today for same.  His symptoms improved and he was discharged home.  When he got home his vomiting returned.  He has had numerous presentations for vomiting in the past.  He smokes marijuana daily.  Cannabis hyperemesis is most likely diagnosis.  He has a nonfocal exam.  He is in no acute distress.  He was given fluids and droperidol in the ED.  He reported improvement in his symptoms.  He then passed his p.o. challenge in the ED. he did not vomit for greater than 2 hours.  He was again encouraged to discontinue smoking marijuana and decrease his alcohol use.  He was sent a prescription for rectal phenergan as he states that his Zofran does not work.  He was given return precautions.  Stable at discharge.  At this time there does not appear to be any evidence of an acute emergency medical condition and the patient appears stable for discharge with appropriate outpatient follow up. Diagnosis was discussed with  patient who verbalizes understanding of care plan and is agreeable to discharge. I have discussed return precautions with patient who verbalizes understanding. Patient encouraged to follow-up with their PCP within 1 week. All questions answered.  Patient's case discussed with Dr. Rhunette Croft who agrees with plan to discharge with follow-up.   Note: Portions of this report may have been transcribed using voice recognition software. Every effort was made to ensure accuracy; however, inadvertent computerized transcription errors may still be present.        Final Clinical Impression(s) / ED Diagnoses Final diagnoses:  Cyclical vomiting    Rx / DC Orders ED Discharge Orders  Ordered    promethazine (PHENERGAN) 25 MG suppository  Every 6 hours PRN        04/22/23 2332              Mora Bellman 04/22/23 2334    Derwood Kaplan, MD 04/24/23 [phone]

## 2023-04-22 NOTE — ED Notes (Signed)
Pt states he is unable to urinate at this time.

## 2023-04-22 NOTE — ED Provider Notes (Signed)
 [place] EMERGENCY DEPARTMENT AT [facility] Provider Note   CSN: [id number] Arrival date & time: 04/22/23  1340     History Chief Complaint  Patient presents with   Emesis    Timothy [name] is a 27 y.o. male. Patient presents to the ED with complaints of nausea and vomiting. Endorses alcohol and marijuana use daily. Denies any recent changes in use. Has been seen numerous times in the ED here for similar complaints. When I first evaluated this patient, he was actively vomiting into the trash can in the room. Denies hematemesis. Denies diarrhea, dysuria, chest pain, or shortness of breath.   Emesis      Home Medications Prior to Admission medications   Medication Sig Start Date End Date Taking? Authorizing Provider  acetaminophen (TYLENOL) 500 MG tablet Take 500 mg by mouth every 6 (six) hours as needed for moderate pain.   Yes [provider]  ibuprofen (ADVIL) 200 MG tablet Take 400 mg by mouth every 6 (six) hours as needed for moderate pain.   Yes [provider]  omeprazole (PRILOSEC) 20 MG capsule Take 1 capsule (20 mg total) by mouth daily. 08/01/22  Yes Triplett, Tammy, PA-C  ondansetron (ZOFRAN-ODT) 4 MG disintegrating tablet Take 1 tablet (4 mg total) by mouth every 8 (eight) hours as needed for nausea or vomiting. Patient not taking: Reported on 04/22/2023 04/04/23   Smitty Knudsen, PA-C      Allergies    Amoxil [amoxicillin]    Review of Systems   Review of Systems  Gastrointestinal:  Positive for vomiting.  All other systems reviewed and are negative.   Physical Exam Updated Vital Signs BP (!) 133/98   Pulse 73   Temp 97.6 F (36.4 C) (Oral)   Resp 16   Ht 5\' 4"  (1.626 m)   Wt 72.6 kg   SpO2 100%   BMI 27.46 kg/m  Physical Exam Vitals and nursing note reviewed.  Constitutional:      General: He is not in acute distress.    Appearance: He is well-developed.  HENT:     Head: Normocephalic and atraumatic.  Eyes:      Conjunctiva/sclera: Conjunctivae normal.  Cardiovascular:     Rate and Rhythm: Normal rate and regular rhythm.     Heart sounds: No murmur heard. Pulmonary:     Effort: Pulmonary effort is normal. No respiratory distress.     Breath sounds: Normal breath sounds.  Abdominal:     Palpations: Abdomen is soft.     Tenderness: There is no abdominal tenderness.  Musculoskeletal:        General: No swelling.     Cervical back: Neck supple.  Skin:    General: Skin is warm and dry.     Capillary Refill: Capillary refill takes less than 2 seconds.  Neurological:     General: No focal deficit present.     Mental Status: He is alert.  Psychiatric:        Mood and Affect: Mood normal.     ED Results / Procedures / Treatments   Labs (all labs ordered are listed, but only abnormal results are displayed) Labs Reviewed  COMPREHENSIVE METABOLIC PANEL - Abnormal; Notable for the following components:      Result Value   Glucose, Bld 122 (*)    Total Protein 8.7 (*)    Albumin 5.3 (*)    AST 74 (*)    ALT 75 (*)    Total Bilirubin  2.8 (*)    All other components within normal limits  CBC WITH DIFFERENTIAL/PLATELET  LIPASE, BLOOD  RAPID URINE DRUG SCREEN, HOSP PERFORMED    EKG None  Radiology No results found.  Procedures Procedures   Medications Ordered in ED Medications  lactated ringers bolus 1,000 mL (1,000 mLs Intravenous New Bag/Given 04/22/23 1502)  pantoprazole (PROTONIX) injection 40 mg (40 mg Intravenous Given 04/22/23 1530)  haloperidol lactate (HALDOL) injection 5 mg (5 mg Intravenous Given 04/22/23 1530)  diphenhydrAMINE (BENADRYL) injection 50 mg (50 mg Intravenous Given 04/22/23 1528)    ED Course/ Medical Decision Making/ A&P                           Medical Decision Making Amount and/or Complexity of Data Reviewed Labs: ordered.  Risk Prescription drug management.   This patient presents to the ED for concern of vomiting.  Differential diagnosis includes  cyclic vomiting, gastroenteritis, binge drinking    Lab Tests:  I Ordered, and personally interpreted labs.  The pertinent results include: CBC reassuring, CMP reassuring with elevation in liver enzymes, lipase normal, UDS pending  Medicines ordered and prescription drug management:  I ordered medication including fluids, Benadryl, Protonix, Haldol for vomiting Reevaluation of the patient after these medicines showed that the patient improved I have reviewed the patients home medicines and have made adjustments as needed   Problem List / ED Course:  Patient presented to the ED with concerns of vomiting. He is well known to the ER here and has presented for similar concerns numerous times. He endorses alcohol and marijuana use daily. Denies any recent changes in quantity consumed. Denies hematemesis. Denies abdominal pain, chest pain, or trouble breathing. Initially was vomiting into trash can when I evaluated the patient. Will order basic labs to ensure no severe electrolyte disturbances or potential occult bleeding. Will treat with fluids, benadryl, haldol, and Protonix and then reassess patient. Patient reporting improvement in symptoms after administration of IV fluids, Benadryl, Haldol, Protonix.  Discussed with patient the importance of reducing/stopping use of alcohol and marijuana use as this is likely causing patient's symptoms and recurrent visits to the emergency department.  Low suspicion at this time for acute abdominal pathology requiring any surgical intervention such as appendicitis, cholecystitis, bowel obstruction as patient's abdomen is nontender.  Patient states that he knows he needs to cut down on using these substances.  At this time, I believe that patient is stable and safe for discharge home given no acute decline regression in symptoms after administration of medications.  Encourage patient return to the emergency department if symptoms return or worsen.  Social  Determinants of Health:  Alcohol use disorder No primary care provider  Final Clinical Impression(s) / ED Diagnoses Final diagnoses:  Nausea and vomiting, unspecified vomiting type    Rx / DC Orders ED Discharge Orders     None         Salomon Mast 04/22/23 Dennison Bulla, MD 04/23/23 2226

## 2023-04-22 NOTE — Discharge Instructions (Signed)
You were seen in the ED for concerns of nausea and vomiting. This is likely due to the use of alcohol and marijuana. You had resolution of symptoms with medications given to you in the ER. If your symptoms return, please come back to the ER. Otherwise, I would strongly advise that you stop/reduce alcohol use and stop marijuana use as these are likely contributing to these episodes of vomiting you have been getting.

## 2023-05-04 ENCOUNTER — Other Ambulatory Visit: Payer: Self-pay

## 2023-05-04 ENCOUNTER — Emergency Department (HOSPITAL_COMMUNITY)
Admission: EM | Admit: 2023-05-04 | Discharge: 2023-05-05 | Disposition: A | Discharge status: Home / Self Care | Payer: 59 | Attending: Emergency Medicine | Admitting: Emergency Medicine

## 2023-05-04 ENCOUNTER — Encounter (HOSPITAL_COMMUNITY): Payer: Self-pay | Admitting: Emergency Medicine

## 2023-05-04 DIAGNOSIS — R197 Diarrhea, unspecified: Secondary | ICD-10-CM | POA: Diagnosis not present

## 2023-05-04 DIAGNOSIS — R109 Unspecified abdominal pain: Secondary | ICD-10-CM | POA: Diagnosis not present

## 2023-05-04 DIAGNOSIS — R112 Nausea with vomiting, unspecified: Secondary | ICD-10-CM | POA: Diagnosis not present

## 2023-05-04 DIAGNOSIS — R1110 Vomiting, unspecified: Secondary | ICD-10-CM | POA: Diagnosis not present

## 2023-05-04 LAB — COMPREHENSIVE METABOLIC PANEL
ALT: 63 U/L — ABNORMAL HIGH (ref 0–44)
AST: 53 U/L — ABNORMAL HIGH (ref 15–41)
Albumin: 5.5 g/dL — ABNORMAL HIGH (ref 3.5–5.0)
Alkaline Phosphatase: 78 U/L (ref 38–126)
Anion gap: 19 — ABNORMAL HIGH (ref 5–15)
BUN: 10 mg/dL (ref 6–20)
CO2: 22 mmol/L (ref 22–32)
Calcium: 10.5 mg/dL — ABNORMAL HIGH (ref 8.9–10.3)
Chloride: 97 mmol/L — ABNORMAL LOW (ref 98–111)
Creatinine, Ser: 0.85 mg/dL (ref 0.61–1.24)
GFR, Estimated: >60 mL/min (ref >60)
Glucose, Bld: 105 mg/dL — ABNORMAL HIGH (ref 70–99)
Potassium: 3.6 mmol/L (ref 3.5–5.1)
Sodium: 138 mmol/L (ref 135–145)
Total Bilirubin: 3.3 mg/dL — ABNORMAL HIGH (ref 0.3–1.2)
Total Protein: 8.8 g/dL — ABNORMAL HIGH (ref 6.5–8.1)

## 2023-05-04 LAB — CBC
HCT: 46.5 % (ref 39.0–52.0)
Hemoglobin: 16.4 g/dL (ref 13.0–17.0)
MCH: 33 pg (ref 26.0–34.0)
MCHC: 35.3 g/dL (ref 30.0–36.0)
MCV: 93.6 fL (ref 80.0–100.0)
Platelets: 265 10*3/uL (ref 150–400)
RBC: 4.97 MIL/uL (ref 4.22–5.81)
RDW: 13.4 % (ref 11.5–15.5)
WBC: 10 10*3/uL (ref 4.0–10.5)
nRBC: 0 % (ref 0.0–0.2)

## 2023-05-04 LAB — LIPASE, BLOOD: Lipase: 27 U/L (ref 11–51)

## 2023-05-04 MED ORDER — ONDANSETRON 8 MG PO TBDP
8.0000 mg | ORAL_TABLET | Freq: Once | ORAL | Status: AC
  Administered 2023-05-04: 8 mg via ORAL
  Filled 2023-05-04: qty 1

## 2023-05-04 NOTE — ED Triage Notes (Addendum)
Pt with c/o emesis since 6pm tonight. Pt admits to smoking weed and drinking 2 beers.

## 2023-05-04 NOTE — ED Provider Notes (Signed)
[place] EMERGENCY DEPARTMENT AT [facility] Provider Note   CSN: [id number] Arrival date & time: 05/04/23  2232     History {Add pertinent medical, surgical, social history, OB history to HPI:1} Chief Complaint  Patient presents with   Emesis    Timothy [name] is a 27 y.o. male.  The history is provided by the patient.   Patient presents with vomiting.  Patient reports that around 6 PM he had multiple episodes of nonbloody emesis.  He is also had some nonbloody diarrhea.  He suspects this is related to drinking alcohol around 1 PM.  He also admits to smoking marijuana.  He has had these episodes before.  He also reports abdominal cramping.   Past Medical History:  Diagnosis Date   Cannabinoid hyperemesis syndrome    Febrile seizures (HCC)    last at 76 y of age    Home Medications Prior to Admission medications   Medication Sig Start Date End Date Taking? Authorizing Provider  omeprazole (PRILOSEC) 20 MG capsule Take 1 capsule (20 mg total) by mouth daily. 08/01/22   Triplett, Tammy, PA-C  ondansetron (ZOFRAN-ODT) 4 MG disintegrating tablet Take 1 tablet (4 mg total) by mouth every 8 (eight) hours as needed for nausea or vomiting. Patient not taking: Reported on 04/22/2023 04/04/23   Smitty Knudsen, PA-C  promethazine (PHENERGAN) 25 MG suppository Place 1 suppository (25 mg total) rectally every 6 (six) hours as needed for nausea or vomiting. 04/22/23   Schutt, Edsel Petrin, PA-C      Allergies    Amoxil [amoxicillin]    Review of Systems   Review of Systems  Gastrointestinal:  Positive for abdominal pain, diarrhea, nausea and vomiting.    Physical Exam Updated Vital Signs BP (!) 166/114   Pulse 86   Temp 98.9 F (37.2 C) (Oral)   Resp 16   Ht 1.626 m (5\' 4" )   Wt 73 kg   SpO2 98%   BMI 27.62 kg/m  Physical Exam CONSTITUTIONAL: Well developed/well nourished, actively vomiting in the trash can HEAD: Normocephalic/atraumatic EYES: EOMI/PERRL ENMT:  Mucous membranes moist NECK: supple no meningeal signs SPINE/BACK:entire spine nontender CV: S1/S2 noted, no murmurs/rubs/gallops noted LUNGS: Lungs are clear to auscultation bilaterally, no apparent distress ABDOMEN: soft, nontender, no rebound or guarding, bowel sounds noted throughout abdomen NEURO: Pt is awake/alert/appropriate, moves all extremitiesx4.  No facial droop.  Patient ambulates without difficulty EXTREMITIES:  full ROM SKIN: warm, color normal PSYCH: no abnormalities of mood noted, alert and oriented to situation  ED Results / Procedures / Treatments   Labs (all labs ordered are listed, but only abnormal results are displayed) Labs Reviewed  COMPREHENSIVE METABOLIC PANEL - Abnormal; Notable for the following components:      Result Value   Chloride 97 (*)    Glucose, Bld 105 (*)    Calcium 10.5 (*)    Total Protein 8.8 (*)    Albumin 5.5 (*)    AST 53 (*)    ALT 63 (*)    Total Bilirubin 3.3 (*)    Anion gap 19 (*)    All other components within normal limits  LIPASE, BLOOD  CBC    EKG None  Radiology No results found.  Procedures Procedures  {Document cardiac monitor, telemetry assessment procedure when appropriate:1}  Medications Ordered in ED Medications  ondansetron (ZOFRAN-ODT) disintegrating tablet 8 mg (has no administration in time range)    ED Course/ Medical Decision Making/ A&P Clinical Course as  of 05/04/23 2342  Fri May 04, 2023  2341 Total Bilirubin(!): 3.3 Acute on chronic hyperbilirubinemia [DW]    Clinical Course User Index [DW] Zadie Rhine, MD   {   Click here for ABCD2, HEART and other calculatorsREFRESH Note before signing :1}                          Medical Decision Making Amount and/or Complexity of Data Reviewed Labs: ordered. Decision-making details documented in ED Course.  Risk Prescription drug management.   This patient presents to the ED for concern of vomiting, this involves an extensive number of  treatment options, and is a complaint that carries with it a high risk of complications and morbidity.  The differential diagnosis includes but is not limited to cholecystitis, cholelithiasis, pancreatitis, gastritis, peptic ulcer disease, appendicitis, bowel obstruction, bowel perforation,     Comorbidities that complicate the patient evaluation: Patient's presentation is complicated by their history of recurrent vomiting  Social Determinants of Health: Patient's  alcohol and marijuana use   increases the complexity of managing their presentation  Additional history obtained: Additional history obtained from family   Lab Tests: I Ordered, and personally interpreted labs.  The pertinent results include: Hyperbilirubinemia, mild elevation anion gap   Cardiac Monitoring: The patient was maintained on a cardiac monitor.  I personally viewed and interpreted the cardiac monitor which showed an underlying rhythm of:  {cardiac monitor:26849}  Medicines ordered and prescription drug management: I ordered medication including Zofran for nausea Reevaluation of the patient after these medicines showed that the patient    {resolved/improved/worsened:23923::"improved"}  Test Considered: Patient is low risk / negative by ***, therefore do not feel that *** is indicated.  Critical Interventions:  ***  Consultations Obtained: I requested consultation with the {consultation:26851}, and discussed  findings as well as pertinent plan - they recommend: ***  Reevaluation: After the interventions noted above, I reevaluated the patient and found that they have :{resolved/improved/worsened:23923::"improved"}  Complexity of problems addressed: Patient's presentation is most consistent with  {ZOXW:96045}  Disposition: After consideration of the diagnostic results and the patient's response to treatment,  I feel that the patent would benefit from {disposition:26850}.     {Document critical care  time when appropriate:1} {Document review of labs and clinical decision tools ie heart score, Chads2Vasc2 etc:1}  {Document your independent review of radiology images, and any outside records:1} {Document your discussion with family members, caretakers, and with consultants:1} {Document social determinants of health affecting pt's care:1} {Document your decision making why or why not admission, treatments were needed:1} Final Clinical Impression(s) / ED Diagnoses Final diagnoses:  None    Rx / DC Orders ED Discharge Orders     None

## 2023-05-05 DIAGNOSIS — R112 Nausea with vomiting, unspecified: Secondary | ICD-10-CM | POA: Diagnosis not present

## 2023-05-05 MED ORDER — PROMETHAZINE HCL 25 MG RE SUPP: 25.0000 mg | Freq: Four times a day (QID) | RECTAL | 0 refills | Status: DC | PRN

## 2023-05-05 MED ORDER — HALOPERIDOL LACTATE 5 MG/ML IJ SOLN
5.0000 mg | Freq: Once | INTRAMUSCULAR | Status: AC
  Administered 2023-05-05: 5 mg via INTRAMUSCULAR
  Filled 2023-05-05: qty 1

## 2023-05-05 NOTE — ED Notes (Signed)
Pt sticking his hands down his throat and making himself vomit in trash can

## 2023-05-18 ENCOUNTER — Emergency Department (HOSPITAL_COMMUNITY)
Admission: EM | Admit: 2023-05-18 | Discharge: 2023-05-19 | Disposition: A | Discharge status: Home / Self Care | Payer: 59 | Attending: Emergency Medicine | Admitting: Emergency Medicine

## 2023-05-18 ENCOUNTER — Other Ambulatory Visit: Payer: Self-pay

## 2023-05-18 ENCOUNTER — Encounter (HOSPITAL_COMMUNITY): Payer: Self-pay | Admitting: Emergency Medicine

## 2023-05-18 DIAGNOSIS — R112 Nausea with vomiting, unspecified: Secondary | ICD-10-CM | POA: Insufficient documentation

## 2023-05-18 DIAGNOSIS — F17210 Nicotine dependence, cigarettes, uncomplicated: Secondary | ICD-10-CM | POA: Insufficient documentation

## 2023-05-18 DIAGNOSIS — R7989 Other specified abnormal findings of blood chemistry: Secondary | ICD-10-CM | POA: Insufficient documentation

## 2023-05-18 DIAGNOSIS — R1110 Vomiting, unspecified: Secondary | ICD-10-CM | POA: Diagnosis not present

## 2023-05-18 LAB — COMPREHENSIVE METABOLIC PANEL
ALT: 76 U/L — ABNORMAL HIGH (ref 0–44)
AST: 69 U/L — ABNORMAL HIGH (ref 15–41)
Albumin: 5.6 g/dL — ABNORMAL HIGH (ref 3.5–5.0)
Alkaline Phosphatase: 84 U/L (ref 38–126)
Anion gap: 25 — ABNORMAL HIGH (ref 5–15)
BUN: 11 mg/dL (ref 6–20)
CO2: 20 mmol/L — ABNORMAL LOW (ref 22–32)
Calcium: 10.5 mg/dL — ABNORMAL HIGH (ref 8.9–10.3)
Chloride: 93 mmol/L — ABNORMAL LOW (ref 98–111)
Creatinine, Ser: 0.9 mg/dL (ref 0.61–1.24)
GFR, Estimated: >60 mL/min (ref >60)
Glucose, Bld: 85 mg/dL (ref 70–99)
Potassium: 4 mmol/L (ref 3.5–5.1)
Sodium: 138 mmol/L (ref 135–145)
Total Bilirubin: 2.7 mg/dL — ABNORMAL HIGH (ref 0.3–1.2)
Total Protein: 9.6 g/dL — ABNORMAL HIGH (ref 6.5–8.1)

## 2023-05-18 LAB — CBC
HCT: 49.4 % (ref 39.0–52.0)
Hemoglobin: 17 g/dL (ref 13.0–17.0)
MCH: 32.3 pg (ref 26.0–34.0)
MCHC: 34.4 g/dL (ref 30.0–36.0)
MCV: 93.7 fL (ref 80.0–100.0)
Platelets: 335 10*3/uL (ref 150–400)
RBC: 5.27 MIL/uL (ref 4.22–5.81)
RDW: 13.8 % (ref 11.5–15.5)
WBC: 11.5 10*3/uL — ABNORMAL HIGH (ref 4.0–10.5)
nRBC: 0 % (ref 0.0–0.2)

## 2023-05-18 LAB — LIPASE, BLOOD: Lipase: 22 U/L (ref 11–51)

## 2023-05-18 MED ORDER — ONDANSETRON HCL 4 MG/2ML IJ SOLN
4.0000 mg | Freq: Once | INTRAMUSCULAR | Status: AC | PRN
  Administered 2023-05-18: 4 mg via INTRAVENOUS
  Filled 2023-05-18: qty 2

## 2023-05-18 NOTE — ED Notes (Signed)
Called x 1 no answer

## 2023-05-18 NOTE — ED Triage Notes (Signed)
Pt c/o vomiting since 3pm, estimates 20-30 episodes of emesis. Denies smoking weed today. Pt was drinking last night but not today.

## 2023-05-19 DIAGNOSIS — R112 Nausea with vomiting, unspecified: Secondary | ICD-10-CM | POA: Diagnosis not present

## 2023-05-19 LAB — URINALYSIS, ROUTINE W REFLEX MICROSCOPIC
Bacteria, UA: NONE SEEN
Bilirubin Urine: NEGATIVE
Glucose, UA: NEGATIVE mg/dL
Ketones, ur: 80 mg/dL — AB
Leukocytes,Ua: NEGATIVE
Nitrite: NEGATIVE
Protein, ur: >=300 mg/dL — AB
Specific Gravity, Urine: 1.019 (ref 1.005–1.030)
pH: 6 (ref 5.0–8.0)

## 2023-05-19 MED ORDER — PROMETHAZINE HCL 25 MG PO TABS: 25.0000 mg | ORAL_TABLET | Freq: Four times a day (QID) | ORAL | 0 refills | Status: DC | PRN

## 2023-05-19 MED ORDER — LACTATED RINGERS IV BOLUS
1000.0000 mL | Freq: Once | INTRAVENOUS | Status: AC
  Administered 2023-05-19: 1000 mL via INTRAVENOUS

## 2023-05-19 MED ORDER — METOCLOPRAMIDE HCL 5 MG/ML IJ SOLN
10.0000 mg | Freq: Once | INTRAMUSCULAR | Status: AC
  Administered 2023-05-19: 10 mg via INTRAVENOUS
  Filled 2023-05-19: qty 2

## 2023-05-19 MED ORDER — DIPHENHYDRAMINE HCL 50 MG/ML IJ SOLN
25.0000 mg | Freq: Once | INTRAMUSCULAR | Status: AC
  Administered 2023-05-19: 25 mg via INTRAVENOUS
  Filled 2023-05-19: qty 1

## 2023-05-19 MED ORDER — ONDANSETRON 4 MG PO TBDP: 4.0000 mg | ORAL_TABLET | Freq: Three times a day (TID) | ORAL | 0 refills | Status: DC | PRN

## 2023-05-19 NOTE — ED Provider Notes (Signed)
[facility]-EMERGENCY DEPT [facility] Emergency Department Provider Note MRN:  [id number]  Arrival date & time: 05/19/23     Chief Complaint   Emesis   History of Present Illness   Timothy [name] is a 27 y.o. year-old male with a history of cyclical vomiting presenting to the ED with chief complaint of vomiting.  20-30 episodes of vomiting this evening.  No blood in the vomit.  Occasional abdominal cramping pain with the vomiting but not currently.  No fever.  Has happened before.  Review of Systems  A thorough review of systems was obtained and all systems are negative except as noted in the HPI and PMH.   Patient's Health History    Past Medical History:  Diagnosis Date   Cannabinoid hyperemesis syndrome    Febrile seizures (HCC)    last at 19 y of age    History reviewed. No pertinent surgical history.  Family History  Problem Relation Age of Onset   Diabetes Maternal Grandmother     Social History   Socioeconomic History   Marital status: Single    Spouse name: Not on file   Number of children: Not on file   Years of education: Not on file   Highest education level: Not on file  Occupational History   Not on file  Tobacco Use   Smoking status: Heavy Smoker    Packs/day: 1    Types: Cigarettes   Smokeless tobacco: Never  Vaping Use   Vaping Use: Never used  Substance and Sexual Activity   Alcohol use: Yes    Alcohol/week: 84.0 standard drinks of alcohol    Types: 84 Cans of beer per week    Comment: 12 pack of beer daily   Drug use: Yes    Frequency: 3.0 times per week    Types: Marijuana    Comment: smokes every day   Sexual activity: Yes    Birth control/protection: Condom  Other Topics Concern   Not on file  Social History Narrative   Not on file   Social Determinants of Health   Financial Resource Strain: Not on file  Food Insecurity: Not on file  Transportation Needs: Not on file  Physical Activity: Not on file  Stress: Not on file  Social  Connections: Not on file  Intimate Partner Violence: Not on file     Physical Exam   Vitals:   05/19/23 0130 05/19/23 0235  BP: (!) 163/97   Pulse:    Resp:  16  Temp:  98.1 F (36.7 C)  SpO2:      CONSTITUTIONAL: Well-appearing, NAD NEURO/PSYCH:  Alert and oriented x 3, no focal deficits EYES:  eyes equal and reactive ENT/NECK:  no LAD, no JVD CARDIO: Regular rate, well-perfused, normal S1 and S2 PULM:  CTAB no wheezing or rhonchi GI/GU:  non-distended, non-tender MSK/SPINE:  No gross deformities, no edema SKIN:  no rash, atraumatic   *Additional and/or pertinent findings included in MDM below  Diagnostic and Interventional Summary    EKG Interpretation Date/Time:    Ventricular Rate:    PR Interval:    QRS Duration:    QT Interval:    QTC Calculation:   R Axis:      Text Interpretation:         Labs Reviewed  COMPREHENSIVE METABOLIC PANEL - Abnormal; Notable for the following components:      Result Value   Chloride 93 (*)    CO2 20 (*)    Calcium  10.5 (*)    Total Protein 9.6 (*)    Albumin 5.6 (*)    AST 69 (*)    ALT 76 (*)    Total Bilirubin 2.7 (*)    Anion gap 25 (*)    All other components within normal limits  CBC - Abnormal; Notable for the following components:   WBC 11.5 (*)    All other components within normal limits  URINALYSIS, ROUTINE W REFLEX MICROSCOPIC - Abnormal; Notable for the following components:   Hgb urine dipstick SMALL (*)    Ketones, ur 80 (*)    Protein, ur >=300 (*)    All other components within normal limits  LIPASE, BLOOD    No orders to display    Medications  ondansetron (ZOFRAN) injection 4 mg (4 mg Intravenous Given 05/18/23 2356)  lactated ringers bolus 1,000 mL (1,000 mLs Intravenous New Bag/Given 05/19/23 0125)  lactated ringers bolus 1,000 mL (1,000 mLs Intravenous New Bag/Given 05/19/23 0124)  diphenhydrAMINE (BENADRYL) injection 25 mg (25 mg Intravenous Given 05/19/23 0126)     Procedures  /   Critical Care Procedures  ED Course and Medical Decision Making  Initial Impression and Ddx Suspect recurrent episode of cyclical vomiting.  Vital signs reassuring, abdomen soft and nontender.  Likely dehydrated, providing fluids, he requests Benadryl for his symptoms because it seems to work.  Anticipating discharge if feeling better.  Past medical/surgical history that increases complexity of ED encounter: Cyclical vomiting  Interpretation of Diagnostics I personally reviewed the laboratory assessment and my interpretation is as follows: Mild LFT elevation similar to prior, otherwise no significant laboratory changes    Patient Reassessment and Ultimate Disposition/Management     Patient continues to feel better with fluids, soft abdomen, no emergent process, appropriate for discharge.  Patient management required discussion with the following services or consulting groups:  None  Complexity of Problems Addressed Acute illness or injury that poses threat of life of bodily function  Additional Data Reviewed and Analyzed Further history obtained from: Further history from spouse/family member  Additional Factors Impacting ED Encounter Risk Consideration of hospitalization  Elmer Sow. Pilar Plate, MD [facility] Emergency Medicine [facility] [email or web address]  Final Clinical Impressions(s) / ED Diagnoses     ICD-10-CM   1. Nausea and vomiting, unspecified vomiting type  R11.2       ED Discharge Orders          Ordered    ondansetron (ZOFRAN-ODT) 4 MG disintegrating tablet  Every 8 hours PRN        05/19/23 0246    promethazine (PHENERGAN) 25 MG tablet  Every 6 hours PRN        05/19/23 0246             Discharge Instructions Discussed with and Provided to Patient:     Discharge Instructions      You were evaluated in the Emergency Department and after careful evaluation, we did not find any emergent condition requiring admission or further  testing in the hospital.  Your exam/testing today is overall reassuring.  Use the Zofran or Phenergan at home as needed for nausea or vomiting.  Follow-up with a GI doctor.  Please return to the Emergency Department if you experience any worsening of your condition.   Thank you for allowing [place] to be a part of your care.       Sabas Sous, MD 05/19/23 [phone]

## 2023-05-19 NOTE — Discharge Instructions (Addendum)
You were evaluated in the Emergency Department and after careful evaluation, we did not find any emergent condition requiring admission or further testing in the hospital.  Your exam/testing today is overall reassuring.  Use the Zofran or Phenergan at home as needed for nausea or vomiting.  Follow-up with a GI doctor.  Please return to the Emergency Department if you experience any worsening of your condition.   Thank you for allowing [place] to be a part of your care.

## 2023-06-16 ENCOUNTER — Encounter (HOSPITAL_COMMUNITY): Payer: Self-pay | Admitting: Emergency Medicine

## 2023-06-16 ENCOUNTER — Other Ambulatory Visit: Payer: Self-pay

## 2023-06-16 ENCOUNTER — Emergency Department (HOSPITAL_COMMUNITY)
Admission: EM | Admit: 2023-06-16 | Discharge: 2023-06-16 | Disposition: A | Discharge status: Home / Self Care | Payer: 59 | Attending: Emergency Medicine | Admitting: Emergency Medicine

## 2023-06-16 DIAGNOSIS — F1010 Alcohol abuse, uncomplicated: Secondary | ICD-10-CM | POA: Diagnosis not present

## 2023-06-16 DIAGNOSIS — R112 Nausea with vomiting, unspecified: Secondary | ICD-10-CM | POA: Insufficient documentation

## 2023-06-16 DIAGNOSIS — R1084 Generalized abdominal pain: Secondary | ICD-10-CM | POA: Diagnosis not present

## 2023-06-16 LAB — COMPREHENSIVE METABOLIC PANEL
ALT: 65 U/L — ABNORMAL HIGH (ref 0–44)
AST: 57 U/L — ABNORMAL HIGH (ref 15–41)
Albumin: 5.5 g/dL — ABNORMAL HIGH (ref 3.5–5.0)
Alkaline Phosphatase: 78 U/L (ref 38–126)
Anion gap: 20 — ABNORMAL HIGH (ref 5–15)
BUN: 8 mg/dL (ref 6–20)
CO2: 20 mmol/L — ABNORMAL LOW (ref 22–32)
Calcium: 10.2 mg/dL (ref 8.9–10.3)
Chloride: 99 mmol/L (ref 98–111)
Creatinine, Ser: 0.78 mg/dL (ref 0.61–1.24)
GFR, Estimated: >60 mL/min (ref >60)
Glucose, Bld: 95 mg/dL (ref 70–99)
Potassium: 3.7 mmol/L (ref 3.5–5.1)
Sodium: 139 mmol/L (ref 135–145)
Total Bilirubin: 2.7 mg/dL — ABNORMAL HIGH (ref 0.3–1.2)
Total Protein: 8.8 g/dL — ABNORMAL HIGH (ref 6.5–8.1)

## 2023-06-16 LAB — RAPID URINE DRUG SCREEN, HOSP PERFORMED
Amphetamines: NOT DETECTED
Barbiturates: NOT DETECTED
Benzodiazepines: NOT DETECTED
Cocaine: NOT DETECTED
Opiates: NOT DETECTED
Tetrahydrocannabinol: POSITIVE — AB

## 2023-06-16 LAB — LIPASE, BLOOD: Lipase: 27 U/L (ref 11–51)

## 2023-06-16 LAB — URINALYSIS, ROUTINE W REFLEX MICROSCOPIC
Bacteria, UA: NONE SEEN
Bilirubin Urine: NEGATIVE
Glucose, UA: NEGATIVE mg/dL
Hgb urine dipstick: NEGATIVE
Ketones, ur: 80 mg/dL — AB
Leukocytes,Ua: NEGATIVE
Nitrite: NEGATIVE
Protein, ur: >=300 mg/dL — AB
Specific Gravity, Urine: 1.024 (ref 1.005–1.030)
pH: 6 (ref 5.0–8.0)

## 2023-06-16 LAB — CBC
HCT: 45.5 % (ref 39.0–52.0)
Hemoglobin: 15.7 g/dL (ref 13.0–17.0)
MCH: 32.7 pg (ref 26.0–34.0)
MCHC: 34.5 g/dL (ref 30.0–36.0)
MCV: 94.8 fL (ref 80.0–100.0)
Platelets: 233 10*3/uL (ref 150–400)
RBC: 4.8 MIL/uL (ref 4.22–5.81)
RDW: 13.8 % (ref 11.5–15.5)
WBC: 11.8 10*3/uL — ABNORMAL HIGH (ref 4.0–10.5)
nRBC: 0 % (ref 0.0–0.2)

## 2023-06-16 MED ORDER — ONDANSETRON HCL 4 MG/2ML IJ SOLN
4.0000 mg | Freq: Once | INTRAMUSCULAR | Status: AC
  Administered 2023-06-16: 4 mg via INTRAVENOUS
  Filled 2023-06-16: qty 2

## 2023-06-16 MED ORDER — SODIUM CHLORIDE 0.9 % IV BOLUS
1000.0000 mL | Freq: Once | INTRAVENOUS | Status: AC
  Administered 2023-06-16: 1000 mL via INTRAVENOUS

## 2023-06-16 MED ORDER — CHLORDIAZEPOXIDE HCL 25 MG PO CAPS: ORAL_CAPSULE | ORAL | 0 refills | Status: DC

## 2023-06-16 MED ORDER — LACTATED RINGERS IV BOLUS
1000.0000 mL | Freq: Once | INTRAVENOUS | Status: AC
  Administered 2023-06-16: 1000 mL via INTRAVENOUS

## 2023-06-16 MED ORDER — DROPERIDOL 2.5 MG/ML IJ SOLN
2.5000 mg | Freq: Once | INTRAMUSCULAR | Status: AC
  Administered 2023-06-16: 2.5 mg via INTRAVENOUS
  Filled 2023-06-16: qty 2

## 2023-06-16 MED ORDER — DIAZEPAM 5 MG/ML IJ SOLN
5.0000 mg | Freq: Once | INTRAMUSCULAR | Status: AC
  Administered 2023-06-16: 5 mg via INTRAVENOUS
  Filled 2023-06-16: qty 2

## 2023-06-16 MED ORDER — PANTOPRAZOLE SODIUM 40 MG PO TBEC: 40.0000 mg | DELAYED_RELEASE_TABLET | Freq: Every day | ORAL | 0 refills | Status: DC

## 2023-06-16 NOTE — ED Triage Notes (Signed)
Pt ambulatory to triage with c/o n/v for last 3-4 hours.  Pt denies abd pain.

## 2023-06-16 NOTE — ED Notes (Signed)
Pt does not have hx of High BP

## 2023-06-16 NOTE — ED Notes (Signed)
Pt moved to FT room due to severe emesis in waiting room for 2h

## 2023-06-16 NOTE — ED Provider Notes (Signed)
  Physical Exam  BP 121/85   Pulse 69   Temp 98 F (36.7 C) (Oral)   Resp 16   SpO2 98%   Physical Exam Vitals and nursing note reviewed.  Constitutional:      General: He is not in acute distress.    Appearance: He is well-developed.  HENT:     Head: Normocephalic and atraumatic.  Eyes:     Conjunctiva/sclera: Conjunctivae normal.  Cardiovascular:     Rate and Rhythm: Normal rate and regular rhythm.     Heart sounds: No murmur heard. Pulmonary:     Effort: Pulmonary effort is normal. No respiratory distress.     Breath sounds: Normal breath sounds.  Abdominal:     Palpations: Abdomen is soft.     Tenderness: There is no abdominal tenderness.  Musculoskeletal:        General: No swelling.     Cervical back: Neck supple.  Skin:    General: Skin is warm and dry.     Capillary Refill: Capillary refill takes less than 2 seconds.  Neurological:     Mental Status: He is alert.  Psychiatric:        Mood and Affect: Mood normal.     Procedures  Procedures  ED Course / MDM    Medical Decision Making Amount and/or Complexity of Data Reviewed Labs: ordered.  Risk Prescription drug management.  This patient's care was assumed by me at shift change. The tests pending are urinalysis. Plan is for reevaluate and likely discharge after results.  Patient has history of alcohol and cannabis abuse, he has prior charting suggesting that he has cannabis hyperemesis as a cause of his recurrent abdominal pain and vomiting, usually responds to droperidol and Valium and IV fluids.  He was given these medications prior to my assuming care, on reevaluation he is feeling much better, his urinalysis does have significant ketones likely due to his frequent vomiting.  He is not in DKA.  He does have a significant anion gap though this is his baseline, possibly related to alcoholic ketoacidosis and decreased p.o. intake.  He is tolerating p.o. ginger ale at this time.  Patient's alcohol abuse  with him.  He states he is try to stop in the past but has not made it about 3 to 4 days at most because he gets shakes.  He does want to quit drinking alcohol, has been drinking daily since he was about 27 years old.  He is willing to stop and try a Librium taper.  We discussed danger of using alcohol with Librium and he understands cannot drink alcohol with Librium.  I advised him to follow-up with her PCP and seek outpatient counseling for alcohol and marijuana abuse as well.  Advise GI follow-up for his recurrent abdominal pain and vomiting he has never seen GI in the past he reports.  Labs reviewed, also show elevated AST ALT and bilirubin around his baseline.  Discussed findings with patient, he is aware that this likely related to his alcohol abuse.   He was given strict return precautions.       Josem Kaufmann 06/16/23 2106    Eber Hong, MD 06/17/23 [phone]

## 2023-06-16 NOTE — ED Provider Notes (Signed)
[facility] EMERGENCY DEPARTMENT AT [facility] Provider Note   CSN: [id number] Arrival date & time: 06/16/23  1455     History {Add pertinent medical, surgical, social history, OB history to HPI:1} Chief Complaint  Patient presents with   Emesis    Timothy [name] is a 27 y.o. male.  Patient with history of alcohol and marijuana abuse presents today with complaints of nausea and vomiting.  Patient states that he was drinking liquor last night which is unusual for him.  Woke up this morning feeling nauseous and tried to mow the yard and then began vomiting.  States that he also smoked marijuana yesterday.  He has been seen here several times and diagnosed with cannabis hyperemesis syndrome.  States that this feels the same.  He denies any abdominal pain.  Has been having regular bowel movements without diarrhea.  No fevers or chills or sick contacts.  The history is provided by the patient. No language interpreter was used.  Emesis      Home Medications Prior to Admission medications   Medication Sig Start Date End Date Taking? Authorizing Provider  omeprazole (PRILOSEC) 20 MG capsule Take 1 capsule (20 mg total) by mouth daily. 08/01/22   Triplett, Tammy, PA-C  ondansetron (ZOFRAN-ODT) 4 MG disintegrating tablet Take 1 tablet (4 mg total) by mouth every 8 (eight) hours as needed for nausea or vomiting. 05/19/23   Sabas Sous, MD  promethazine (PHENERGAN) 25 MG tablet Take 1 tablet (25 mg total) by mouth every 6 (six) hours as needed for nausea or vomiting. 05/19/23   Sabas Sous, MD      Allergies    Amoxil [amoxicillin]    Review of Systems   Review of Systems  Gastrointestinal:  Positive for nausea and vomiting.  All other systems reviewed and are negative.   Physical Exam Updated Vital Signs BP (!) 150/115   Pulse 88   Temp 97.6 F (36.4 C) (Oral)   Resp 17   SpO2 95%  Physical Exam Vitals and nursing note reviewed.  Constitutional:      General:  He is not in acute distress.    Appearance: Normal appearance. He is normal weight. He is not ill-appearing, toxic-appearing or diaphoretic.  HENT:     Head: Normocephalic and atraumatic.  Cardiovascular:     Rate and Rhythm: Normal rate.  Pulmonary:     Effort: Pulmonary effort is normal. No respiratory distress.  Abdominal:     General: Abdomen is flat.     Palpations: Abdomen is soft.     Tenderness: There is no abdominal tenderness.  Musculoskeletal:        General: Normal range of motion.     Cervical back: Normal range of motion.  Skin:    General: Skin is warm and dry.  Neurological:     General: No focal deficit present.     Mental Status: He is alert.  Psychiatric:        Mood and Affect: Mood normal.        Behavior: Behavior normal.     ED Results / Procedures / Treatments   Labs (all labs ordered are listed, but only abnormal results are displayed) Labs Reviewed  COMPREHENSIVE METABOLIC PANEL - Abnormal; Notable for the following components:      Result Value   CO2 20 (*)    Total Protein 8.8 (*)    Albumin 5.5 (*)    AST 57 (*)    ALT  65 (*)    Total Bilirubin 2.7 (*)    Anion gap 20 (*)    All other components within normal limits  CBC - Abnormal; Notable for the following components:   WBC 11.8 (*)    All other components within normal limits  LIPASE, BLOOD  URINALYSIS, ROUTINE W REFLEX MICROSCOPIC  RAPID URINE DRUG SCREEN, HOSP PERFORMED    EKG None  Radiology No results found.  Procedures Procedures  {Document cardiac monitor, telemetry assessment procedure when appropriate:1}  Medications Ordered in ED Medications  droperidol (INAPSINE) 2.5 MG/ML injection 2.5 mg (has no administration in time range)  lactated ringers bolus 1,000 mL (has no administration in time range)  ondansetron (ZOFRAN) injection 4 mg (4 mg Intravenous Given 06/16/23 1807)  sodium chloride 0.9 % bolus 1,000 mL (1,000 mLs Intravenous New Bag/Given 06/16/23 1806)   diazepam (VALIUM) injection 5 mg (5 mg Intravenous Given 06/16/23 1853)    ED Course/ Medical Decision Making/ A&P   {   Click here for ABCD2, HEART and other calculatorsREFRESH Note before signing :1}                          Medical Decision Making Amount and/or Complexity of Data Reviewed Labs: ordered.  Risk Prescription drug management.   This patient is a 27 y.o. male who presents to the ED for concern of nausea and vomiting, this involves an extensive number of treatment options, and is a complaint that carries with it a high risk of complications and morbidity. The emergent differential diagnosis prior to evaluation includes, but is not limited to, gastroparesis, DKA, gastroenteritis, cannabis hyperemesis syndrome. This is not an exhaustive differential.   Past Medical History / Co-morbidities / Social History: history of alcohol and marijuana abuse  Additional history: Chart reviewed. Pertinent results include: Seen several times previously for this, normally improved with fluids and medication.  Physical Exam: Physical exam performed. The pertinent findings include: Actively vomiting in the room, abdomen soft and nontender.  Lab Tests: I ordered, and personally interpreted labs.  The pertinent results include:  WBC 11.8, bicarb 20, anion gap 20.  Labs consistent with when patient normally presents with the symptoms.   Medications: I ordered medication including fluids and Zofran, droperidol, Valium for dehydration, nausea, vomiting. Reevaluation of the patient pending at shift change  Disposition: After consideration of the diagnostic results and the patients response to treatment, I feel that *** .   ***emergency department workup does not suggest an emergent condition requiring admission or immediate intervention beyond what has been performed at this time. The plan is: ***. The patient is safe for discharge and has been instructed to return immediately for worsening  symptoms, change in symptoms or any other concerns.  I discussed this case with my attending physician Dr. Marland Kitchen who cosigned this note including patient's presenting symptoms, physical exam, and planned diagnostics and interventions. Attending physician stated agreement with plan or made changes to plan which were implemented.     {Document critical care time when appropriate:1} {Document review of labs and clinical decision tools ie heart score, Chads2Vasc2 etc:1}  {Document your independent review of radiology images, and any outside records:1} {Document your discussion with family members, caretakers, and with consultants:1} {Document social determinants of health affecting pt's care:1} {Document your decision making why or why not admission, treatments were needed:1} Final Clinical Impression(s) / ED Diagnoses Final diagnoses:  None    Rx / DC Orders ED Discharge  Orders     None

## 2023-06-16 NOTE — Discharge Instructions (Addendum)
Seen today for abdominal pain with nausea and vomiting.  You are given medication to treat your symptoms.  Your blood work was reassuring, your liver enzyme tests are somewhat elevated but they have been like this in the past.  The other tests are also similar to your previous labs.  Your symptoms are likely related to your daily alcohol and marijuana use.  As we discussed stopping the substances should improve your symptoms.  Since you get shakes when he stopped drinking for more than a day or 2 I have prescribed you something called a Librium taper to prevent you from getting alcohol withdrawal symptoms.  You still need to follow-up with your primary care doctor as well as seek outpatient counseling for substance use.   It is extremely important that you do not drink alcohol when taking the Librium as this is very dangerous and could cause you to overdose unintentionally.  [facility] Primary Care Doctor List    Syliva Overman, MD. Specialty: [facility] Contact information: [address]  [place] [address]  [phone]   Lilyan Punt, MD. Specialty: [facility] Medicine Contact information: [address]  [place] [address]  [phone]   Avon Gully, MD Specialty: Internal Medicine Contact information: [address] [place] [address]  [phone]   Catalina Pizza, MD. Specialty: Internal Medicine Contact information: [address]  [place] [address]  [phone]    [facility] (Dr. Selena Batten) Specialty: Family Medicine Contact information: [address]  [place] [address]  [phone]   John Giovanni, MD. Specialty: [facility] Contact information: [address]  [address]  [place] [address]  [phone]   Carylon Perches, MD. Specialty: Internal Medicine Contact information: [address]  [address]  [place] [address]  [phone]    [facility]  [address] [place], [place] [address] [phone]  Services [facility] offers a variety of basic health services.  Services include but are not limited to: Blood pressure checks  Heart rate checks  Blood sugar checks  Urine analysis  Rapid strep tests  Pregnancy tests.  Health education and referrals  People needing more complex services will be directed to a physician online. Using these virtual visits, doctors can evaluate and prescribe medicine and treatments. There will be no medication on-site, though [facility] will help patients fill their prescriptions at little to no cost.   For More information please go to: [email or web address]

## 2023-06-16 NOTE — ED Notes (Signed)
Unable to obtain urine sample, at this time.

## 2023-06-17 ENCOUNTER — Encounter (HOSPITAL_COMMUNITY): Payer: Self-pay | Admitting: Emergency Medicine

## 2023-06-17 ENCOUNTER — Emergency Department (HOSPITAL_COMMUNITY)
Admission: EM | Admit: 2023-06-17 | Discharge: 2023-06-17 | Disposition: A | Discharge status: Home / Self Care | Payer: 59 | Attending: Emergency Medicine | Admitting: Emergency Medicine

## 2023-06-17 DIAGNOSIS — Y909 Presence of alcohol in blood, level not specified: Secondary | ICD-10-CM | POA: Diagnosis not present

## 2023-06-17 DIAGNOSIS — F1090 Alcohol use, unspecified, uncomplicated: Secondary | ICD-10-CM | POA: Insufficient documentation

## 2023-06-17 DIAGNOSIS — F1099 Alcohol use, unspecified with unspecified alcohol-induced disorder: Secondary | ICD-10-CM | POA: Diagnosis not present

## 2023-06-17 DIAGNOSIS — E876 Hypokalemia: Secondary | ICD-10-CM | POA: Insufficient documentation

## 2023-06-17 DIAGNOSIS — Z789 Other specified health status: Secondary | ICD-10-CM

## 2023-06-17 DIAGNOSIS — F1290 Cannabis use, unspecified, uncomplicated: Secondary | ICD-10-CM | POA: Insufficient documentation

## 2023-06-17 DIAGNOSIS — R112 Nausea with vomiting, unspecified: Secondary | ICD-10-CM | POA: Diagnosis not present

## 2023-06-17 DIAGNOSIS — R9431 Abnormal electrocardiogram [ECG] [EKG]: Secondary | ICD-10-CM | POA: Diagnosis not present

## 2023-06-17 LAB — URINALYSIS, ROUTINE W REFLEX MICROSCOPIC
Bacteria, UA: NONE SEEN
Bilirubin Urine: NEGATIVE
Glucose, UA: NEGATIVE mg/dL
Hgb urine dipstick: NEGATIVE
Ketones, ur: 80 mg/dL — AB
Leukocytes,Ua: NEGATIVE
Nitrite: NEGATIVE
Protein, ur: >=300 mg/dL — AB
Specific Gravity, Urine: 1.027 (ref 1.005–1.030)
pH: 6 (ref 5.0–8.0)

## 2023-06-17 LAB — COMPREHENSIVE METABOLIC PANEL
ALT: 57 U/L — ABNORMAL HIGH (ref 0–44)
AST: 49 U/L — ABNORMAL HIGH (ref 15–41)
Albumin: 5 g/dL (ref 3.5–5.0)
Alkaline Phosphatase: 71 U/L (ref 38–126)
Anion gap: 13 (ref 5–15)
BUN: 7 mg/dL (ref 6–20)
CO2: 27 mmol/L (ref 22–32)
Calcium: 9.6 mg/dL (ref 8.9–10.3)
Chloride: 93 mmol/L — ABNORMAL LOW (ref 98–111)
Creatinine, Ser: 0.78 mg/dL (ref 0.61–1.24)
GFR, Estimated: >60 mL/min (ref >60)
Glucose, Bld: 94 mg/dL (ref 70–99)
Potassium: 3.1 mmol/L — ABNORMAL LOW (ref 3.5–5.1)
Sodium: 133 mmol/L — ABNORMAL LOW (ref 135–145)
Total Bilirubin: 3.9 mg/dL — ABNORMAL HIGH (ref 0.3–1.2)
Total Protein: 8.3 g/dL — ABNORMAL HIGH (ref 6.5–8.1)

## 2023-06-17 LAB — LIPASE, BLOOD: Lipase: 26 U/L (ref 11–51)

## 2023-06-17 LAB — CBC
HCT: 47.9 % (ref 39.0–52.0)
Hemoglobin: 16.5 g/dL (ref 13.0–17.0)
MCH: 32.7 pg (ref 26.0–34.0)
MCHC: 34.4 g/dL (ref 30.0–36.0)
MCV: 94.9 fL (ref 80.0–100.0)
Platelets: 252 10*3/uL (ref 150–400)
RBC: 5.05 MIL/uL (ref 4.22–5.81)
RDW: 13.7 % (ref 11.5–15.5)
WBC: 9.2 10*3/uL (ref 4.0–10.5)
nRBC: 0 % (ref 0.0–0.2)

## 2023-06-17 MED ORDER — DROPERIDOL 2.5 MG/ML IJ SOLN
2.5000 mg | Freq: Once | INTRAMUSCULAR | Status: AC
  Administered 2023-06-17: 2.5 mg via INTRAVENOUS
  Filled 2023-06-17: qty 2

## 2023-06-17 MED ORDER — ONDANSETRON 4 MG PO TBDP: 4.0000 mg | ORAL_TABLET | Freq: Three times a day (TID) | ORAL | 0 refills | Status: DC | PRN

## 2023-06-17 MED ORDER — DIAZEPAM 5 MG/ML IJ SOLN
5.0000 mg | Freq: Once | INTRAMUSCULAR | Status: AC
  Administered 2023-06-17: 5 mg via INTRAVENOUS
  Filled 2023-06-17: qty 2

## 2023-06-17 MED ORDER — POTASSIUM CHLORIDE CRYS ER 20 MEQ PO TBCR
40.0000 meq | EXTENDED_RELEASE_TABLET | Freq: Once | ORAL | Status: AC
  Administered 2023-06-17: 40 meq via ORAL
  Filled 2023-06-17: qty 2

## 2023-06-17 MED ORDER — PROMETHAZINE HCL 25 MG RE SUPP: 25.0000 mg | Freq: Four times a day (QID) | RECTAL | 0 refills | Status: DC | PRN

## 2023-06-17 MED ORDER — SODIUM CHLORIDE 0.9 % IV BOLUS
1000.0000 mL | Freq: Once | INTRAVENOUS | Status: AC
  Administered 2023-06-17: 1000 mL via INTRAVENOUS

## 2023-06-17 NOTE — ED Provider Notes (Signed)
[place] EMERGENCY DEPARTMENT AT [facility] Provider Note   CSN: [id number] Arrival date & time: 06/17/23  1034     History  Chief Complaint  Patient presents with   Emesis   Abdominal Pain    Timothy [name] is a 27 y.o. male.  Patient with history of alcohol and marijuana abuse returns today with complaints of nausea and vomiting. Patient was here yesterday with same and had benign work-up, was given medications with symptomatic resolution and was discharged home.  He states that he did well throughout the afternoon yesterday, however woke up this morning and began vomiting again.  He did not fill the prescription for the medications he was sent home with yesterday.  Does note that he continues to drink alcohol and smoke marijuana every day.  He has been seen here several times and diagnosed with cannabis hyperemesis syndrome.  States that this feels the same. Has been having regular bowel movements without diarrhea.  No fevers or chills or sick contacts.  The history is provided by the patient. No language interpreter was used.  Emesis Abdominal Pain Associated symptoms: nausea and vomiting        Home Medications Prior to Admission medications   Medication Sig Start Date End Date Taking? Authorizing Provider  chlordiazePOXIDE (LIBRIUM) 25 MG capsule 50mg  PO TID x 1D, then 25-50mg  PO BID X 1D, then 25-50mg  PO QD X 1D 06/16/23   Beatty, Celeste A, PA-C  omeprazole (PRILOSEC) 20 MG capsule Take 1 capsule (20 mg total) by mouth daily. 08/01/22   Triplett, Tammy, PA-C  ondansetron (ZOFRAN-ODT) 4 MG disintegrating tablet Take 1 tablet (4 mg total) by mouth every 8 (eight) hours as needed for nausea or vomiting. 05/19/23   Sabas Sous, MD  pantoprazole (PROTONIX) 40 MG tablet Take 1 tablet (40 mg total) by mouth daily. 06/16/23   Carmel Sacramento A, PA-C  promethazine (PHENERGAN) 25 MG tablet Take 1 tablet (25 mg total) by mouth every 6 (six) hours as needed for nausea or  vomiting. 05/19/23   Sabas Sous, MD      Allergies    Amoxil [amoxicillin]    Review of Systems   Review of Systems  Gastrointestinal:  Positive for nausea and vomiting.  All other systems reviewed and are negative.   Physical Exam Updated Vital Signs BP 127/74   Pulse 89   Temp 98.7 F (37.1 C) (Oral)   Resp 14   Ht 5\' 4"  (1.626 m)   Wt 72 kg   SpO2 95%   BMI 27.25 kg/m  Physical Exam Vitals and nursing note reviewed.  Constitutional:      General: He is not in acute distress.    Appearance: Normal appearance. He is normal weight. He is not ill-appearing, toxic-appearing or diaphoretic.  HENT:     Head: Normocephalic and atraumatic.  Cardiovascular:     Rate and Rhythm: Normal rate.  Pulmonary:     Effort: Pulmonary effort is normal. No respiratory distress.  Abdominal:     General: Abdomen is flat.     Palpations: Abdomen is soft.     Tenderness: There is no abdominal tenderness.  Musculoskeletal:        General: Normal range of motion.     Cervical back: Normal range of motion.  Skin:    General: Skin is warm and dry.  Neurological:     General: No focal deficit present.     Mental Status: He is alert.  Psychiatric:        Mood and Affect: Mood normal.        Behavior: Behavior normal.     ED Results / Procedures / Treatments   Labs (all labs ordered are listed, but only abnormal results are displayed) Labs Reviewed  COMPREHENSIVE METABOLIC PANEL - Abnormal; Notable for the following components:      Result Value   Sodium 133 (*)    Potassium 3.1 (*)    Chloride 93 (*)    Total Protein 8.3 (*)    AST 49 (*)    ALT 57 (*)    Total Bilirubin 3.9 (*)    All other components within normal limits  URINALYSIS, ROUTINE W REFLEX MICROSCOPIC - Abnormal; Notable for the following components:   Color, Urine AMBER (*)    Ketones, ur 80 (*)    Protein, ur >=300 (*)    All other components within normal limits  LIPASE, BLOOD  CBC     EKG None  Radiology No results found.  Procedures Procedures    Medications Ordered in ED Medications  potassium chloride SA (KLOR-CON M) CR tablet 40 mEq (has no administration in time range)  droperidol (INAPSINE) 2.5 MG/ML injection 2.5 mg (2.5 mg Intravenous Given 06/17/23 1315)  diazepam (VALIUM) injection 5 mg (5 mg Intravenous Given 06/17/23 1315)  sodium chloride 0.9 % bolus 1,000 mL (1,000 mLs Intravenous New Bag/Given 06/17/23 1316)    ED Course/ Medical Decision Making/ A&P                             Medical Decision Making Amount and/or Complexity of Data Reviewed Labs: ordered.  Risk Prescription drug management.   This patient is a 27 y.o. male who presents to the ED for concern of nausea and vomiting, this involves an extensive number of treatment options, and is a complaint that carries with it a high risk of complications and morbidity. The emergent differential diagnosis prior to evaluation includes, but is not limited to,  gastroparesis, DKA, gastroenteritis, cannabis hyperemesis syndrome.  . This is not an exhaustive differential.   Past Medical History / Co-morbidities / Social History: history of alcohol and marijuana abuse   Additional history: Chart reviewed. Pertinent results include: Seen by me here yesterday, given fluids and antiemetics at which point he felt better and was discharged home.  Patient did not fill medications he was sent home with  Physical Exam: Physical exam performed. The pertinent findings include: Abdomen soft and nontender.  Lab Tests: I ordered, and personally interpreted labs.  The pertinent results include:  Na 133, K 3.1 down from 139 and 3.1 yesterday. Bicarb and anion gap WNL. UA with ketones and protein.    Cardiac Monitoring:  The patient was maintained on a cardiac monitor.  My attending physician Dr. Estell Harpin viewed and interpreted the cardiac monitored which showed an underlying rhythm of: sinus rhythm, no QTc  prolongation. I agree with this interpretation.   Medications: I ordered medication including fluids, droperidol, Valium for dehydration, nausea, and vomiting. Reevaluation of the patient after these medicines showed that the patient resolved. I have reviewed the patients home medicines and have made adjustments as needed.   Disposition: After consideration of the diagnostic results and the patients response to treatment, I feel that emergency department workup does not suggest an emergent condition requiring admission or immediate intervention beyond what has been performed at this time. The plan is: Discharge  with antiemetics and close follow-up.  After medication, patient is able to eat and drink without any residual episodes of nausea or vomiting.  Given that this is his second visit in 24 hours for similar symptoms I did offer admission which he declined. Evaluation and diagnostic testing in the emergency department does not suggest an emergent condition requiring admission or immediate intervention beyond what has been performed at this time.  Plan for discharge with close PCP follow-up.  Patient is understanding and amenable with plan, educated on red flag symptoms that would prompt immediate return.  Patient discharged in stable condition.  Final Clinical Impression(s) / ED Diagnoses Final diagnoses:  Nausea and vomiting, unspecified vomiting type  Cannabis use disorder  Alcohol use  Hypokalemia due to excessive gastrointestinal loss of potassium    Rx / DC Orders ED Discharge Orders          Ordered    ondansetron (ZOFRAN-ODT) 4 MG disintegrating tablet  Every 8 hours PRN        06/17/23 1601    promethazine (PHENERGAN) 25 MG suppository  Every 6 hours PRN        06/17/23 1601          An After Visit Summary was printed and given to the patient.     Vear Clock 06/17/23 Thayer Dallas, MD 06/20/23 [phone]

## 2023-06-17 NOTE — Discharge Instructions (Addendum)
As we discussed, given this is your second visit in 24 hours I did offer to admit you to the hospital to have additional fluid rehydration and antinausea medication.  However you have declined admission and would prefer to go home which is reasonable given that you are able to eat and drink without any residual nausea or vomiting.  I have given you a prescription for Zofran and Phenergan which are 2 antinausea medications which she can take as prescribed as needed for management for any residual nausea/vomiting.  Please fill this prescription.  Your potassium was low today due to all of the vomiting you have been doing for the last 2 days.  Please drink plenty of fluids high in electrolytes to help replenish this.  Additionally, the provider that saw you yesterday gave you Librium to help you not go through withdrawal when you stop drinking alcohol.  Do not take this medication if you plan to continue drinking alcohol.  Return if development of any new or worsening symptoms.

## 2023-06-17 NOTE — ED Triage Notes (Signed)
Pt states he drank too much liquor a few nights ago. Denies any alcohol yesterday or today.

## 2023-06-17 NOTE — ED Triage Notes (Signed)
PT presents with n/v x 2 days. Was seen in this ER yesterday for same. Vomiting a lot this morning with lower abd cramping. Denies any fevers, denies diarrhea.

## 2023-06-17 NOTE — ED Notes (Signed)
Pt reminded that staff is needing UA

## 2023-06-18 ENCOUNTER — Emergency Department (HOSPITAL_COMMUNITY)
Admission: EM | Admit: 2023-06-18 | Discharge: 2023-06-18 | Disposition: A | Discharge status: Home / Self Care | Payer: 59 | Attending: Emergency Medicine | Admitting: Emergency Medicine

## 2023-06-18 ENCOUNTER — Encounter (HOSPITAL_COMMUNITY): Payer: Self-pay | Admitting: Emergency Medicine

## 2023-06-18 ENCOUNTER — Other Ambulatory Visit: Payer: Self-pay

## 2023-06-18 DIAGNOSIS — F1290 Cannabis use, unspecified, uncomplicated: Secondary | ICD-10-CM | POA: Insufficient documentation

## 2023-06-18 DIAGNOSIS — R112 Nausea with vomiting, unspecified: Secondary | ICD-10-CM | POA: Diagnosis not present

## 2023-06-18 DIAGNOSIS — F12188 Cannabis abuse with other cannabis-induced disorder: Secondary | ICD-10-CM | POA: Diagnosis not present

## 2023-06-18 MED ORDER — DROPERIDOL 2.5 MG/ML IJ SOLN
2.5000 mg | Freq: Once | INTRAMUSCULAR | Status: AC
  Administered 2023-06-18: 2.5 mg via INTRAVENOUS
  Filled 2023-06-18: qty 2

## 2023-06-18 MED ORDER — SODIUM CHLORIDE 0.9 % IV BOLUS
1000.0000 mL | Freq: Once | INTRAVENOUS | Status: AC
  Administered 2023-06-18: 1000 mL via INTRAVENOUS

## 2023-06-18 NOTE — ED Notes (Signed)
Gave pt sprite to drink 

## 2023-06-18 NOTE — ED Triage Notes (Signed)
Pt states he has vomited 10-15 times in the last 2hrs. Denies recent Marijuana use.

## 2023-06-18 NOTE — Discharge Instructions (Signed)
You were seen today for recurrent emesis.  This is still likely very much associated with alcohol and marijuana use.  You should stop smoking marijuana.  It is imperative that you get your medications filled.  Make sure that you are drinking plenty of fluids.

## 2023-06-18 NOTE — ED Provider Notes (Signed)
[place] EMERGENCY DEPARTMENT AT [facility] Provider Note   CSN: [id number] Arrival date & time: 06/18/23  0030     History  Chief Complaint  Patient presents with   Emesis    Timothy [name] is a 27 y.o. male.  HPI      This is a 27 year old male who presents with vomiting.  Patient has been seen and evaluated twice in the last 2 days for the same.  Has a history of cyclic vomiting versus cannabinoid hyperemesis.  Presented similarly after having alcohol and smoking marijuana on Friday.  His labs previously have been reassuring.  He has not been able to get his medications filled as an outpatient.  Patient states that he left yesterday and after getting home began to have multiple episodes of nonbilious, nonbloody emesis.  Denies abdominal pain.  Denies change in bowels.  No fevers.  Adamantly denies any marijuana or alcohol since Friday night. Home Medications Prior to Admission medications   Medication Sig Start Date End Date Taking? Authorizing Provider  chlordiazePOXIDE (LIBRIUM) 25 MG capsule 50mg  PO TID x 1D, then 25-50mg  PO BID X 1D, then 25-50mg  PO QD X 1D 06/16/23   Beatty, Celeste A, PA-C  omeprazole (PRILOSEC) 20 MG capsule Take 1 capsule (20 mg total) by mouth daily. 08/01/22   Triplett, Tammy, PA-C  ondansetron (ZOFRAN-ODT) 4 MG disintegrating tablet Take 1 tablet (4 mg total) by mouth every 8 (eight) hours as needed for nausea or vomiting. 06/17/23   Smoot, Shawn Route, PA-C  pantoprazole (PROTONIX) 40 MG tablet Take 1 tablet (40 mg total) by mouth daily. 06/16/23   Carmel Sacramento A, PA-C  promethazine (PHENERGAN) 25 MG suppository Place 1 suppository (25 mg total) rectally every 6 (six) hours as needed for nausea or vomiting. 06/17/23   Smoot, Shawn Route, PA-C      Allergies    Amoxil [amoxicillin]    Review of Systems   Review of Systems  Constitutional:  Negative for fever.  Respiratory:  Negative for shortness of breath.   Cardiovascular:  Negative for chest  pain.  Gastrointestinal:  Positive for nausea and vomiting. Negative for constipation and diarrhea.  All other systems reviewed and are negative.   Physical Exam Updated Vital Signs BP (!) 178/83 (BP Location: Left Arm)   Pulse 68   Temp 98.4 F (36.9 C) (Oral)   Resp 16   Ht 1.626 m (5\' 4" )   Wt 72 kg   SpO2 97%   BMI 27.25 kg/m  Physical Exam Vitals and nursing note reviewed.  Constitutional:      Appearance: He is well-developed. He is not ill-appearing.  HENT:     Head: Normocephalic and atraumatic.     Mouth/Throat:     Mouth: Mucous membranes are dry.  Eyes:     Pupils: Pupils are equal, round, and reactive to light.  Cardiovascular:     Rate and Rhythm: Normal rate and regular rhythm.     Heart sounds: Normal heart sounds. No murmur heard. Pulmonary:     Effort: Pulmonary effort is normal. No respiratory distress.     Breath sounds: Normal breath sounds. No wheezing.  Abdominal:     General: Bowel sounds are normal.     Palpations: Abdomen is soft.     Tenderness: There is no abdominal tenderness. There is no rebound.  Musculoskeletal:     Cervical back: Neck supple.  Lymphadenopathy:     Cervical: No cervical adenopathy.  Skin:  General: Skin is warm and dry.  Neurological:     Mental Status: He is alert and oriented to person, place, and time.  Psychiatric:        Mood and Affect: Mood normal.     ED Results / Procedures / Treatments   Labs (all labs ordered are listed, but only abnormal results are displayed) Labs Reviewed - No data to display  EKG None  Radiology No results found.  Procedures Procedures    Medications Ordered in ED Medications  sodium chloride 0.9 % bolus 1,000 mL (1,000 mLs Intravenous New Bag/Given 06/18/23 0131)  droperidol (INAPSINE) 2.5 MG/ML injection 2.5 mg (2.5 mg Intravenous Given 06/18/23 0131)    ED Course/ Medical Decision Making/ A&P                             Medical Decision  Making Risk Prescription drug management.   This patient presents to the ED for concern of persistent nausea and vomiting, this involves an extensive number of treatment options, and is a complaint that carries with it a high risk of complications and morbidity.  I considered the following differential and admission for this acute, potentially life threatening condition.  The differential diagnosis includes recurrent hyperemesis, cyclical vomiting, less likely obstruction  MDM:    This is a 27 year old male who presents with recurrent emesis.  This is his third visit.  He has yet to have his medications filled.  He is overall nontoxic.  I have reviewed his lab work from less than 12 hours ago.  It did indicate some dehydration with ketones in his urine.  Patient was given fluids and droperidol.  He ultimately was able to tolerate fluids.  Do not feel he needs repeat workup at this time as his exam, history and physical exam findings are consistent with ongoing issues with hyperemesis.  I implored him to stop drinking alcohol and smoking marijuana.  He also needs to get his prescriptions filled to manage his symptoms at home.  (Labs, imaging, consults)  Labs: I Ordered, and personally interpreted labs.  The pertinent results include: None  Imaging Studies ordered: I ordered imaging studies including none I independently visualized and interpreted imaging. I agree with the radiologist interpretation  Additional history obtained from chart review.  External records from outside source obtained and reviewed includin prior evaluations   Cardiac Monitoring: The patient was maintained on a cardiac monitor.  If on the cardiac monitor, I personally viewed and interpreted the cardiac monitored which showed an underlying rhythm of: Sinus rhythm  Reevaluation: After the interventions noted above, I reevaluated the patient and found that they have :improved  Social Determinants of Health:  lives  independently, polysubstance abuse  Disposition: Discharge  Co morbidities that complicate the patient evaluation  Past Medical History:  Diagnosis Date   Cannabinoid hyperemesis syndrome    Febrile seizures (HCC)    last at 89 y of age     Medicines Meds ordered this encounter  Medications   sodium chloride 0.9 % bolus 1,000 mL   droperidol (INAPSINE) 2.5 MG/ML injection 2.5 mg    I have reviewed the patients home medicines and have made adjustments as needed  Problem List / ED Course: Problem List Items Addressed This Visit   None Visit Diagnoses     Cannabinoid hyperemesis syndrome    -  Primary  Final Clinical Impression(s) / ED Diagnoses Final diagnoses:  Cannabinoid hyperemesis syndrome    Rx / DC Orders ED Discharge Orders     None         Verl Kitson, Mayer Masker, MD 06/18/23 [phone]

## 2023-07-12 ENCOUNTER — Emergency Department (HOSPITAL_COMMUNITY)
Admission: EM | Admit: 2023-07-12 | Discharge: 2023-07-13 | Disposition: A | Discharge status: Home / Self Care | Payer: 59 | Attending: Emergency Medicine | Admitting: Emergency Medicine

## 2023-07-12 ENCOUNTER — Other Ambulatory Visit: Payer: Self-pay

## 2023-07-12 ENCOUNTER — Encounter (HOSPITAL_COMMUNITY): Payer: Self-pay

## 2023-07-12 DIAGNOSIS — R112 Nausea with vomiting, unspecified: Secondary | ICD-10-CM | POA: Insufficient documentation

## 2023-07-12 DIAGNOSIS — Z8659 Personal history of other mental and behavioral disorders: Secondary | ICD-10-CM | POA: Insufficient documentation

## 2023-07-12 DIAGNOSIS — R531 Weakness: Secondary | ICD-10-CM | POA: Insufficient documentation

## 2023-07-12 DIAGNOSIS — R197 Diarrhea, unspecified: Secondary | ICD-10-CM | POA: Insufficient documentation

## 2023-07-12 DIAGNOSIS — R5383 Other fatigue: Secondary | ICD-10-CM | POA: Diagnosis not present

## 2023-07-12 DIAGNOSIS — R519 Headache, unspecified: Secondary | ICD-10-CM | POA: Diagnosis not present

## 2023-07-12 DIAGNOSIS — F1011 Alcohol abuse, in remission: Secondary | ICD-10-CM

## 2023-07-12 DIAGNOSIS — F1010 Alcohol abuse, uncomplicated: Secondary | ICD-10-CM | POA: Diagnosis not present

## 2023-07-12 LAB — CBC
HCT: 49.2 % (ref 39.0–52.0)
Hemoglobin: 16.8 g/dL (ref 13.0–17.0)
MCH: 32.5 pg (ref 26.0–34.0)
MCHC: 34.1 g/dL (ref 30.0–36.0)
MCV: 95.2 fL (ref 80.0–100.0)
Platelets: 252 10*3/uL (ref 150–400)
RBC: 5.17 MIL/uL (ref 4.22–5.81)
RDW: 14.3 % (ref 11.5–15.5)
WBC: 9.2 10*3/uL (ref 4.0–10.5)
nRBC: 0 % (ref 0.0–0.2)

## 2023-07-12 LAB — COMPREHENSIVE METABOLIC PANEL
ALT: 70 U/L — ABNORMAL HIGH (ref 0–44)
AST: 54 U/L — ABNORMAL HIGH (ref 15–41)
Albumin: 5.3 g/dL — ABNORMAL HIGH (ref 3.5–5.0)
Alkaline Phosphatase: 82 U/L (ref 38–126)
Anion gap: 16 — ABNORMAL HIGH (ref 5–15)
BUN: 7 mg/dL (ref 6–20)
CO2: 23 mmol/L (ref 22–32)
Calcium: 10.1 mg/dL (ref 8.9–10.3)
Chloride: 100 mmol/L (ref 98–111)
Creatinine, Ser: 0.77 mg/dL (ref 0.61–1.24)
GFR, Estimated: >60 mL/min (ref >60)
Glucose, Bld: 107 mg/dL — ABNORMAL HIGH (ref 70–99)
Potassium: 3.7 mmol/L (ref 3.5–5.1)
Sodium: 139 mmol/L (ref 135–145)
Total Bilirubin: 2.5 mg/dL — ABNORMAL HIGH (ref 0.3–1.2)
Total Protein: 8.6 g/dL — ABNORMAL HIGH (ref 6.5–8.1)

## 2023-07-12 LAB — LIPASE, BLOOD: Lipase: 25 U/L (ref 11–51)

## 2023-07-12 MED ORDER — THIAMINE MONONITRATE 100 MG PO TABS: 100.0000 mg | ORAL_TABLET | Freq: Every day | ORAL | Status: DC

## 2023-07-12 MED ORDER — SODIUM CHLORIDE 0.9 % IV BOLUS
1000.0000 mL | Freq: Once | INTRAVENOUS | Status: AC
  Administered 2023-07-12: 1000 mL via INTRAVENOUS

## 2023-07-12 MED ORDER — ONDANSETRON HCL 4 MG PO TABS: 4.0000 mg | ORAL_TABLET | Freq: Four times a day (QID) | ORAL | 0 refills | Status: DC

## 2023-07-12 MED ORDER — LORAZEPAM 1 MG PO TABS: 0.0000 mg | ORAL_TABLET | Freq: Four times a day (QID) | ORAL | Status: DC

## 2023-07-12 MED ORDER — METOCLOPRAMIDE HCL 5 MG/ML IJ SOLN
10.0000 mg | Freq: Once | INTRAMUSCULAR | Status: AC
  Administered 2023-07-12: 10 mg via INTRAVENOUS
  Filled 2023-07-12: qty 2

## 2023-07-12 MED ORDER — THIAMINE HCL 100 MG/ML IJ SOLN
100.0000 mg | Freq: Every day | INTRAMUSCULAR | Status: DC
  Administered 2023-07-12: 100 mg via INTRAVENOUS
  Filled 2023-07-12: qty 2

## 2023-07-12 MED ORDER — ONDANSETRON 4 MG PO TBDP
ORAL_TABLET | ORAL | Status: AC
  Administered 2023-07-12: 4 mg via ORAL
  Filled 2023-07-12: qty 1

## 2023-07-12 MED ORDER — DROPERIDOL 2.5 MG/ML IJ SOLN
2.5000 mg | Freq: Once | INTRAMUSCULAR | Status: AC
  Administered 2023-07-12: 2.5 mg via INTRAVENOUS
  Filled 2023-07-12: qty 2

## 2023-07-12 MED ORDER — ONDANSETRON 4 MG PO TBDP
4.0000 mg | ORAL_TABLET | Freq: Once | ORAL | Status: AC | PRN
  Administered 2023-07-12: 4 mg via ORAL

## 2023-07-12 MED ORDER — ONDANSETRON HCL 4 MG/2ML IJ SOLN
4.0000 mg | Freq: Once | INTRAMUSCULAR | Status: AC | PRN
  Administered 2023-07-12: 4 mg via INTRAVENOUS
  Filled 2023-07-12: qty 2

## 2023-07-12 MED ORDER — LORAZEPAM 2 MG/ML IJ SOLN
0.0000 mg | Freq: Four times a day (QID) | INTRAMUSCULAR | Status: DC
  Administered 2023-07-12: 2 mg via INTRAVENOUS
  Filled 2023-07-12: qty 1

## 2023-07-12 NOTE — ED Notes (Signed)
Pt has had two large episodes of liquid emesis. Pt has obvious tremors not. Afebrile. PA made aware.   Pt states he has not had any alcohol to drink today where he normally drinks 5-6 beers a day. CIWA done.

## 2023-07-12 NOTE — ED Notes (Signed)
Pt asleep at this time. Even rise and fall of chest. Call bell within reach

## 2023-07-12 NOTE — ED Notes (Signed)
Pt up and ambulated in hallway. Pt complains of slight dizziness. Pt sitting up in chair. Provided with crackers and soda

## 2023-07-12 NOTE — ED Notes (Signed)
Pt awake and requested gingerale. EDP approved. No other request at this time. Call bell in reach.

## 2023-07-12 NOTE — ED Notes (Signed)
Pt has vomited continuously while waiting for a room. IV established and IV zofran administered.

## 2023-07-12 NOTE — ED Provider Notes (Signed)
   Patient signed out to me by Timothy Amor, PA-C pending reassessment   Patient here for persistent vomiting.  Admits to daily alcohol use.  Had initial CIWA of 14, multiple episodes of vomiting here in the ER.  He he was given IV fluids, droperidol, Ativan, Reglan, Zofran  As multiple ER visits for same.  See previous provider note for complete H&P.  Signed out to me for reevaluation after completion of his IV fluids and reassessment  He has tolerated oral fluid challenge, states his nausea and vomiting have improved.  He was initially somnolent after receiving the Ativan, this has improved over time he is now more alert.  Repeat CIWA is 1  Patient reports feeling much better, tolerated crackers and oral fluids without further vomiting.  He is ambulated in the department.  He still seems somnolent and states he does not have anyone that he can contact to drive him home.  Plan for further observation and discharge home when he is more alert.   Discussed plan with overnight provider, Dr. Oletta Cohn agrees to reevaluate patient   Rosey Bath 07/12/23 2334    Benjiman Core, MD 07/13/23 [phone]

## 2023-07-12 NOTE — ED Triage Notes (Signed)
Pt woke up vomiting at 0800 this morning and has vomited approximately 10 times. Pt unable to keep down fluids. Pt denies nausea, diarrhea, or fever.

## 2023-07-12 NOTE — ED Notes (Signed)
Pt still vomiting after OTD zofran. IV zofran ordered as standing order.

## 2023-07-12 NOTE — ED Provider Notes (Signed)
[place] [facility] DEPARTMENT AT [facility] Provider Note   CSN: [id number] Arrival date & time: 07/12/23  1205     History  Chief Complaint  Patient presents with   Emesis    Timothy [name] is a 27 y.o. male with a history including alcohol abuse, marijuana use, cannabinoid hyperemesis syndrome presenting for evaluation of nausea and uncontrolled vomiting which started this morning.  He does endorse smoking marijuana last night and also had 6 beers, he states he drinks similar quantities most days.  He suspects his symptoms are predominantly from the alcohol.  He has had approximately 10 episodes of nonbloody emesis since he woke at 8:00 this morning.  He has attempted to eat and drink but has been unable to keep any p.o. intake down.  He denies diarrhea, denies fevers.  He does endorse generalized fatigue, weakness and has a mild headache.  Of note he was given Zofran in triage prior to my evaluation, he continues to actively vomit during my exam.  The history is provided by the patient.       Home Medications Prior to Admission medications   Medication Sig Start Date End Date Taking? Authorizing Provider  chlordiazePOXIDE (LIBRIUM) 25 MG capsule 50mg  PO TID x 1D, then 25-50mg  PO BID X 1D, then 25-50mg  PO QD X 1D 06/16/23   Beatty, Celeste A, PA-C  omeprazole (PRILOSEC) 20 MG capsule Take 1 capsule (20 mg total) by mouth daily. 08/01/22   Triplett, Tammy, PA-C  ondansetron (ZOFRAN-ODT) 4 MG disintegrating tablet Take 1 tablet (4 mg total) by mouth every 8 (eight) hours as needed for nausea or vomiting. 06/17/23   Smoot, Shawn Route, PA-C  pantoprazole (PROTONIX) 40 MG tablet Take 1 tablet (40 mg total) by mouth daily. 06/16/23   Carmel Sacramento A, PA-C  promethazine (PHENERGAN) 25 MG suppository Place 1 suppository (25 mg total) rectally every 6 (six) hours as needed for nausea or vomiting. 06/17/23   Smoot, Shawn Route, PA-C      Allergies    Amoxil [amoxicillin]    Review of  Systems   Review of Systems  Constitutional:  Positive for fatigue. Negative for fever.  HENT:  Negative for congestion and sore throat.   Eyes: Negative.   Respiratory:  Negative for chest tightness and shortness of breath.   Cardiovascular:  Negative for chest pain.  Gastrointestinal:  Positive for nausea and vomiting. Negative for abdominal pain.  Genitourinary: Negative.   Musculoskeletal:  Negative for arthralgias, joint swelling and neck pain.  Skin: Negative.  Negative for rash and wound.  Neurological:  Positive for weakness. Negative for dizziness, light-headedness, numbness and headaches.  Psychiatric/Behavioral: Negative.      Physical Exam Updated Vital Signs BP 131/77   Pulse 80   Temp 98.5 F (36.9 C) (Oral)   Resp 16   Ht 5\' 4"  (1.626 m)   Wt 72 kg   SpO2 99%   BMI 27.25 kg/m  Physical Exam Vitals and nursing note reviewed.  Constitutional:      Appearance: He is well-developed.  HENT:     Head: Normocephalic and atraumatic.     Mouth/Throat:     Mouth: Mucous membranes are dry.  Eyes:     Conjunctiva/sclera: Conjunctivae normal.  Cardiovascular:     Rate and Rhythm: Normal rate and regular rhythm.     Heart sounds: Normal heart sounds.  Pulmonary:     Effort: Pulmonary effort is normal.     Breath sounds: Normal  breath sounds. No wheezing.  Abdominal:     General: Bowel sounds are normal. There is no distension.     Palpations: Abdomen is soft.     Tenderness: There is no abdominal tenderness. There is no guarding.  Musculoskeletal:        General: Normal range of motion.     Cervical back: Normal range of motion.  Skin:    General: Skin is warm and dry.     Coloration: Skin is not jaundiced.  Neurological:     General: No focal deficit present.     Mental Status: He is alert.     ED Results / Procedures / Treatments   Labs (all labs ordered are listed, but only abnormal results are displayed) Labs Reviewed  COMPREHENSIVE METABOLIC PANEL  - Abnormal; Notable for the following components:      Result Value   Glucose, Bld 107 (*)    Total Protein 8.6 (*)    Albumin 5.3 (*)    AST 54 (*)    ALT 70 (*)    Total Bilirubin 2.5 (*)    Anion gap 16 (*)    All other components within normal limits  LIPASE, BLOOD  CBC  URINALYSIS, ROUTINE W REFLEX MICROSCOPIC    EKG None  Radiology No results found.  Procedures Procedures    Medications Ordered in ED Medications  thiamine (VITAMIN B1) tablet 100 mg ( Oral See Alternative 07/12/23 1632)    Or  thiamine (VITAMIN B1) injection 100 mg (100 mg Intravenous Given 07/12/23 1632)  LORazepam (ATIVAN) injection 0-4 mg (2 mg Intravenous Given 07/12/23 1631)    Or  LORazepam (ATIVAN) tablet 0-4 mg ( Oral See Alternative 07/12/23 1631)  ondansetron (ZOFRAN-ODT) disintegrating tablet 4 mg (4 mg Oral Given 07/12/23 1242)  ondansetron (ZOFRAN) injection 4 mg (4 mg Intravenous Given 07/12/23 1334)  sodium chloride 0.9 % bolus 1,000 mL (1,000 mLs Intravenous Bolus 07/12/23 1502)  droperidol (INAPSINE) 2.5 MG/ML injection 2.5 mg (2.5 mg Intravenous Given 07/12/23 1502)  metoCLOPramide (REGLAN) injection 10 mg (10 mg Intravenous Given 07/12/23 1631)  sodium chloride 0.9 % bolus 1,000 mL (1,000 mLs Intravenous Bolus 07/12/23 1728)    ED Course/ Medical Decision Making/ A&P                                 Medical Decision Making Patient presenting with nausea and vomiting, differential diagnosis including cannabinoid hyperemesis syndrome versus EtOH withdrawal, gastritis, cholecystitis, pancreatitis.  He was initially given Zofran per IV after ODT Zofran was not helpful in triage.  He was also given IV fluids.  He continued to vomit, he has had droperidol in the past which has been helpful, this was given after which she continued to vomit copiously.  Reglan was ordered, he developed a mild tremor, CIWA score was obtained at 14, Ativan given.  At reassessment, pt now very drowsy - additional IV  fluids ordered.  He will need CIWA reassessment once IV fluids completed and pt awake.  Pending on how he feels and ciwa score, he may be stable for dc home.   Discussed with Pauline Aus PA-C who assumes care.  Amount and/or Complexity of Data Reviewed Labs: ordered.    Details: Labs are significant for elevated transaminases with an AST of 54 and an ALT of 70, these labs are consistent with other prior LFTs.  He also has an elevated bilirubin at 2.5 which  is also a normal finding for him.  His white count is normal at 9.2, lipase normal at 25.  Risk OTC drugs. Prescription drug management.           Final Clinical Impression(s) / ED Diagnoses Final diagnoses:  Nausea vomiting and diarrhea  H/O ETOH abuse    Rx / DC Orders ED Discharge Orders     None         Victoriano Lain 07/12/23 1906    Benjiman Core, MD 07/13/23 1451

## 2023-07-12 NOTE — ED Notes (Signed)
In patient room to obtain CIWA and pt sleeping even rise and fall of chest. Vitals WNL. Call bell in reach. EDP aware pt sleeping and to do CIWA when pt wakes.

## 2023-07-12 NOTE — Discharge Instructions (Addendum)
Your workup today was overall reassuring.  You have been prescribed medications to help with your nausea vomiting if needed.  I have provided information for one of the local primary care clinics that you may contact to establish primary care.  Please return to the emergency department for any new or worsening symptoms.

## 2023-07-13 DIAGNOSIS — R112 Nausea with vomiting, unspecified: Secondary | ICD-10-CM | POA: Diagnosis not present

## 2023-07-13 NOTE — ED Notes (Signed)
Patient verbalizes understanding of discharge instructions. Opportunity for questioning and answers were provided. Armband removed by staff, pt discharged from ED. Ambulated out to ED

## 2023-07-13 NOTE — ED Notes (Signed)
Pt given a gingerale

## 2023-07-23 IMAGING — DX DG FINGER INDEX 2+V*R*
3 series · 3 of 3 positions shown · non-contrast
Comparison: None.

CLINICAL DATA: Right index finger pain

EXAM:
RIGHT INDEX FINGER 2+V

[finger ap]
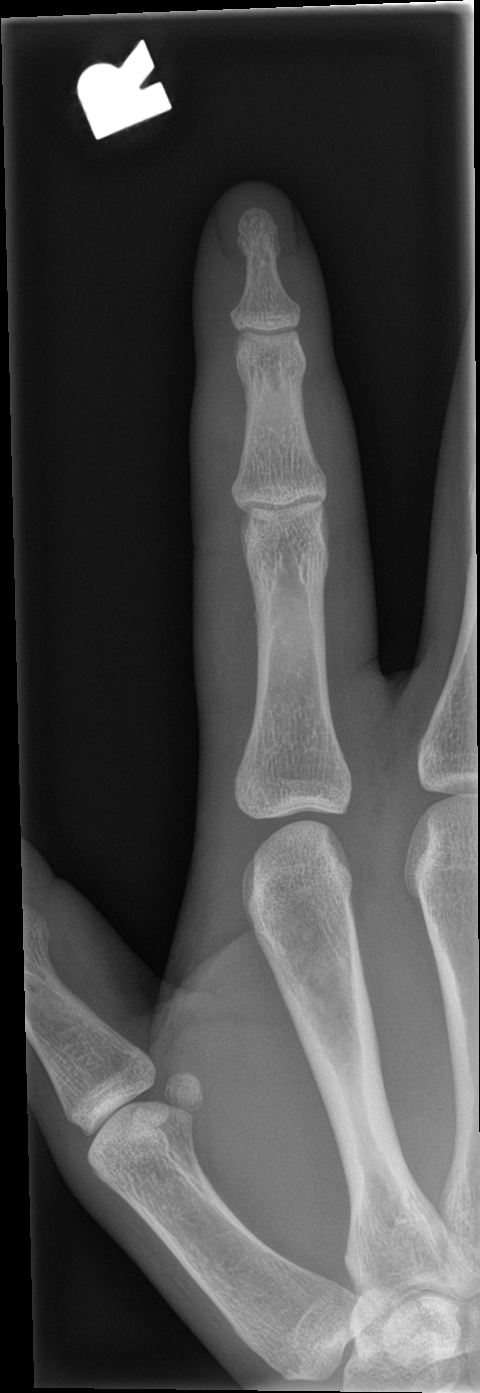

[finger obl]
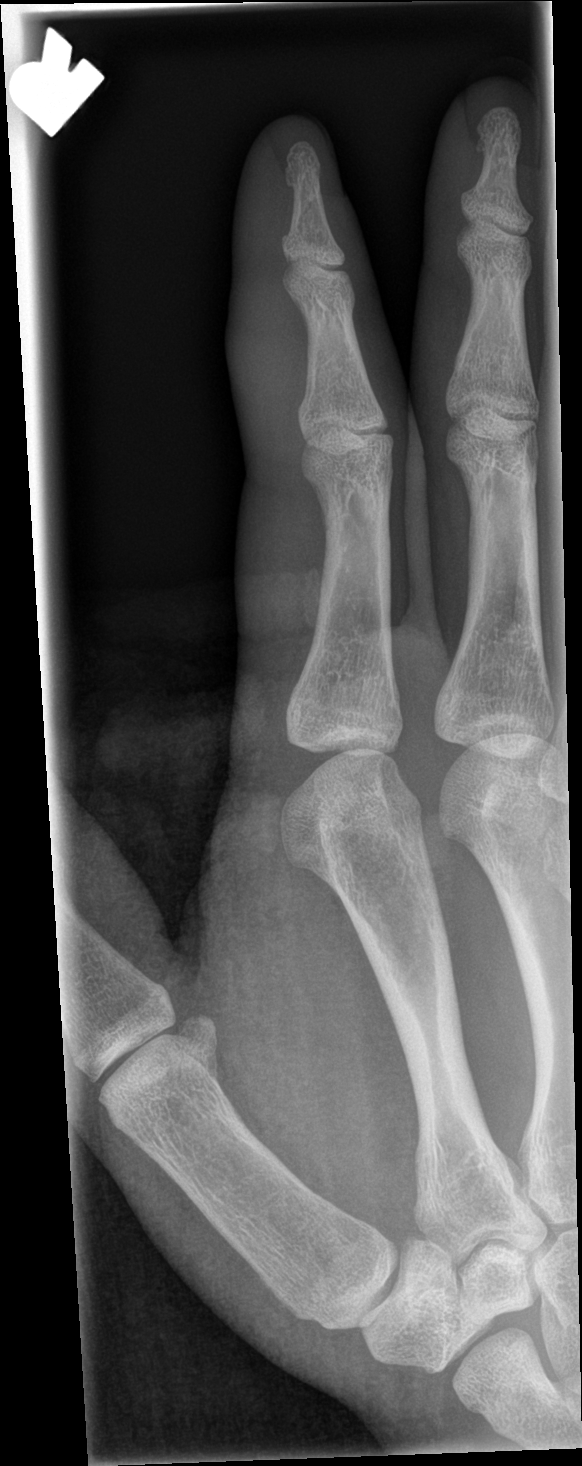

[finger lat]
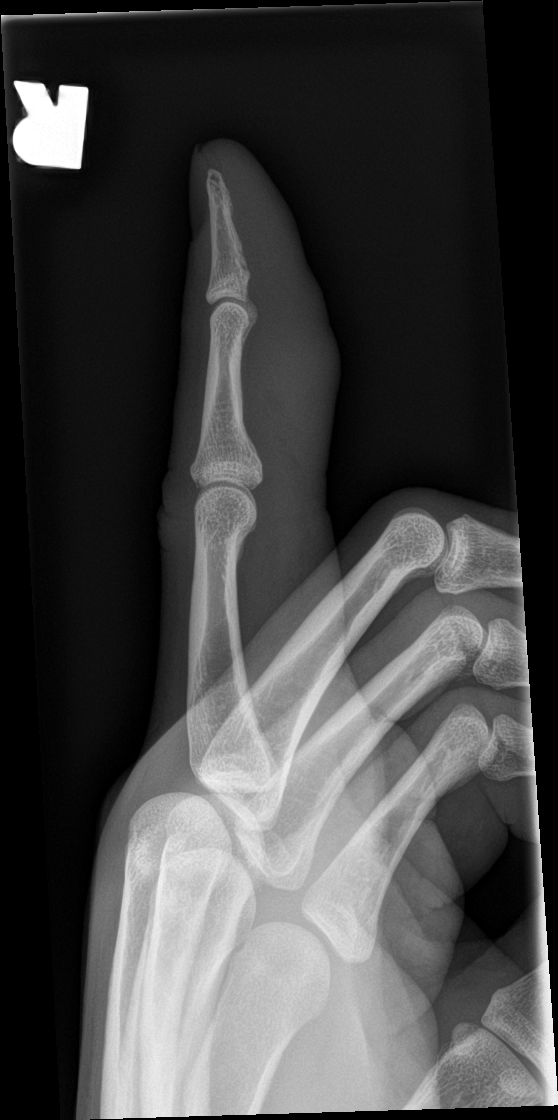

[3 of 3 positions shown; findings below may reference images not displayed]

FINDINGS: No acute fracture or dislocation. No aggressive osseous lesion.
Normal alignment.

Soft tissue swelling along the volar aspect of the second digit. No
radiopaque foreign body or soft tissue emphysema.
IMPRESSION: 1. No acute osseous injury of the right second digit.

## 2023-08-13 ENCOUNTER — Other Ambulatory Visit: Payer: Self-pay

## 2023-08-13 ENCOUNTER — Encounter (HOSPITAL_COMMUNITY): Payer: Self-pay | Admitting: Emergency Medicine

## 2023-08-13 ENCOUNTER — Emergency Department (HOSPITAL_COMMUNITY)
Admission: EM | Admit: 2023-08-13 | Discharge: 2023-08-14 | Disposition: A | Discharge status: Home / Self Care | Payer: 59 | Attending: Emergency Medicine | Admitting: Emergency Medicine

## 2023-08-13 DIAGNOSIS — R112 Nausea with vomiting, unspecified: Secondary | ICD-10-CM | POA: Insufficient documentation

## 2023-08-13 DIAGNOSIS — R1084 Generalized abdominal pain: Secondary | ICD-10-CM | POA: Diagnosis not present

## 2023-08-13 DIAGNOSIS — F12188 Cannabis abuse with other cannabis-induced disorder: Secondary | ICD-10-CM | POA: Diagnosis not present

## 2023-08-13 LAB — COMPREHENSIVE METABOLIC PANEL
ALT: 125 U/L — ABNORMAL HIGH (ref 0–44)
AST: 113 U/L — ABNORMAL HIGH (ref 15–41)
Albumin: 5.8 g/dL — ABNORMAL HIGH (ref 3.5–5.0)
Alkaline Phosphatase: 94 U/L (ref 38–126)
Anion gap: 23 — ABNORMAL HIGH (ref 5–15)
BUN: 10 mg/dL (ref 6–20)
CO2: 20 mmol/L — ABNORMAL LOW (ref 22–32)
Calcium: 10.5 mg/dL — ABNORMAL HIGH (ref 8.9–10.3)
Chloride: 96 mmol/L — ABNORMAL LOW (ref 98–111)
Creatinine, Ser: 0.87 mg/dL (ref 0.61–1.24)
GFR, Estimated: >60 mL/min (ref >60)
Glucose, Bld: 75 mg/dL (ref 70–99)
Potassium: 3.7 mmol/L (ref 3.5–5.1)
Sodium: 139 mmol/L (ref 135–145)
Total Bilirubin: 3.1 mg/dL — ABNORMAL HIGH (ref 0.3–1.2)
Total Protein: 9.2 g/dL — ABNORMAL HIGH (ref 6.5–8.1)

## 2023-08-13 LAB — URINALYSIS, ROUTINE W REFLEX MICROSCOPIC
Bilirubin Urine: NEGATIVE
Glucose, UA: NEGATIVE mg/dL
Ketones, ur: 80 mg/dL — AB
Leukocytes,Ua: NEGATIVE
Nitrite: NEGATIVE
Protein, ur: >=300 mg/dL — AB
Specific Gravity, Urine: 1.022 (ref 1.005–1.030)
pH: 5 (ref 5.0–8.0)

## 2023-08-13 LAB — CBC
HCT: 51.4 % (ref 39.0–52.0)
Hemoglobin: 17.2 g/dL — ABNORMAL HIGH (ref 13.0–17.0)
MCH: 32.5 pg (ref 26.0–34.0)
MCHC: 33.5 g/dL (ref 30.0–36.0)
MCV: 97 fL (ref 80.0–100.0)
Platelets: 240 10*3/uL (ref 150–400)
RBC: 5.3 MIL/uL (ref 4.22–5.81)
RDW: 13.9 % (ref 11.5–15.5)
WBC: 15.4 10*3/uL — ABNORMAL HIGH (ref 4.0–10.5)
nRBC: 0 % (ref 0.0–0.2)

## 2023-08-13 LAB — LIPASE, BLOOD: Lipase: 22 U/L (ref 11–51)

## 2023-08-13 MED ORDER — SODIUM CHLORIDE 0.9 % IV BOLUS
1000.0000 mL | Freq: Once | INTRAVENOUS | Status: AC
  Administered 2023-08-13: 1000 mL via INTRAVENOUS

## 2023-08-13 MED ORDER — LORAZEPAM 2 MG/ML IJ SOLN
1.0000 mg | Freq: Once | INTRAMUSCULAR | Status: AC
  Administered 2023-08-13: 1 mg via INTRAVENOUS
  Filled 2023-08-13: qty 1

## 2023-08-13 MED ORDER — ONDANSETRON HCL 4 MG/2ML IJ SOLN
4.0000 mg | Freq: Once | INTRAMUSCULAR | Status: AC
  Administered 2023-08-13: 4 mg via INTRAVENOUS
  Filled 2023-08-13: qty 2

## 2023-08-13 MED ORDER — DROPERIDOL 2.5 MG/ML IJ SOLN
2.5000 mg | Freq: Once | INTRAMUSCULAR | Status: AC
  Administered 2023-08-13: 2.5 mg via INTRAVENOUS
  Filled 2023-08-13: qty 2

## 2023-08-13 NOTE — ED Triage Notes (Signed)
Pt presents with abdominal pain with emesis, x 1 day, pt has gastroparesis, last smoke marijuana last night.

## 2023-08-13 NOTE — ED Provider Notes (Signed)
[place] EMERGENCY DEPARTMENT AT [facility] Provider Note   CSN: [id number] Arrival date & time: 08/13/23  1456     History {Add pertinent medical, surgical, social history, OB history to HPI:1} Chief Complaint  Patient presents with   Abdominal Pain    Timothy [name] is a 27 y.o. male.  Patient presents to the emergency department for evaluation of diffuse abdominal pain with nausea and vomiting.  Symptoms present for 1 day.       Home Medications Prior to Admission medications   Medication Sig Start Date End Date Taking? Authorizing Provider  chlordiazePOXIDE (LIBRIUM) 25 MG capsule 50mg  PO TID x 1D, then 25-50mg  PO BID X 1D, then 25-50mg  PO QD X 1D 06/16/23   Beatty, Celeste A, PA-C  omeprazole (PRILOSEC) 20 MG capsule Take 1 capsule (20 mg total) by mouth daily. 08/01/22   Triplett, Tammy, PA-C  ondansetron (ZOFRAN) 4 MG tablet Take 1 tablet (4 mg total) by mouth every 6 (six) hours. As needed for nausea vomiting 07/12/23   Triplett, Tammy, PA-C  ondansetron (ZOFRAN-ODT) 4 MG disintegrating tablet Take 1 tablet (4 mg total) by mouth every 8 (eight) hours as needed for nausea or vomiting. 06/17/23   Smoot, Shawn Route, PA-C  pantoprazole (PROTONIX) 40 MG tablet Take 1 tablet (40 mg total) by mouth daily. 06/16/23   Carmel Sacramento A, PA-C  promethazine (PHENERGAN) 25 MG suppository Place 1 suppository (25 mg total) rectally every 6 (six) hours as needed for nausea or vomiting. 06/17/23   Smoot, Shawn Route, PA-C      Allergies    Amoxil [amoxicillin]    Review of Systems   Review of Systems  Physical Exam Updated Vital Signs BP (!) 180/112 (BP Location: Left Arm)   Pulse 82   Temp 97.8 F (36.6 C) (Oral)   Resp 16   Ht 5\' 4"  (1.626 m)   Wt 72.6 kg   SpO2 99%   BMI 27.46 kg/m  Physical Exam Vitals and nursing note reviewed.  Constitutional:      General: He is not in acute distress.    Appearance: He is well-developed.  HENT:     Head: Normocephalic and  atraumatic.     Mouth/Throat:     Mouth: Mucous membranes are moist.  Eyes:     General: Vision grossly intact. Gaze aligned appropriately.     Extraocular Movements: Extraocular movements intact.     Conjunctiva/sclera: Conjunctivae normal.  Cardiovascular:     Rate and Rhythm: Normal rate and regular rhythm.     Pulses: Normal pulses.     Heart sounds: Normal heart sounds, S1 normal and S2 normal. No murmur heard.    No friction rub. No gallop.  Pulmonary:     Effort: Pulmonary effort is normal. No respiratory distress.     Breath sounds: Normal breath sounds.  Abdominal:     Palpations: Abdomen is soft.     Tenderness: There is generalized abdominal tenderness. There is no guarding or rebound.     Hernia: No hernia is present.  Musculoskeletal:        General: No swelling.     Cervical back: Full passive range of motion without pain, normal range of motion and neck supple. No pain with movement, spinous process tenderness or muscular tenderness. Normal range of motion.     Right lower leg: No edema.     Left lower leg: No edema.  Skin:    General: Skin is warm and  dry.     Capillary Refill: Capillary refill takes less than 2 seconds.     Findings: No ecchymosis, erythema, lesion or wound.  Neurological:     Mental Status: He is alert and oriented to person, place, and time.     GCS: GCS eye subscore is 4. GCS verbal subscore is 5. GCS motor subscore is 6.     Cranial Nerves: Cranial nerves 2-12 are intact.     Sensory: Sensation is intact.     Motor: Motor function is intact. No weakness or abnormal muscle tone.     Coordination: Coordination is intact.  Psychiatric:        Mood and Affect: Mood normal.        Speech: Speech normal.        Behavior: Behavior normal.     ED Results / Procedures / Treatments   Labs (all labs ordered are listed, but only abnormal results are displayed) Labs Reviewed  COMPREHENSIVE METABOLIC PANEL - Abnormal; Notable for the following  components:      Result Value   Chloride 96 (*)    CO2 20 (*)    Calcium 10.5 (*)    Total Protein 9.2 (*)    Albumin 5.8 (*)    AST 113 (*)    ALT 125 (*)    Total Bilirubin 3.1 (*)    Anion gap 23 (*)    All other components within normal limits  CBC - Abnormal; Notable for the following components:   WBC 15.4 (*)    Hemoglobin 17.2 (*)    All other components within normal limits  LIPASE, BLOOD  URINALYSIS, ROUTINE W REFLEX MICROSCOPIC    EKG None  Radiology No results found.  Procedures Procedures  {Document cardiac monitor, telemetry assessment procedure when appropriate:1}  Medications Ordered in ED Medications  sodium chloride 0.9 % bolus 1,000 mL (has no administration in time range)  ondansetron (ZOFRAN) injection 4 mg (has no administration in time range)  droperidol (INAPSINE) 2.5 MG/ML injection 2.5 mg (has no administration in time range)  LORazepam (ATIVAN) injection 1 mg (has no administration in time range)    ED Course/ Medical Decision Making/ A&P   {   Click here for ABCD2, HEART and other calculatorsREFRESH Note before signing :1}                              Medical Decision Making Amount and/or Complexity of Data Reviewed Labs: ordered.  Risk Prescription drug management.   Differential Diagnosis considered includes, but not limited to: Cholelithiasis; cholecystitis; cholangitis; bowel obstruction; esophagitis; gastritis; peptic ulcer disease; pancreatitis; cyclic vomiting; cannabinoid hyperemesis  Patient with history of chronic alcohol abuse and marijuana abuse with documented cannabinoid hyperemesis syndrome presents to the ED with diffuse abdominal pain, nausea and vomiting.  Symptoms similar to numerous visits previously.  Abdominal exam nonfocal, no concern for acute surgical process.  Counseled patient, once again on avoidance of marijuana and to curtail alcohol intake.  Treated symptomatically.    {Document critical care time  when appropriate:1} {Document review of labs and clinical decision tools ie heart score, Chads2Vasc2 etc:1}  {Document your independent review of radiology images, and any outside records:1} {Document your discussion with family members, caretakers, and with consultants:1} {Document social determinants of health affecting pt's care:1} {Document your decision making why or why not admission, treatments were needed:1} Final Clinical Impression(s) / ED Diagnoses Final diagnoses:  Cannabinoid hyperemesis  syndrome    Rx / DC Orders ED Discharge Orders     None

## 2023-08-14 MED ORDER — METOCLOPRAMIDE HCL 10 MG PO TABS: 10.0000 mg | ORAL_TABLET | Freq: Four times a day (QID) | ORAL | 0 refills | Status: DC

## 2023-08-14 MED ORDER — ONDANSETRON 4 MG PO TBDP: ORAL_TABLET | ORAL | 0 refills | Status: DC

## 2023-09-02 ENCOUNTER — Encounter (HOSPITAL_COMMUNITY): Payer: Self-pay

## 2023-09-02 ENCOUNTER — Emergency Department (HOSPITAL_COMMUNITY)
Admission: EM | Admit: 2023-09-02 | Discharge: 2023-09-03 | Disposition: A | Discharge status: Home / Self Care | Payer: 59 | Attending: Emergency Medicine | Admitting: Emergency Medicine

## 2023-09-02 ENCOUNTER — Other Ambulatory Visit: Payer: Self-pay

## 2023-09-02 DIAGNOSIS — R112 Nausea with vomiting, unspecified: Secondary | ICD-10-CM | POA: Diagnosis not present

## 2023-09-02 DIAGNOSIS — R030 Elevated blood-pressure reading, without diagnosis of hypertension: Secondary | ICD-10-CM | POA: Insufficient documentation

## 2023-09-02 DIAGNOSIS — E860 Dehydration: Secondary | ICD-10-CM | POA: Insufficient documentation

## 2023-09-02 DIAGNOSIS — R945 Abnormal results of liver function studies: Secondary | ICD-10-CM | POA: Diagnosis not present

## 2023-09-02 DIAGNOSIS — R7989 Other specified abnormal findings of blood chemistry: Secondary | ICD-10-CM

## 2023-09-02 LAB — COMPREHENSIVE METABOLIC PANEL
ALT: 107 U/L — ABNORMAL HIGH (ref 0–44)
AST: 91 U/L — ABNORMAL HIGH (ref 15–41)
Albumin: 5.4 g/dL — ABNORMAL HIGH (ref 3.5–5.0)
Alkaline Phosphatase: 81 U/L (ref 38–126)
Anion gap: 23 — ABNORMAL HIGH (ref 5–15)
BUN: 8 mg/dL (ref 6–20)
CO2: 16 mmol/L — ABNORMAL LOW (ref 22–32)
Calcium: 9.9 mg/dL (ref 8.9–10.3)
Chloride: 97 mmol/L — ABNORMAL LOW (ref 98–111)
Creatinine, Ser: 0.76 mg/dL (ref 0.61–1.24)
GFR, Estimated: >60 mL/min (ref >60)
Glucose, Bld: 76 mg/dL (ref 70–99)
Potassium: 3.6 mmol/L (ref 3.5–5.1)
Sodium: 136 mmol/L (ref 135–145)
Total Bilirubin: 2.9 mg/dL — ABNORMAL HIGH (ref 0.3–1.2)
Total Protein: 8.5 g/dL — ABNORMAL HIGH (ref 6.5–8.1)

## 2023-09-02 LAB — URINALYSIS, ROUTINE W REFLEX MICROSCOPIC
Bacteria, UA: NONE SEEN
Bilirubin Urine: NEGATIVE
Glucose, UA: NEGATIVE mg/dL
Ketones, ur: 80 mg/dL — AB
Leukocytes,Ua: NEGATIVE
Nitrite: NEGATIVE
Protein, ur: >=300 mg/dL — AB
Specific Gravity, Urine: 1.02 (ref 1.005–1.030)
pH: 6 (ref 5.0–8.0)

## 2023-09-02 LAB — CBC
HCT: 49 % (ref 39.0–52.0)
Hemoglobin: 16.6 g/dL (ref 13.0–17.0)
MCH: 33 pg (ref 26.0–34.0)
MCHC: 33.9 g/dL (ref 30.0–36.0)
MCV: 97.4 fL (ref 80.0–100.0)
Platelets: 249 10*3/uL (ref 150–400)
RBC: 5.03 MIL/uL (ref 4.22–5.81)
RDW: 13.8 % (ref 11.5–15.5)
WBC: 10 10*3/uL (ref 4.0–10.5)
nRBC: 0 % (ref 0.0–0.2)

## 2023-09-02 LAB — LIPASE, BLOOD: Lipase: 21 U/L (ref 11–51)

## 2023-09-02 MED ORDER — METOCLOPRAMIDE HCL 5 MG/ML IJ SOLN
10.0000 mg | Freq: Once | INTRAMUSCULAR | Status: AC
  Administered 2023-09-02: 10 mg via INTRAVENOUS
  Filled 2023-09-02: qty 2

## 2023-09-02 MED ORDER — LACTATED RINGERS IV BOLUS
1000.0000 mL | Freq: Once | INTRAVENOUS | Status: AC
  Administered 2023-09-02: 1000 mL via INTRAVENOUS

## 2023-09-02 MED ORDER — PROMETHAZINE HCL 12.5 MG PO TABS
25.0000 mg | ORAL_TABLET | Freq: Once | ORAL | Status: AC
  Administered 2023-09-02: 25 mg via ORAL
  Filled 2023-09-02: qty 2

## 2023-09-02 MED ORDER — ONDANSETRON 8 MG PO TBDP
8.0000 mg | ORAL_TABLET | Freq: Once | ORAL | Status: AC
  Administered 2023-09-02: 8 mg via ORAL
  Filled 2023-09-02: qty 1

## 2023-09-02 MED ORDER — ONDANSETRON 8 MG PO TBDP: 8.0000 mg | ORAL_TABLET | Freq: Three times a day (TID) | ORAL | 0 refills | Status: DC | PRN

## 2023-09-02 NOTE — Discharge Instructions (Addendum)
It was our pleasure to provide your ER care today - we hope that you feel better.  Drink plenty of fluids/stay well hydrated.  Take zofran as need for nausea.  Continue protonix.   Your blood pressure is high today - follow heart health eating plan, limit salt intake, and follow up closely with primary care doctor in 1-2 weeks.   Note that increasingly we are seeing a recurrent  vomiting and often upper abdominal pain syndrome called Cannabinoid Hyperemesis Syndrome - see attached info - in these cases avoiding marijuana use will prevent symptoms from recurring (note that symptoms can persist for a few weeks if history of heavy marijuana use as it can take time to get out of system).   From today's labs (and previously) some of your liver function tests are elevated - follow up with primary care doctor for recheck in the coming week.   Return to ER right away  if worse, fevers, new symptoms, new or worsening or severe abdominal pain, persistent vomiting, chest pain, trouble breathing, or other emergency concern.  You were given pain meds in the ER - no driving for the next 6 hours.

## 2023-09-02 NOTE — ED Notes (Signed)
Pt given juice per request, denies any N&V at this time

## 2023-09-02 NOTE — ED Provider Notes (Addendum)
[place] EMERGENCY DEPARTMENT AT [facility] Provider Note   CSN: [id number] Arrival date & time: 09/02/23  1754     History  Chief Complaint  Patient presents with  . Emesis    Timothy [name] is a 28 y.o. male.  Pt presents with recurrent nausea/vomiting. Notes hx same. Hx CHS noted in chart. No abd pain. No bloody or bilious emesis. No abd distension. Having normal bms. No dysuria or gu c/o. Denies back or flank pain. No chest pain or sob. No known bad food ingestion or ill contacts. No fever or chills.   The history is provided by the patient, medical records and a parent.  Emesis Associated symptoms: no abdominal pain, no chills, no cough, no diarrhea, no fever, no headaches and no sore throat        Home Medications Prior to Admission medications   Medication Sig Start Date End Date Taking? Authorizing Provider  chlordiazePOXIDE (LIBRIUM) 25 MG capsule 50mg  PO TID x 1D, then 25-50mg  PO BID X 1D, then 25-50mg  PO QD X 1D 06/16/23   Beatty, Celeste A, PA-C  metoCLOPramide (REGLAN) 10 MG tablet Take 1 tablet (10 mg total) by mouth every 6 (six) hours. 08/14/23   Gilda Crease, MD  omeprazole (PRILOSEC) 20 MG capsule Take 1 capsule (20 mg total) by mouth daily. 08/01/22   Triplett, Tammy, PA-C  ondansetron (ZOFRAN) 4 MG tablet Take 1 tablet (4 mg total) by mouth every 6 (six) hours. As needed for nausea vomiting 07/12/23   Triplett, Tammy, PA-C  ondansetron (ZOFRAN-ODT) 4 MG disintegrating tablet 4mg  ODT q4 hours prn nausea/vomit 08/14/23   Pollina, Canary Brim, MD  pantoprazole (PROTONIX) 40 MG tablet Take 1 tablet (40 mg total) by mouth daily. 06/16/23   Carmel Sacramento A, PA-C  promethazine (PHENERGAN) 25 MG suppository Place 1 suppository (25 mg total) rectally every 6 (six) hours as needed for nausea or vomiting. 06/17/23   Smoot, Shawn Route, PA-C      Allergies    Amoxil [amoxicillin]    Review of Systems   Review of Systems  Constitutional:  Negative for  chills and fever.  HENT:  Negative for sore throat.   Respiratory:  Negative for cough and shortness of breath.   Cardiovascular:  Negative for chest pain.  Gastrointestinal:  Positive for nausea and vomiting. Negative for abdominal pain, blood in stool, constipation and diarrhea.  Genitourinary:  Negative for decreased urine volume and flank pain.  Musculoskeletal:  Negative for back pain and neck pain.  Skin:  Negative for rash.  Neurological:  Negative for headaches.    Physical Exam Updated Vital Signs BP (!) 155/114   Pulse 89   Temp 98 F (36.7 C) (Oral)   Resp 15   Ht 1.626 m (5\' 4" )   Wt 72.6 kg   SpO2 100%   BMI 27.46 kg/m  Physical Exam Vitals and nursing note reviewed.  Constitutional:      Appearance: Normal appearance. He is well-developed.  HENT:     Head: Atraumatic.     Nose: Nose normal.     Mouth/Throat:     Mouth: Mucous membranes are moist.     Pharynx: Oropharynx is clear.  Eyes:     General: No scleral icterus.    Conjunctiva/sclera: Conjunctivae normal.     Pupils: Pupils are equal, round, and reactive to light.  Neck:     Trachea: No tracheal deviation.  Cardiovascular:     Rate and Rhythm:  Normal rate and regular rhythm.     Pulses: Normal pulses.     Heart sounds: Normal heart sounds. No murmur heard.    No friction rub. No gallop.  Pulmonary:     Effort: Pulmonary effort is normal. No accessory muscle usage or respiratory distress.     Breath sounds: Normal breath sounds.  Abdominal:     General: Bowel sounds are normal. There is no distension.     Palpations: Abdomen is soft. There is no mass.     Tenderness: There is no abdominal tenderness. There is no guarding.     Hernia: No hernia is present.  Genitourinary:    Comments: No cva tenderness. Musculoskeletal:        General: No swelling or tenderness.     Cervical back: Normal range of motion and neck supple. No rigidity.  Skin:    General: Skin is warm and dry.     Findings: No  rash.  Neurological:     Mental Status: He is alert.     Comments: Alert, speech clear. Motor/sens grossly intact bil. Steady gait.   Psychiatric:        Mood and Affect: Mood normal.    ED Results / Procedures / Treatments   Labs (all labs ordered are listed, but only abnormal results are displayed) Results for orders placed or performed during the hospital encounter of 09/02/23  Lipase, blood  Result Value Ref Range   Lipase 21 11 - 51 U/L  Comprehensive metabolic panel  Result Value Ref Range   Sodium 136 135 - 145 mmol/L   Potassium 3.6 3.5 - 5.1 mmol/L   Chloride 97 (L) 98 - 111 mmol/L   CO2 16 (L) 22 - 32 mmol/L   Glucose, Bld 76 70 - 99 mg/dL   BUN 8 6 - 20 mg/dL   Creatinine, Ser 1.61 0.61 - 1.24 mg/dL   Calcium 9.9 8.9 - 09.6 mg/dL   Total Protein 8.5 (H) 6.5 - 8.1 g/dL   Albumin 5.4 (H) 3.5 - 5.0 g/dL   AST 91 (H) 15 - 41 U/L   ALT 107 (H) 0 - 44 U/L   Alkaline Phosphatase 81 38 - 126 U/L   Total Bilirubin 2.9 (H) 0.3 - 1.2 mg/dL   GFR, Estimated >04 >54 mL/min   Anion gap 23 (H) 5 - 15  CBC  Result Value Ref Range   WBC 10.0 4.0 - 10.5 K/uL   RBC 5.03 4.22 - 5.81 MIL/uL   Hemoglobin 16.6 13.0 - 17.0 g/dL   HCT 09.8 11.9 - 14.7 %   MCV 97.4 80.0 - 100.0 fL   MCH 33.0 26.0 - 34.0 pg   MCHC 33.9 30.0 - 36.0 g/dL   RDW 82.9 56.2 - 13.0 %   Platelets 249 150 - 400 K/uL   nRBC 0.0 0.0 - 0.2 %  Urinalysis, Routine w reflex microscopic -Urine, Clean Catch  Result Value Ref Range   Color, Urine YELLOW YELLOW   APPearance CLEAR CLEAR   Specific Gravity, Urine 1.020 1.005 - 1.030   pH 6.0 5.0 - 8.0   Glucose, UA NEGATIVE NEGATIVE mg/dL   Hgb urine dipstick SMALL (A) NEGATIVE   Bilirubin Urine NEGATIVE NEGATIVE   Ketones, ur 80 (A) NEGATIVE mg/dL   Protein, ur >=865 (A) NEGATIVE mg/dL   Nitrite NEGATIVE NEGATIVE   Leukocytes,Ua NEGATIVE NEGATIVE   RBC / HPF 0-5 0 - 5 RBC/hpf   WBC, UA 0-5 0 - 5 WBC/hpf  Bacteria, UA NONE SEEN NONE SEEN   Squamous  Epithelial / HPF 0-5 0 - 5 /HPF    EKG None  Radiology No results found.  Procedures Procedures    Medications Ordered in ED Medications  ondansetron (ZOFRAN-ODT) disintegrating tablet 8 mg (8 mg Oral Given 09/02/23 1931)  promethazine (PHENERGAN) tablet 25 mg (25 mg Oral Given 09/02/23 2057)    ED Course/ Medical Decision Making/ A&P                                 Medical Decision Making Problems Addressed: Dehydration: acute illness or injury with systemic symptoms that poses a threat to life or bodily functions Elevated blood pressure reading: acute illness or injury Elevated liver function tests: chronic illness or injury Nausea and vomiting in adult: acute illness or injury with systemic symptoms that poses a threat to life or bodily functions  Amount and/or Complexity of Data Reviewed Independent Historian: parent    Details: hx External Data Reviewed: labs, radiology and notes. Labs: ordered. Decision-making details documented in ED Course. Radiology: independent interpretation performed. Decision-making details documented in ED Course.  Risk Prescription drug management. Decision regarding hospitalization.   Labs ordered/sent.   Differential diagnosis includes gastroenteritis, dehydration, chs, gastritis, etc. Dispo decision including potential need for admission considered - will get labs and imaging and reassess.   Reviewed nursing notes and prior charts for additional history. External reports reviewed. Additional history from: parent  Labs reviewed/interpreted by me - wbc and hgb normal. Hco3 low, ?vomiting/dehydration.  Similar elevation lfts noted prior.   Prior CT for same symptoms reviewed/interpreted by me -  no acute process then.   Zofran po. Nausea improved but persists. Phenergan po. Po fluid trial (given current ivf shortage, discussed po trial w pt).   Patient is tolerating po fluids. No recurrent emesis. Abd is soft and non tender. Hr  normal.   Pt with recurrent nausea/not tolerating po. Dry heaves. Abd soft nt. Pt/fam requests iv.  As intractable nausea, dehydration on labs, will give ivf. LR bolus iv. Reglan iv.   2340 signed out to oncoming EDP, Dr Durwin Nora to reassess after ivf and po trial, and dispo appropriately.          Final Clinical Impression(s) / ED Diagnoses Final diagnoses:  Nausea and vomiting in adult  Dehydration  Elevated blood pressure reading  Elevated liver function tests    Rx / DC Orders ED Discharge Orders     None           Cathren Laine, MD 09/02/23 2342

## 2023-09-02 NOTE — ED Triage Notes (Signed)
Vomited about 20 times today Denies diarrhea Denies ABD pain  Pt stated he smoked 2 blunts

## 2023-09-03 MED ORDER — DROPERIDOL 2.5 MG/ML IJ SOLN
0.6250 mg | Freq: Once | INTRAMUSCULAR | Status: AC
  Administered 2023-09-03: 0.625 mg via INTRAVENOUS
  Filled 2023-09-03: qty 2

## 2023-09-03 NOTE — ED Provider Notes (Signed)
Care of patient assumed from Dr. Denton Lank.  Patient presenting for nausea and vomiting, possibly cannabinoid induced.  Currently getting additional fluids and antiemetics.  Can be discharged after p.o. challenge. Physical Exam  BP (!) 155/114   Pulse 89   Temp 97.9 F (36.6 C) (Oral)   Resp 15   Ht 5\' 4"  (1.626 m)   Wt 72.6 kg   SpO2 100%   BMI 27.46 kg/m   Physical Exam Vitals and nursing note reviewed.  Constitutional:      General: He is not in acute distress.    Appearance: Normal appearance. He is well-developed. He is not ill-appearing, toxic-appearing or diaphoretic.  HENT:     Head: Normocephalic and atraumatic.     Right Ear: External ear normal.     Left Ear: External ear normal.     Nose: Nose normal.     Mouth/Throat:     Mouth: Mucous membranes are moist.  Eyes:     Extraocular Movements: Extraocular movements intact.     Conjunctiva/sclera: Conjunctivae normal.  Cardiovascular:     Rate and Rhythm: Normal rate and regular rhythm.  Pulmonary:     Effort: Pulmonary effort is normal. No respiratory distress.  Abdominal:     General: There is no distension.     Palpations: Abdomen is soft.  Musculoskeletal:        General: No swelling. Normal range of motion.     Cervical back: Normal range of motion and neck supple.  Skin:    General: Skin is warm and dry.     Coloration: Skin is not jaundiced or pale.  Neurological:     General: No focal deficit present.     Mental Status: He is alert and oriented to person, place, and time.  Psychiatric:        Mood and Affect: Mood normal.        Behavior: Behavior normal.     Procedures  Procedures  ED Course / MDM    Medical Decision Making Amount and/or Complexity of Data Reviewed Labs: ordered.  Risk Prescription drug management.   Following additional IV fluids and dose of droperidol, patient had resolution of symptoms.  He was discharged in good condition.       Gloris Manchester, MD 09/03/23 [phone]

## 2023-09-03 NOTE — ED Notes (Signed)
Attempted to obtain a EKG at this time, but pt is in bathroom.

## 2023-09-03 NOTE — ED Notes (Signed)
Pt observed making himself vomit by this RN. Pt refusing to allow this RN to gather and update V/S.

## 2024-02-14 ENCOUNTER — Other Ambulatory Visit: Payer: Self-pay

## 2024-02-14 ENCOUNTER — Encounter (HOSPITAL_COMMUNITY): Payer: Self-pay

## 2024-02-14 ENCOUNTER — Emergency Department (HOSPITAL_COMMUNITY)
Admission: EM | Admit: 2024-02-14 | Discharge: 2024-02-14 | Disposition: A | Discharge status: Home / Self Care | Attending: Emergency Medicine | Admitting: Emergency Medicine

## 2024-02-14 DIAGNOSIS — R112 Nausea with vomiting, unspecified: Secondary | ICD-10-CM | POA: Diagnosis not present

## 2024-02-14 LAB — CBC WITH DIFFERENTIAL/PLATELET
Abs Immature Granulocytes: 0.03 10*3/uL (ref 0.00–0.07)
Basophils Absolute: 0.1 10*3/uL (ref 0.0–0.1)
Basophils Relative: 1 %
Eosinophils Absolute: 0 10*3/uL (ref 0.0–0.5)
Eosinophils Relative: 0 %
HCT: 49.9 % (ref 39.0–52.0)
Hemoglobin: 16.6 g/dL (ref 13.0–17.0)
Immature Granulocytes: 0 %
Lymphocytes Relative: 5 %
Lymphs Abs: 0.5 10*3/uL — ABNORMAL LOW (ref 0.7–4.0)
MCH: 31.9 pg (ref 26.0–34.0)
MCHC: 33.3 g/dL (ref 30.0–36.0)
MCV: 95.8 fL (ref 80.0–100.0)
Monocytes Absolute: 0.8 10*3/uL (ref 0.1–1.0)
Monocytes Relative: 9 %
Neutro Abs: 8.2 10*3/uL — ABNORMAL HIGH (ref 1.7–7.7)
Neutrophils Relative %: 85 %
Platelets: 272 10*3/uL (ref 150–400)
RBC: 5.21 MIL/uL (ref 4.22–5.81)
RDW: 14.2 % (ref 11.5–15.5)
WBC: 9.6 10*3/uL (ref 4.0–10.5)
nRBC: 0 % (ref 0.0–0.2)

## 2024-02-14 LAB — COMPREHENSIVE METABOLIC PANEL WITH GFR
ALT: 72 U/L — ABNORMAL HIGH (ref 0–44)
AST: 84 U/L — ABNORMAL HIGH (ref 15–41)
Albumin: 5.4 g/dL — ABNORMAL HIGH (ref 3.5–5.0)
Alkaline Phosphatase: 88 U/L (ref 38–126)
Anion gap: 17 — ABNORMAL HIGH (ref 5–15)
BUN: 7 mg/dL (ref 6–20)
CO2: 27 mmol/L (ref 22–32)
Calcium: 10.5 mg/dL — ABNORMAL HIGH (ref 8.9–10.3)
Chloride: 94 mmol/L — ABNORMAL LOW (ref 98–111)
Creatinine, Ser: 0.8 mg/dL (ref 0.61–1.24)
GFR, Estimated: >60 mL/min (ref >60)
Glucose, Bld: 106 mg/dL — ABNORMAL HIGH (ref 70–99)
Potassium: 3.3 mmol/L — ABNORMAL LOW (ref 3.5–5.1)
Sodium: 138 mmol/L (ref 135–145)
Total Bilirubin: 3.4 mg/dL — ABNORMAL HIGH (ref 0.0–1.2)
Total Protein: 9 g/dL — ABNORMAL HIGH (ref 6.5–8.1)

## 2024-02-14 LAB — URINALYSIS, ROUTINE W REFLEX MICROSCOPIC
Bacteria, UA: NONE SEEN
Bilirubin Urine: NEGATIVE
Glucose, UA: NEGATIVE mg/dL
Hgb urine dipstick: NEGATIVE
Ketones, ur: 20 mg/dL — AB
Leukocytes,Ua: NEGATIVE
Nitrite: NEGATIVE
Protein, ur: 100 mg/dL — AB
Specific Gravity, Urine: 1.021 (ref 1.005–1.030)
pH: 7 (ref 5.0–8.0)

## 2024-02-14 LAB — LIPASE, BLOOD: Lipase: 25 U/L (ref 11–51)

## 2024-02-14 MED ORDER — PROCHLORPERAZINE EDISYLATE 10 MG/2ML IJ SOLN
10.0000 mg | Freq: Once | INTRAMUSCULAR | Status: AC
  Administered 2024-02-14: 10 mg via INTRAVENOUS
  Filled 2024-02-14: qty 2

## 2024-02-14 MED ORDER — ONDANSETRON 4 MG PO TBDP: 4.0000 mg | ORAL_TABLET | Freq: Three times a day (TID) | ORAL | 0 refills | Status: DC | PRN

## 2024-02-14 MED ORDER — SODIUM CHLORIDE 0.9 % IV BOLUS
1000.0000 mL | Freq: Once | INTRAVENOUS | Status: AC
  Administered 2024-02-14: 1000 mL via INTRAVENOUS

## 2024-02-14 MED ORDER — POTASSIUM CHLORIDE CRYS ER 20 MEQ PO TBCR: 20.0000 meq | EXTENDED_RELEASE_TABLET | Freq: Two times a day (BID) | ORAL | 0 refills | Status: DC

## 2024-02-14 MED ORDER — PROCHLORPERAZINE MALEATE 10 MG PO TABS: 10.0000 mg | ORAL_TABLET | Freq: Two times a day (BID) | ORAL | 0 refills | Status: DC | PRN

## 2024-02-14 MED ORDER — PANTOPRAZOLE SODIUM 40 MG IV SOLR
40.0000 mg | Freq: Once | INTRAVENOUS | Status: AC
  Administered 2024-02-14: 40 mg via INTRAVENOUS
  Filled 2024-02-14: qty 10

## 2024-02-14 MED ORDER — ONDANSETRON HCL 4 MG/2ML IJ SOLN
4.0000 mg | Freq: Once | INTRAMUSCULAR | Status: AC
  Administered 2024-02-14: 4 mg via INTRAVENOUS
  Filled 2024-02-14: qty 2

## 2024-02-14 NOTE — ED Triage Notes (Signed)
 Pt arrived via POV c/o hyperemesis that began this morning around 0900. Pt seen here in [facility] for similar presentation of symptoms in the past.

## 2024-02-14 NOTE — Discharge Instructions (Addendum)
 You can use Compazine twice daily and Zofran every 8 hours if needed to control nausea and vomiting at home. Push fluids in small amounts and advance diet as tolerated.   You have been given a prescription for potassium as well. Wait until your nausea is well controlled before taking this twice daily for 3 days as your potassium was mildly low tonight.

## 2024-02-14 NOTE — ED Provider Notes (Signed)
 [place] EMERGENCY DEPARTMENT AT [facility] Provider Note   CSN: [id number] Arrival date & time: 02/14/24  1419     History  Chief Complaint  Patient presents with   Emesis    Timothy [name] is a 28 y.o. male.  Patient with a history of alcoholism and cannabinoid hyperemesis presents to ED with vomiting that started this morning. He reports TNTC episodes, the later ones blood tinged. No fever. He denies abdominal pain, diarrhea, chest pain, SOB.   The history is provided by the patient. No language interpreter was used.  Emesis      Home Medications Prior to Admission medications   Medication Sig Start Date End Date Taking? Authorizing Provider  ondansetron (ZOFRAN-ODT) 4 MG disintegrating tablet Take 1 tablet (4 mg total) by mouth every 8 (eight) hours as needed for nausea or vomiting. 02/14/24  Yes Damonte Frieson, PA-C  potassium chloride SA (KLOR-CON M) 20 MEQ tablet Take 1 tablet (20 mEq total) by mouth 2 (two) times daily. 02/14/24  Yes Elpidio Anis, PA-C  prochlorperazine (COMPAZINE) 10 MG tablet Take 1 tablet (10 mg total) by mouth 2 (two) times daily as needed for nausea or vomiting. 02/14/24  Yes Leira Regino, PA-C  chlordiazePOXIDE (LIBRIUM) 25 MG capsule 50mg  PO TID x 1D, then 25-50mg  PO BID X 1D, then 25-50mg  PO QD X 1D 06/16/23   Beatty, Celeste A, PA-C  metoCLOPramide (REGLAN) 10 MG tablet Take 1 tablet (10 mg total) by mouth every 6 (six) hours. 08/14/23   Gilda Crease, MD  omeprazole (PRILOSEC) 20 MG capsule Take 1 capsule (20 mg total) by mouth daily. 08/01/22   Triplett, Tammy, PA-C  ondansetron (ZOFRAN) 4 MG tablet Take 1 tablet (4 mg total) by mouth every 6 (six) hours. As needed for nausea vomiting 07/12/23   Triplett, Tammy, PA-C  pantoprazole (PROTONIX) 40 MG tablet Take 1 tablet (40 mg total) by mouth daily. 06/16/23   Carmel [place] A, PA-C  promethazine (PHENERGAN) 25 MG suppository Place 1 suppository (25 mg total) rectally every 6  (six) hours as needed for nausea or vomiting. 06/17/23   Smoot, Shawn Route, PA-C      Allergies    Amoxil [amoxicillin]    Review of Systems   Review of Systems  Gastrointestinal:  Positive for vomiting.    Physical Exam Updated Vital Signs BP (!) 145/115 (BP Location: Right Arm)   Pulse 94   Temp 97.9 F (36.6 C) (Oral)   Resp 20   Ht 5\' 4"  (1.626 m)   Wt 72.6 kg   SpO2 100%   BMI 27.47 kg/m  Physical Exam Constitutional:      Appearance: He is well-developed.  HENT:     Mouth/Throat:     Mouth: Mucous membranes are dry.  Pulmonary:     Effort: Pulmonary effort is normal.  Abdominal:     General: There is no distension.     Palpations: Abdomen is soft.     Tenderness: There is no abdominal tenderness.  Musculoskeletal:        General: Normal range of motion.     Cervical back: Normal range of motion.  Skin:    General: Skin is warm and dry.  Neurological:     Mental Status: He is alert and oriented to person, place, and time.     ED Results / Procedures / Treatments   Labs (all labs ordered are listed, but only abnormal results are displayed) Labs Reviewed  COMPREHENSIVE METABOLIC  PANEL WITH GFR - Abnormal; Notable for the following components:      Result Value   Potassium 3.3 (*)    Chloride 94 (*)    Glucose, Bld 106 (*)    Calcium 10.5 (*)    Total Protein 9.0 (*)    Albumin 5.4 (*)    AST 84 (*)    ALT 72 (*)    Total Bilirubin 3.4 (*)    Anion gap 17 (*)    All other components within normal limits  CBC WITH DIFFERENTIAL/PLATELET - Abnormal; Notable for the following components:   Neutro Abs 8.2 (*)    Lymphs Abs 0.5 (*)    All other components within normal limits  URINALYSIS, ROUTINE W REFLEX MICROSCOPIC - Abnormal; Notable for the following components:   Ketones, ur 20 (*)    Protein, ur 100 (*)    All other components within normal limits  LIPASE, BLOOD   Results for orders placed or performed during the hospital encounter of 02/14/24   Comprehensive metabolic panel   Collection Time: 02/14/24  4:01 PM  Result Value Ref Range   Sodium 138 135 - 145 mmol/L   Potassium 3.3 (L) 3.5 - 5.1 mmol/L   Chloride 94 (L) 98 - 111 mmol/L   CO2 27 22 - 32 mmol/L   Glucose, Bld 106 (H) 70 - 99 mg/dL   BUN 7 6 - 20 mg/dL   Creatinine, Ser 5.78 0.61 - 1.24 mg/dL   Calcium 46.9 (H) 8.9 - 10.3 mg/dL   Total Protein 9.0 (H) 6.5 - 8.1 g/dL   Albumin 5.4 (H) 3.5 - 5.0 g/dL   AST 84 (H) 15 - 41 U/L   ALT 72 (H) 0 - 44 U/L   Alkaline Phosphatase 88 38 - 126 U/L   Total Bilirubin 3.4 (H) 0.0 - 1.2 mg/dL   GFR, Estimated >62 >95 mL/min   Anion gap 17 (H) 5 - 15  CBC with Differential   Collection Time: 02/14/24  4:01 PM  Result Value Ref Range   WBC 9.6 4.0 - 10.5 K/uL   RBC 5.21 4.22 - 5.81 MIL/uL   Hemoglobin 16.6 13.0 - 17.0 g/dL   HCT 28.4 13.2 - 44.0 %   MCV 95.8 80.0 - 100.0 fL   MCH 31.9 26.0 - 34.0 pg   MCHC 33.3 30.0 - 36.0 g/dL   RDW 10.2 72.5 - 36.6 %   Platelets 272 150 - 400 K/uL   nRBC 0.0 0.0 - 0.2 %   Neutrophils Relative % 85 %   Neutro Abs 8.2 (H) 1.7 - 7.7 K/uL   Lymphocytes Relative 5 %   Lymphs Abs 0.5 (L) 0.7 - 4.0 K/uL   Monocytes Relative 9 %   Monocytes Absolute 0.8 0.1 - 1.0 K/uL   Eosinophils Relative 0 %   Eosinophils Absolute 0.0 0.0 - 0.5 K/uL   Basophils Relative 1 %   Basophils Absolute 0.1 0.0 - 0.1 K/uL   Immature Granulocytes 0 %   Abs Immature Granulocytes 0.03 0.00 - 0.07 K/uL  Lipase, blood   Collection Time: 02/14/24  4:01 PM  Result Value Ref Range   Lipase 25 11 - 51 U/L  Urinalysis, Routine w reflex microscopic -Urine, Clean Catch   Collection Time: 02/14/24  4:40 PM  Result Value Ref Range   Color, Urine YELLOW YELLOW   APPearance CLEAR CLEAR   Specific Gravity, Urine 1.021 1.005 - 1.030   pH 7.0 5.0 - 8.0  Glucose, UA NEGATIVE NEGATIVE mg/dL   Hgb urine dipstick NEGATIVE NEGATIVE   Bilirubin Urine NEGATIVE NEGATIVE   Ketones, ur 20 (A) NEGATIVE mg/dL   Protein, ur 409  (A) NEGATIVE mg/dL   Nitrite NEGATIVE NEGATIVE   Leukocytes,Ua NEGATIVE NEGATIVE   RBC / HPF 0-5 0 - 5 RBC/hpf   WBC, UA 0-5 0 - 5 WBC/hpf   Bacteria, UA NONE SEEN NONE SEEN   Squamous Epithelial / HPF 0-5 0 - 5 /HPF   Mucus PRESENT     EKG None  Radiology No results found.  Procedures Procedures    Medications Ordered in ED Medications  sodium chloride 0.9 % bolus 1,000 mL (0 mLs Intravenous Stopped 02/14/24 1839)  ondansetron (ZOFRAN) injection 4 mg (4 mg Intravenous Given 02/14/24 1608)  pantoprazole (PROTONIX) injection 40 mg (40 mg Intravenous Given 02/14/24 1609)  prochlorperazine (COMPAZINE) injection 10 mg (10 mg Intravenous Given 02/14/24 1823)  sodium chloride 0.9 % bolus 1,000 mL (1,000 mLs Intravenous New Bag/Given 02/14/24 1822)    ED Course/ Medical Decision Making/ A&P Clinical Course as of 02/14/24 2031  Thu Feb 14, 2024  1922 Patient vomiting after Zofran with PO challenge. Compazine provided. On recheck he reports persistent nausea without further vomiting. Will repeat PO challenge. Labs reassuring. Potassium a little low at 3.3.  [SU]  2026 Tolerating water by mouth without vomiting. 2nd liter of fluids near complete. He is felt appropriate for discharge home. He will be given a Rx for Compazine for control of nausea. He will also be given a Rx for potassium supplement that he can start when nausea subsides.  [SU]    Clinical Course User Index [SU] Elpidio Anis, PA-C                                 Medical Decision Making This patient presents to the ED for concern of vomiting, this involves an extensive number of treatment options, and is a complaint that carries with it a high risk of complications and morbidity.  The differential diagnosis includes gastroenteritis, infection/sepsis, recurrent cannabinoid hyperemesis, pancreatitis   Co morbidities that complicate the patient evaluation  History of alcoholism, hyperemesis   Additional history  obtained:  Additional history and/or information obtained from chart review, notable for multiple previous visits for above medical history   Lab Tests:  I Ordered, and personally interpreted labs.  The pertinent results include:  WBC 9.6, hgb 16.6; anion Gap 17; AST 84, ALT 72, total bili 3.4 (LFT's similar to previous); Na 138, K+ 3.3, Cl 94    Imaging Studies ordered:  I ordered imaging studies including n/a I independently visualized and interpreted imaging which showed n/a I agree with the radiologist interpretation   Cardiac Monitoring:  The patient was maintained on a cardiac monitor.  I personally viewed and interpreted the cardiac monitored which showed an underlying rhythm of: n/a   Medicines ordered and prescription drug management:  I ordered medication including zofran  for nausea Reevaluation of the patient after these medicines showed that the patient stayed the same I have reviewed the patients home medicines and have made adjustments as needed   Test Considered:  Lipase done and is normal Abdominal exam benign/nontender No fever/WBC elevation Target symptomatic treatment and rehydration.   Critical Interventions:  N/a   Consultations Obtained:  I requested consultation with the n/a,  and discussed lab and imaging findings as well as pertinent plan -  they recommend: n/a   Problem List / ED Course:  Nausea and vomiting after alcohol and cannabis use - same symptoms in the past No fever, pain, hematemeis Target symptomatic treatment   Reevaluation:  After the interventions noted above, I reevaluated the patient and found that they have :improved   Social Determinants of Health:  Alcoholism and persistent marijuana use likely contributing to symptoms.    Disposition:  After consideration of the diagnostic results and the patients response to treatment, I feel that the patient would benefit from discharge home.   Amount and/or  Complexity of Data Reviewed Labs: ordered.  Risk Prescription drug management.           Final Clinical Impression(s) / ED Diagnoses Final diagnoses:  Nausea and vomiting, unspecified vomiting type    Rx / DC Orders ED Discharge Orders          Ordered    potassium chloride SA (KLOR-CON M) 20 MEQ tablet  2 times daily        02/14/24 2029    prochlorperazine (COMPAZINE) 10 MG tablet  2 times daily PRN        02/14/24 2029    ondansetron (ZOFRAN-ODT) 4 MG disintegrating tablet  Every 8 hours PRN        02/14/24 2029              Elpidio Anis, PA-C 02/14/24 2031    Rondel Baton, MD 02/19/24 1409

## 2024-03-12 ENCOUNTER — Encounter (HOSPITAL_COMMUNITY): Payer: Self-pay

## 2024-03-12 ENCOUNTER — Emergency Department (HOSPITAL_COMMUNITY)
Admission: EM | Admit: 2024-03-12 | Discharge: 2024-03-12 | Disposition: A | Discharge status: Home / Self Care | Attending: Emergency Medicine | Admitting: Emergency Medicine

## 2024-03-12 ENCOUNTER — Other Ambulatory Visit: Payer: Self-pay

## 2024-03-12 DIAGNOSIS — R112 Nausea with vomiting, unspecified: Secondary | ICD-10-CM | POA: Diagnosis not present

## 2024-03-12 DIAGNOSIS — F1010 Alcohol abuse, uncomplicated: Secondary | ICD-10-CM | POA: Diagnosis not present

## 2024-03-12 DIAGNOSIS — F1090 Alcohol use, unspecified, uncomplicated: Secondary | ICD-10-CM

## 2024-03-12 LAB — CBC WITH DIFFERENTIAL/PLATELET
Abs Immature Granulocytes: 0.02 10*3/uL (ref 0.00–0.07)
Basophils Absolute: 0.1 10*3/uL (ref 0.0–0.1)
Basophils Relative: 1 %
Eosinophils Absolute: 0 10*3/uL (ref 0.0–0.5)
Eosinophils Relative: 0 %
HCT: 48 % (ref 39.0–52.0)
Hemoglobin: 15.9 g/dL (ref 13.0–17.0)
Immature Granulocytes: 0 %
Lymphocytes Relative: 6 %
Lymphs Abs: 0.7 10*3/uL (ref 0.7–4.0)
MCH: 32.2 pg (ref 26.0–34.0)
MCHC: 33.1 g/dL (ref 30.0–36.0)
MCV: 97.2 fL (ref 80.0–100.0)
Monocytes Absolute: 0.9 10*3/uL (ref 0.1–1.0)
Monocytes Relative: 9 %
Neutro Abs: 8.9 10*3/uL — ABNORMAL HIGH (ref 1.7–7.7)
Neutrophils Relative %: 84 %
Platelets: 237 10*3/uL (ref 150–400)
RBC: 4.94 MIL/uL (ref 4.22–5.81)
RDW: 14.1 % (ref 11.5–15.5)
WBC: 10.6 10*3/uL — ABNORMAL HIGH (ref 4.0–10.5)
nRBC: 0 % (ref 0.0–0.2)

## 2024-03-12 LAB — COMPREHENSIVE METABOLIC PANEL WITH GFR
ALT: 107 U/L — ABNORMAL HIGH (ref 0–44)
AST: 105 U/L — ABNORMAL HIGH (ref 15–41)
Albumin: 5.1 g/dL — ABNORMAL HIGH (ref 3.5–5.0)
Alkaline Phosphatase: 82 U/L (ref 38–126)
Anion gap: 18 — ABNORMAL HIGH (ref 5–15)
BUN: 8 mg/dL (ref 6–20)
CO2: 23 mmol/L (ref 22–32)
Calcium: 10.4 mg/dL — ABNORMAL HIGH (ref 8.9–10.3)
Chloride: 99 mmol/L (ref 98–111)
Creatinine, Ser: 0.74 mg/dL (ref 0.61–1.24)
GFR, Estimated: >60 mL/min (ref >60)
Glucose, Bld: 110 mg/dL — ABNORMAL HIGH (ref 70–99)
Potassium: 3.6 mmol/L (ref 3.5–5.1)
Sodium: 140 mmol/L (ref 135–145)
Total Bilirubin: 2.3 mg/dL — ABNORMAL HIGH (ref 0.0–1.2)
Total Protein: 8.7 g/dL — ABNORMAL HIGH (ref 6.5–8.1)

## 2024-03-12 LAB — LIPASE, BLOOD: Lipase: 29 U/L (ref 11–51)

## 2024-03-12 MED ORDER — DROPERIDOL 2.5 MG/ML IJ SOLN
2.5000 mg | Freq: Once | INTRAMUSCULAR | Status: AC
  Administered 2024-03-12: 2.5 mg via INTRAVENOUS
  Filled 2024-03-12: qty 2

## 2024-03-12 MED ORDER — LACTATED RINGERS IV BOLUS
1000.0000 mL | Freq: Once | INTRAVENOUS | Status: AC
  Administered 2024-03-12: 1000 mL via INTRAVENOUS

## 2024-03-12 MED ORDER — DIPHENHYDRAMINE HCL 50 MG/ML IJ SOLN
25.0000 mg | Freq: Once | INTRAMUSCULAR | Status: AC
  Administered 2024-03-12: 25 mg via INTRAVENOUS
  Filled 2024-03-12: qty 1

## 2024-03-12 MED ORDER — ONDANSETRON HCL 4 MG PO TABS: 4.0000 mg | ORAL_TABLET | Freq: Four times a day (QID) | ORAL | 0 refills | Status: DC | PRN

## 2024-03-12 MED ORDER — FAMOTIDINE IN NACL 20-0.9 MG/50ML-% IV SOLN
20.0000 mg | Freq: Once | INTRAVENOUS | Status: AC
  Administered 2024-03-12: 20 mg via INTRAVENOUS
  Filled 2024-03-12: qty 50

## 2024-03-12 NOTE — Discharge Instructions (Addendum)
 It was a pleasure taking care of you today.  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  Stopping smoking marijuana is the only way to fix the cannabis hyperemesis that I can take weeks to fully resolve.  I am prescribing you some nausea medicine and I advised you to follow closely with the GI doctors.  Come back to the ER if you have new or worsening symptoms.

## 2024-03-12 NOTE — ED Triage Notes (Signed)
 Pt arrived via POV c/o recurrent hyperemesis that began this morning. Pt reports trying OTC medications with prescribed ODT Zofran  w/o relief.

## 2024-03-12 NOTE — ED Notes (Addendum)
 Patient discharged. Provider spoke to patient. Paperwork given to patient and reviewed. Pt verbalized understanding. VSS. A+Ox4. Patient ambulated out of the ER with steady, independent gait. Iv removed intact without complications. Patient feeling better.

## 2024-03-12 NOTE — ED Provider Notes (Signed)
 [place] EMERGENCY DEPARTMENT AT [facility] Provider Note   CSN: [id number] Arrival date & time: 03/12/24  1222     History  Chief Complaint  Patient presents with   Emesis    Timothy [name] is a 28 y.o. male.  He has history of alcohol abuse and cannabis use disorder.  States he drinks most days and smokes marijuana every day as well.  States he drank 4 tall cans of beer yesterday.  Today for today, too numerous to count, no diarrhea or abdominal pain.  States he is now vomiting up yellow stuff" no hematemesis, no changes in bowel habits or blood in stool.  He has been told in the past that symptoms though he related to alcohol intake and cannabis hyperemesis syndrome.  He states he knows he needs to quit but has not been able to as of yet.   Emesis      Home Medications Prior to Admission medications   Medication Sig Start Date End Date Taking? Authorizing Provider  chlordiazePOXIDE  (LIBRIUM ) 25 MG capsule 50mg  PO TID x 1D, then 25-50mg  PO BID X 1D, then 25-50mg  PO QD X 1D 06/16/23   [place] Hawker A, PA-C  metoCLOPramide  (REGLAN ) 10 MG tablet Take 1 tablet (10 mg total) by mouth every 6 (six) hours. 08/14/23   Ballard Bongo, MD  omeprazole  (PRILOSEC ) 20 MG capsule Take 1 capsule (20 mg total) by mouth daily. 08/01/22   Triplett, Tammy, PA-C  ondansetron  (ZOFRAN ) 4 MG tablet Take 1 tablet (4 mg total) by mouth every 6 (six) hours. As needed for nausea vomiting 07/12/23   Triplett, Tammy, PA-C  ondansetron  (ZOFRAN -ODT) 4 MG disintegrating tablet Take 1 tablet (4 mg total) by mouth every 8 (eight) hours as needed for nausea or vomiting. 02/14/24   Mandy Second, PA-C  pantoprazole  (PROTONIX ) 40 MG tablet Take 1 tablet (40 mg total) by mouth daily. 06/16/23   Baxter Limber A, PA-C  potassium chloride  SA (KLOR-CON  M) 20 MEQ tablet Take 1 tablet (20 mEq total) by mouth 2 (two) times daily. 02/14/24   Mandy Second, PA-C  prochlorperazine  (COMPAZINE ) 10 MG tablet  Take 1 tablet (10 mg total) by mouth 2 (two) times daily as needed for nausea or vomiting. 02/14/24   Mandy Second, PA-C  promethazine  (PHENERGAN ) 25 MG suppository Place 1 suppository (25 mg total) rectally every 6 (six) hours as needed for nausea or vomiting. 06/17/23   Smoot, Genevive Ket, PA-C      Allergies    Amoxil [amoxicillin]    Review of Systems   Review of Systems  Gastrointestinal:  Positive for vomiting.    Physical Exam Updated Vital Signs BP (!) 151/101 (BP Location: Right Arm)   Pulse 100   Temp 98.1 F (36.7 C) (Oral)   Resp 16   Ht 5\' 4"  (1.626 m)   Wt 72.6 kg   SpO2 100%   BMI 27.47 kg/m  Physical Exam Vitals and nursing note reviewed.  Constitutional:      General: He is not in acute distress.    Appearance: He is well-developed.     Comments: Actively vomiting standing over the sink.   HENT:     Head: Normocephalic and atraumatic.     Mouth/Throat:     Mouth: Mucous membranes are moist.  Eyes:     Conjunctiva/sclera: Conjunctivae normal.  Cardiovascular:     Rate and Rhythm: Normal rate and regular rhythm.     Heart sounds: No murmur heard.  Pulmonary:     Effort: Pulmonary effort is normal. No respiratory distress.     Breath sounds: Normal breath sounds.  Abdominal:     Palpations: Abdomen is soft.     Tenderness: There is no abdominal tenderness.  Musculoskeletal:        General: No swelling.     Cervical back: Neck supple.  Skin:    General: Skin is warm and dry.     Capillary Refill: Capillary refill takes less than 2 seconds.  Neurological:     General: No focal deficit present.     Mental Status: He is alert and oriented to person, place, and time.     Motor: No weakness.     Gait: Gait normal.  Psychiatric:        Mood and Affect: Mood normal.     ED Results / Procedures / Treatments   Labs (all labs ordered are listed, but only abnormal results are displayed) Labs Reviewed  COMPREHENSIVE METABOLIC PANEL WITH GFR - Abnormal;  Notable for the following components:      Result Value   Glucose, Bld 110 (*)    Calcium 10.4 (*)    Total Protein 8.7 (*)    Albumin 5.1 (*)    AST 105 (*)    ALT 107 (*)    Total Bilirubin 2.3 (*)    Anion gap 18 (*)    All other components within normal limits  CBC WITH DIFFERENTIAL/PLATELET - Abnormal; Notable for the following components:   WBC 10.6 (*)    Neutro Abs 8.9 (*)    All other components within normal limits  LIPASE, BLOOD    EKG None  Radiology No results found.  Procedures Procedures    Medications Ordered in ED Medications - No data to display  ED Course/ Medical Decision Making/ A&P                                 Medical Decision Making This patient presents to the ED for concern of nausea and vomiting, this involves an extensive number of treatment options, and is a complaint that carries with it a high risk of complications and morbidity.  The differential diagnosis includes gastritis, gastroenteritis, peptic ulcer disease, pancreatitis, cannabis hyperemesis syndrome, alcoholic gastritis, other   Co morbidities that complicate the patient evaluation  Cannabis use, alcohol use   Additional history obtained:  Additional history obtained from EMR External records from outside source obtained and reviewed including prior notes and labs   Lab Tests:  I Ordered, and personally interpreted labs.  The pertinent results include: CBC-mild leukocytosis white blood cell count 10.6 CMP shows mild hypercalcemia 10.4 similar to previous, likely from dehydration, AST and ALT and bilirubin elevated-again similar to previous Lipase-normal   Problem List / ED Course / Critical interventions / Medication management  Vomiting-patient states that this is consistent with prior episodes thought to be due to drinking alcohol and smoking marijuana.  He has had multiple visits for the same, review of those notes, usually improves with nausea medicine and  fluids.  Given nausea medicine and fluids today, he is feeling better, eating crackers and drinking fluids.  Labs are overall reassuring, unchanged from previous, he did have a slightly increased anion gap but CO2 was normal likely due to the vomiting.  Discussed need for outpatient follow-up with GI and strict return precautions. I ordered medication including Benadryl , droperidol , famotidine  and  IV fluids for nausea and vomiting Reevaluation of the patient after these medicines showed that the patient improved with sleeping comfortably on my reassessment, when I gently touched him he woke up and was feeling much better I have reviewed the patients home medicines and have made adjustments as needed  Patient uses alcohol daily and marijuana daily, he is interesting in the thought of quitting and was given information to follow-up with [facility] if he would like to pursue this.  He states he has seen him in the past.      Amount and/or Complexity of Data Reviewed Labs: ordered.  Risk Prescription drug management.          Final Clinical Impression(s) / ED Diagnoses Final diagnoses:  None    Rx / DC Orders ED Discharge Orders     None         Joshua Nieves 03/12/24 Melven Stable, MD 03/14/24 1303

## 2024-03-12 NOTE — ED Notes (Signed)
 Called for Pt from waiting area. Pt in bathroom

## 2024-03-12 NOTE — ED Notes (Signed)
 Pt reports last taking ODT Zofran  at 0900 this morning PTA.

## 2024-04-11 ENCOUNTER — Encounter (HOSPITAL_COMMUNITY): Payer: Self-pay | Admitting: Emergency Medicine

## 2024-04-11 ENCOUNTER — Inpatient Hospital Stay (HOSPITAL_COMMUNITY)
Admission: EM | Admit: 2024-04-11 | Discharge: 2024-04-14 | DRG: 082 | Disposition: A | Discharge status: Home / Self Care | Attending: Pulmonary Disease | Admitting: Pulmonary Disease

## 2024-04-11 ENCOUNTER — Other Ambulatory Visit: Payer: Self-pay

## 2024-04-11 ENCOUNTER — Inpatient Hospital Stay (HOSPITAL_COMMUNITY)

## 2024-04-11 ENCOUNTER — Emergency Department (HOSPITAL_COMMUNITY)

## 2024-04-11 DIAGNOSIS — Y92512 Supermarket, store or market as the place of occurrence of the external cause: Secondary | ICD-10-CM | POA: Diagnosis not present

## 2024-04-11 DIAGNOSIS — Z79899 Other long term (current) drug therapy: Secondary | ICD-10-CM

## 2024-04-11 DIAGNOSIS — F1290 Cannabis use, unspecified, uncomplicated: Secondary | ICD-10-CM | POA: Diagnosis not present

## 2024-04-11 DIAGNOSIS — Z880 Allergy status to penicillin: Secondary | ICD-10-CM

## 2024-04-11 DIAGNOSIS — Z452 Encounter for adjustment and management of vascular access device: Secondary | ICD-10-CM | POA: Diagnosis not present

## 2024-04-11 DIAGNOSIS — F1210 Cannabis abuse, uncomplicated: Secondary | ICD-10-CM | POA: Diagnosis present

## 2024-04-11 DIAGNOSIS — S065X0A Traumatic subdural hemorrhage without loss of consciousness, initial encounter: Secondary | ICD-10-CM | POA: Diagnosis not present

## 2024-04-11 DIAGNOSIS — W19XXXA Unspecified fall, initial encounter: Secondary | ICD-10-CM | POA: Diagnosis not present

## 2024-04-11 DIAGNOSIS — W1830XA Fall on same level, unspecified, initial encounter: Secondary | ICD-10-CM | POA: Diagnosis present

## 2024-04-11 DIAGNOSIS — Z4682 Encounter for fitting and adjustment of non-vascular catheter: Secondary | ICD-10-CM | POA: Diagnosis not present

## 2024-04-11 DIAGNOSIS — S065XAA Traumatic subdural hemorrhage with loss of consciousness status unknown, initial encounter: Principal | ICD-10-CM | POA: Diagnosis present

## 2024-04-11 DIAGNOSIS — F10139 Alcohol abuse with withdrawal, unspecified: Secondary | ICD-10-CM | POA: Diagnosis not present

## 2024-04-11 DIAGNOSIS — K7200 Acute and subacute hepatic failure without coma: Secondary | ICD-10-CM | POA: Diagnosis not present

## 2024-04-11 DIAGNOSIS — S0093XA Contusion of unspecified part of head, initial encounter: Secondary | ICD-10-CM | POA: Diagnosis not present

## 2024-04-11 DIAGNOSIS — R1111 Vomiting without nausea: Secondary | ICD-10-CM | POA: Diagnosis not present

## 2024-04-11 DIAGNOSIS — S0990XA Unspecified injury of head, initial encounter: Secondary | ICD-10-CM | POA: Diagnosis not present

## 2024-04-11 DIAGNOSIS — F17210 Nicotine dependence, cigarettes, uncomplicated: Secondary | ICD-10-CM | POA: Diagnosis present

## 2024-04-11 DIAGNOSIS — Z833 Family history of diabetes mellitus: Secondary | ICD-10-CM

## 2024-04-11 DIAGNOSIS — J9601 Acute respiratory failure with hypoxia: Secondary | ICD-10-CM | POA: Diagnosis not present

## 2024-04-11 DIAGNOSIS — E876 Hypokalemia: Secondary | ICD-10-CM | POA: Diagnosis present

## 2024-04-11 DIAGNOSIS — G40901 Epilepsy, unspecified, not intractable, with status epilepticus: Secondary | ICD-10-CM | POA: Diagnosis not present

## 2024-04-11 DIAGNOSIS — R001 Bradycardia, unspecified: Secondary | ICD-10-CM | POA: Diagnosis not present

## 2024-04-11 DIAGNOSIS — F1090 Alcohol use, unspecified, uncomplicated: Secondary | ICD-10-CM | POA: Diagnosis not present

## 2024-04-11 DIAGNOSIS — R404 Transient alteration of awareness: Secondary | ICD-10-CM | POA: Diagnosis not present

## 2024-04-11 DIAGNOSIS — I6200 Nontraumatic subdural hemorrhage, unspecified: Secondary | ICD-10-CM | POA: Diagnosis not present

## 2024-04-11 DIAGNOSIS — Y901 Blood alcohol level of 20-39 mg/100 ml: Secondary | ICD-10-CM | POA: Diagnosis present

## 2024-04-11 DIAGNOSIS — F1010 Alcohol abuse, uncomplicated: Secondary | ICD-10-CM | POA: Diagnosis not present

## 2024-04-11 DIAGNOSIS — S199XXA Unspecified injury of neck, initial encounter: Secondary | ICD-10-CM | POA: Diagnosis not present

## 2024-04-11 DIAGNOSIS — I959 Hypotension, unspecified: Secondary | ICD-10-CM | POA: Diagnosis not present

## 2024-04-11 DIAGNOSIS — R569 Unspecified convulsions: Principal | ICD-10-CM

## 2024-04-11 DIAGNOSIS — Z743 Need for continuous supervision: Secondary | ICD-10-CM | POA: Diagnosis not present

## 2024-04-11 DIAGNOSIS — R410 Disorientation, unspecified: Secondary | ICD-10-CM | POA: Diagnosis not present

## 2024-04-11 HISTORY — DX: Other psychoactive substance use, unspecified with withdrawal, unspecified: F19.939

## 2024-04-11 HISTORY — DX: Other psychoactive substance use, unspecified with withdrawal, unspecified: R56.9

## 2024-04-11 LAB — BLOOD GAS, ARTERIAL
Acid-base deficit: 4 mmol/L — ABNORMAL HIGH (ref 0.0–2.0)
Bicarbonate: 20.9 mmol/L (ref 20.0–28.0)
Drawn by: 10555
O2 Saturation: 99.7 %
Patient temperature: 37
pCO2 arterial: 37 mmHg (ref 32–48)
pH, Arterial: 7.36 (ref 7.35–7.45)
pO2, Arterial: 130 mmHg — ABNORMAL HIGH (ref 83–108)

## 2024-04-11 LAB — CBC WITH DIFFERENTIAL/PLATELET
Abs Immature Granulocytes: 0.31 10*3/uL — ABNORMAL HIGH (ref 0.00–0.07)
Basophils Absolute: 0.1 10*3/uL (ref 0.0–0.1)
Basophils Relative: 1 %
Eosinophils Absolute: 0.2 10*3/uL (ref 0.0–0.5)
Eosinophils Relative: 2 %
HCT: 52.3 % — ABNORMAL HIGH (ref 39.0–52.0)
Hemoglobin: 15.9 g/dL (ref 13.0–17.0)
Immature Granulocytes: 3 %
Lymphocytes Relative: 43 %
Lymphs Abs: 5.5 10*3/uL — ABNORMAL HIGH (ref 0.7–4.0)
MCH: 33.3 pg (ref 26.0–34.0)
MCHC: 30.4 g/dL (ref 30.0–36.0)
MCV: 109.4 fL — ABNORMAL HIGH (ref 80.0–100.0)
Monocytes Absolute: 1.6 10*3/uL — ABNORMAL HIGH (ref 0.1–1.0)
Monocytes Relative: 13 %
Neutro Abs: 4.8 10*3/uL (ref 1.7–7.7)
Neutrophils Relative %: 38 %
Platelets: 249 10*3/uL (ref 150–400)
RBC: 4.78 MIL/uL (ref 4.22–5.81)
RDW: 13.8 % (ref 11.5–15.5)
WBC: 12.6 10*3/uL — ABNORMAL HIGH (ref 4.0–10.5)
nRBC: 0.2 % (ref 0.0–0.2)

## 2024-04-11 LAB — COMPREHENSIVE METABOLIC PANEL WITH GFR
ALT: 126 U/L — ABNORMAL HIGH (ref 0–44)
AST: 115 U/L — ABNORMAL HIGH (ref 15–41)
Albumin: 5.1 g/dL — ABNORMAL HIGH (ref 3.5–5.0)
Alkaline Phosphatase: 96 U/L (ref 38–126)
BUN: 6 mg/dL (ref 6–20)
CO2: <7 mmol/L — ABNORMAL LOW (ref 22–32)
Calcium: 10.4 mg/dL — ABNORMAL HIGH (ref 8.9–10.3)
Chloride: 106 mmol/L (ref 98–111)
Creatinine, Ser: 1.07 mg/dL (ref 0.61–1.24)
GFR, Estimated: >60 mL/min (ref >60)
Glucose, Bld: 124 mg/dL — ABNORMAL HIGH (ref 70–99)
Potassium: 2.9 mmol/L — ABNORMAL LOW (ref 3.5–5.1)
Sodium: 137 mmol/L (ref 135–145)
Total Bilirubin: 1.5 mg/dL — ABNORMAL HIGH (ref 0.0–1.2)
Total Protein: 8.7 g/dL — ABNORMAL HIGH (ref 6.5–8.1)

## 2024-04-11 LAB — TROPONIN I (HIGH SENSITIVITY)
Troponin I (High Sensitivity): 10 ng/L (ref <18)
Troponin I (High Sensitivity): 9 ng/L (ref <18)

## 2024-04-11 LAB — RAPID URINE DRUG SCREEN, HOSP PERFORMED
Amphetamines: NOT DETECTED
Barbiturates: NOT DETECTED
Benzodiazepines: POSITIVE — AB
Cocaine: NOT DETECTED
Opiates: NOT DETECTED
Tetrahydrocannabinol: POSITIVE — AB

## 2024-04-11 LAB — ETHANOL: Alcohol, Ethyl (B): 38 mg/dL — ABNORMAL HIGH (ref <15)

## 2024-04-11 MED ORDER — PROPOFOL 1000 MG/100ML IV EMUL
0.0000 ug/kg/min | INTRAVENOUS | Status: DC
  Administered 2024-04-11: 10 ug/kg/min via INTRAVENOUS
  Administered 2024-04-11: 55 ug/kg/min via INTRAVENOUS
  Administered 2024-04-12 (×2): 65 ug/kg/min via INTRAVENOUS
  Administered 2024-04-12: 20 ug/kg/min via INTRAVENOUS
  Administered 2024-04-12: 35 ug/kg/min via INTRAVENOUS
  Administered 2024-04-12: 65 ug/kg/min via INTRAVENOUS
  Administered 2024-04-13: 10 ug/kg/min via INTRAVENOUS
  Filled 2024-04-11 (×2): qty 100
  Filled 2024-04-11: qty 200
  Filled 2024-04-11 (×5): qty 100

## 2024-04-11 MED ORDER — POTASSIUM CHLORIDE 10 MEQ/100ML IV SOLN
10.0000 meq | Freq: Once | INTRAVENOUS | Status: AC
  Administered 2024-04-11: 10 meq via INTRAVENOUS
  Filled 2024-04-11: qty 100

## 2024-04-11 MED ORDER — LORAZEPAM 2 MG/ML IJ SOLN
INTRAMUSCULAR | Status: AC
Start: 2024-04-11 — End: 2024-04-11
  Administered 2024-04-11: 2 mg
  Filled 2024-04-11: qty 1

## 2024-04-11 MED ORDER — POTASSIUM CHLORIDE 10 MEQ/100ML IV SOLN
10.0000 meq | Freq: Once | INTRAVENOUS | Status: AC
  Filled 2024-04-11: qty 100

## 2024-04-11 MED ORDER — LEVETIRACETAM (KEPPRA) 500 MG/5 ML ADULT IV PUSH
2000.0000 mg | Freq: Once | INTRAVENOUS | Status: AC
  Administered 2024-04-11: 2000 mg via INTRAVENOUS
  Filled 2024-04-11: qty 20

## 2024-04-11 MED ORDER — SUCCINYLCHOLINE CHLORIDE 200 MG/10ML IV SOSY
50.0000 mg | PREFILLED_SYRINGE | Freq: Once | INTRAVENOUS | Status: AC
  Administered 2024-04-11: 50 mg via INTRAVENOUS

## 2024-04-11 MED ORDER — SODIUM CHLORIDE 0.9 % IV BOLUS
2000.0000 mL | Freq: Once | INTRAVENOUS | Status: AC
  Administered 2024-04-11: 2000 mL via INTRAVENOUS

## 2024-04-11 MED ORDER — ETOMIDATE 2 MG/ML IV SOLN: 20.0000 mg | Freq: Once | INTRAVENOUS | Status: AC

## 2024-04-11 MED ORDER — ETOMIDATE 2 MG/ML IV SOLN
INTRAVENOUS | Status: AC
  Administered 2024-04-11: 20 mg via INTRAVENOUS
  Filled 2024-04-11: qty 10

## 2024-04-11 MED ORDER — LORAZEPAM 2 MG/ML IJ SOLN: 2.0000 mg | INTRAMUSCULAR | Status: AC

## 2024-04-11 MED ORDER — LORAZEPAM 2 MG/ML IJ SOLN
INTRAMUSCULAR | Status: AC
  Administered 2024-04-11: 2 mg via INTRAVENOUS
  Filled 2024-04-11: qty 1

## 2024-04-11 MED ORDER — FENTANYL 2500MCG IN NS 250ML (10MCG/ML) PREMIX INFUSION
0.0000 ug/h | INTRAVENOUS | Status: DC
  Administered 2024-04-12: 100 ug/h via INTRAVENOUS
  Administered 2024-04-13: 300 ug/h via INTRAVENOUS
  Filled 2024-04-11 (×3): qty 250

## 2024-04-11 MED ORDER — SUCCINYLCHOLINE CHLORIDE 200 MG/10ML IV SOSY
PREFILLED_SYRINGE | INTRAVENOUS | Status: AC
  Administered 2024-04-11: 120 mg via INTRAVENOUS
  Filled 2024-04-11: qty 10

## 2024-04-11 MED ORDER — SUCCINYLCHOLINE CHLORIDE 200 MG/10ML IV SOSY: 120.0000 mg | PREFILLED_SYRINGE | Freq: Once | INTRAVENOUS | Status: AC

## 2024-04-11 NOTE — ED Notes (Signed)
 Pt HR dropped 39. EKG done and MD made aware

## 2024-04-11 NOTE — ED Triage Notes (Addendum)
 Pt BIB [facility] for multiple seizures at [place], ETOH on board, pt walked to truck, but began having more seizures enroute to ED. Per EMS pt struck back on head, hematoma.  Given 2.5 mg of Versed by EMS

## 2024-04-11 NOTE — ED Notes (Signed)
 Pt seizing at this time , MD made aware. Verbal to give 2mg  of ativan 

## 2024-04-11 NOTE — ED Notes (Signed)
 Pt sats 68 % . Pt placed on 15L NRB

## 2024-04-11 NOTE — ED Notes (Signed)
 MD at bedside w/ respiratory to intubate pt

## 2024-04-11 NOTE — ED Notes (Addendum)
 Pt intubated, positive color change noted. Pt is 24 @ the lip. Bilateral breath sounds noted

## 2024-04-11 NOTE — ED Provider Notes (Signed)
 Timothy [name] Provider Note   CSN: [id number] Arrival date & time: 04/11/24  1746     History {Add pertinent medical, surgical, social history, OB history to HPI:1} Chief Complaint  Patient presents with   Seizures    Timothy [name] is a 28 y.o. male.  Patient has a history of seizures and EtOH abuse.  Patient had a seizure in the store.  He hit his head.  Patient was alert when the paramedics arrived and complained of some headache.  While patient was being transported to the emergency department he had a seizure again and was given some Versed.  In the emergency department he had at least 2 more seizures.  He was loaded with Keppra and given Ativan  IV.  Patient was not protecting his airway and needed to be intubated.  The history is provided by the EMS personnel. No language interpreter was used.  Seizures Seizure activity on arrival: yes   Seizure type:  Grand mal Preceding symptoms: no sensation of an aura present   Initial focality:  Multifocal Episode characteristics: abnormal movements   Postictal symptoms: confusion   Return to baseline: no   Severity:  Severe Timing:  Clustered Progression:  Resolved Context: alcohol withdrawal        Home Medications Prior to Admission medications   Medication Sig Start Date End Date Taking? Authorizing Provider  chlordiazePOXIDE  (LIBRIUM ) 25 MG capsule 50mg  PO TID x 1D, then 25-50mg  PO BID X 1D, then 25-50mg  PO QD X 1D Patient not taking: Reported on 04/11/2024 06/16/23   Baxter Limber A, PA-C  metoCLOPramide  (REGLAN ) 10 MG tablet Take 1 tablet (10 mg total) by mouth every 6 (six) hours. Patient not taking: Reported on 04/11/2024 08/14/23   Ballard Bongo, MD  omeprazole  (PRILOSEC ) 20 MG capsule Take 1 capsule (20 mg total) by mouth daily. Patient not taking: Reported on 04/11/2024 08/01/22   Triplett, Benn Brash, PA-C  ondansetron  (ZOFRAN ) 4 MG tablet Take 1 tablet (4 mg total) by  mouth every 6 (six) hours as needed for nausea or vomiting. As needed for nausea vomiting Patient not taking: Reported on 04/11/2024 03/12/24   Baxter Limber A, PA-C  ondansetron  (ZOFRAN -ODT) 4 MG disintegrating tablet Take 1 tablet (4 mg total) by mouth every 8 (eight) hours as needed for nausea or vomiting. Patient not taking: Reported on 04/11/2024 02/14/24   Mandy Second, PA-C  pantoprazole  (PROTONIX ) 40 MG tablet Take 1 tablet (40 mg total) by mouth daily. Patient not taking: Reported on 04/11/2024 06/16/23   Baxter Limber A, PA-C  potassium chloride  SA (KLOR-CON  M) 20 MEQ tablet Take 1 tablet (20 mEq total) by mouth 2 (two) times daily. Patient not taking: Reported on 04/11/2024 02/14/24   Mandy Second, PA-C  prochlorperazine  (COMPAZINE ) 10 MG tablet Take 1 tablet (10 mg total) by mouth 2 (two) times daily as needed for nausea or vomiting. Patient not taking: Reported on 04/11/2024 02/14/24   Mandy Second, PA-C  promethazine  (PHENERGAN ) 25 MG suppository Place 1 suppository (25 mg total) rectally every 6 (six) hours as needed for nausea or vomiting. Patient not taking: Reported on 04/11/2024 06/17/23   Smoot, Genevive Ket, PA-C      Allergies    Amoxil [amoxicillin]    Review of Systems   Review of Systems  Unable to perform ROS: Mental status change  Neurological:  Positive for seizures.    Physical Exam Updated Vital Signs BP (!) 131/94   Pulse (!) 102  Resp 20   Ht 5\' 4"  (1.626 m)   Wt 72.6 kg   SpO2 100%   BMI 27.47 kg/m  Physical Exam Vitals and nursing note reviewed.  Constitutional:      Appearance: He is well-developed.     Comments: Lethargic  HENT:     Head: Normocephalic.     Nose: Nose normal.     Mouth/Throat:     Comments: Blood in his mouth Eyes:     General: No scleral icterus.    Conjunctiva/sclera: Conjunctivae normal.  Neck:     Thyroid: No thyromegaly.  Cardiovascular:     Rate and Rhythm: Normal rate and regular rhythm.     Heart sounds: No  murmur heard.    No friction rub. No gallop.  Pulmonary:     Breath sounds: No stridor. No wheezing or rales.  Chest:     Chest wall: No tenderness.  Abdominal:     General: There is no distension.     Tenderness: There is no abdominal tenderness. There is no rebound.  Musculoskeletal:        General: Normal range of motion.     Cervical back: Neck supple.  Lymphadenopathy:     Cervical: No cervical adenopathy.  Skin:    Findings: No erythema or rash.  Neurological:     Motor: No abnormal muscle tone.     Coordination: Coordination normal.     Comments: Very lethargic not responding to painful stimuli     ED Results / Procedures / Treatments   Labs (all labs ordered are listed, but only abnormal results are displayed) Labs Reviewed  CBC WITH DIFFERENTIAL/PLATELET - Abnormal; Notable for the following components:      Result Value   WBC 12.6 (*)    HCT 52.3 (*)    MCV 109.4 (*)    Lymphs Abs 5.5 (*)    Monocytes Absolute 1.6 (*)    Abs Immature Granulocytes 0.31 (*)    All other components within normal limits  COMPREHENSIVE METABOLIC PANEL WITH GFR - Abnormal; Notable for the following components:   Potassium 2.9 (*)    CO2 <7 (*)    Glucose, Bld 124 (*)    Calcium 10.4 (*)    Total Protein 8.7 (*)    Albumin 5.1 (*)    AST 115 (*)    ALT 126 (*)    Total Bilirubin 1.5 (*)    All other components within normal limits  ETHANOL - Abnormal; Notable for the following components:   Alcohol, Ethyl (B) 38 (*)    All other components within normal limits  RAPID URINE DRUG SCREEN, HOSP PERFORMED  BLOOD GAS, ARTERIAL  TROPONIN I (HIGH SENSITIVITY)  TROPONIN I (HIGH SENSITIVITY)    EKG None  Radiology CT Head Wo Contrast Result Date: 04/11/2024 CLINICAL DATA:  Neck trauma with impaired range of motion. ETOH on board. Multiple seizures at [employer]. Per EMS patient struck back of head with a hematoma. EXAM: CT HEAD WITHOUT CONTRAST CT CERVICAL SPINE WITHOUT CONTRAST  TECHNIQUE: Multidetector CT imaging of the head and cervical spine was performed following the standard protocol without intravenous contrast. Multiplanar CT image reconstructions of the cervical spine were also generated. RADIATION DOSE REDUCTION: This exam was performed according to the departmental dose-optimization program which includes automated exposure control, adjustment of the mA and/or kV according to patient size and/or use of iterative reconstruction technique. COMPARISON:  CT head 07/29/2019 FINDINGS: CT HEAD FINDINGS Brain: Trace hyperdensity along the  anterior falx measuring 1-2 mm in thickness (circa series 1/image 11) compatible with small subdural hematoma. No adjacent mass effect. No midline shift. No additional hemorrhage. No evidence of acute infarct. No hydrocephalus. Vascular: No hyperdense vessel or unexpected calcification. Skull: No calvarial fracture. Sinuses/Orbits: Globes are intact. Paranasal sinuses and mastoid air cells are well aerated. Other: None. TRAUMATIC BRAIN INJURY RISK STRATIFICATION Skull Fracture: No - Low/mBIG 1 Subdural Hematoma (SDH): <83mm - mBIG 1 Subarachnoid Hemorrhage [name]): No Epidural Hematoma (EDH): No - Low/mBIG 1 Cerebral contusion, intra-axial, intraparenchymal Hemorrhage (IPH): No Intraventricular Hemorrhage (IVH): No - Low/mBIG 1 Midline Shift > 1mm or Edema/effacement of sulci/vents: No - Low/mBIG 1 ---------------------------------------------------- CT CERVICAL SPINE FINDINGS Alignment: No evidence of traumatic listhesis. Loss of lordosis is likely positional. Skull base and vertebrae: No acute fracture. Soft tissues and spinal canal: No prevertebral fluid or swelling. No visible canal hematoma. Disc levels: Intervertebral disc space height is maintained. No spinal canal narrowing. Upper chest: No acute abnormality. Other: Partially visualized endotracheal tube. IMPRESSION: 1. Trace subdural hematoma along the anterior falx measuring 1-2 mm in thickness.  No adjacent mass effect. (See above for traumatic brain injury risk stratification) 2. No acute fracture or traumatic listhesis in the cervical spine. Critical Value/emergent results were called by telephone at the time of interpretation on 04/11/2024 at 8:44 pm to provider [place] Sloop , who verbally acknowledged these results. Electronically Signed   By: Rozell Cornet M.D.   On: 04/11/2024 20:48   CT Cervical Spine Wo Contrast Result Date: 04/11/2024 CLINICAL DATA:  Neck trauma with impaired range of motion. ETOH on board. Multiple seizures at [employer]. Per EMS patient struck back of head with a hematoma. EXAM: CT HEAD WITHOUT CONTRAST CT CERVICAL SPINE WITHOUT CONTRAST TECHNIQUE: Multidetector CT imaging of the head and cervical spine was performed following the standard protocol without intravenous contrast. Multiplanar CT image reconstructions of the cervical spine were also generated. RADIATION DOSE REDUCTION: This exam was performed according to the departmental dose-optimization program which includes automated exposure control, adjustment of the mA and/or kV according to patient size and/or use of iterative reconstruction technique. COMPARISON:  CT head 07/29/2019 FINDINGS: CT HEAD FINDINGS Brain: Trace hyperdensity along the anterior falx measuring 1-2 mm in thickness (circa series 1/image 11) compatible with small subdural hematoma. No adjacent mass effect. No midline shift. No additional hemorrhage. No evidence of acute infarct. No hydrocephalus. Vascular: No hyperdense vessel or unexpected calcification. Skull: No calvarial fracture. Sinuses/Orbits: Globes are intact. Paranasal sinuses and mastoid air cells are well aerated. Other: None. TRAUMATIC BRAIN INJURY RISK STRATIFICATION Skull Fracture: No - Low/mBIG 1 Subdural Hematoma (SDH): <60mm - mBIG 1 Subarachnoid Hemorrhage [facility]): No Epidural Hematoma (EDH): No - Low/mBIG 1 Cerebral contusion, intra-axial, intraparenchymal Hemorrhage (IPH): No  Intraventricular Hemorrhage (IVH): No - Low/mBIG 1 Midline Shift > 1mm or Edema/effacement of sulci/vents: No - Low/mBIG 1 ---------------------------------------------------- CT CERVICAL SPINE FINDINGS Alignment: No evidence of traumatic listhesis. Loss of lordosis is likely positional. Skull base and vertebrae: No acute fracture. Soft tissues and spinal canal: No prevertebral fluid or swelling. No visible canal hematoma. Disc levels: Intervertebral disc space height is maintained. No spinal canal narrowing. Upper chest: No acute abnormality. Other: Partially visualized endotracheal tube. IMPRESSION: 1. Trace subdural hematoma along the anterior falx measuring 1-2 mm in thickness. No adjacent mass effect. (See above for traumatic brain injury risk stratification) 2. No acute fracture or traumatic listhesis in the cervical spine. Critical Value/emergent results were called by telephone at the time  of interpretation on 04/11/2024 at 8:44 pm to provider [facility] Marysa Wessner , who verbally acknowledged these results. Electronically Signed   By: Rozell Cornet M.D.   On: 04/11/2024 20:48   DG Chest Port 1 View Result Date: 04/11/2024 CLINICAL DATA:  Intubated, seizures, intoxicated EXAM: PORTABLE CHEST 1 VIEW COMPARISON:  10/14/2021 FINDINGS: Single frontal view of the chest demonstrates endotracheal tube overlying tracheal air column, tip at level of thoracic inlet. Enteric catheter passes below diaphragm tip excluded by collimation. Cardiac silhouette is unremarkable. No acute airspace disease, effusion, or pneumothorax. No acute bony abnormalities. IMPRESSION: 1. No complication after intubation. No acute intrathoracic process. Electronically Signed   By: Bobbye Burrow M.D.   On: 04/11/2024 20:42    Procedures Procedure Name: Intubation Date/Time: 04/11/2024 9:37 PM  Performed by: Cheyenne Cotta, MDPre-anesthesia Checklist: Patient identified Oxygen Delivery Method: Non-rebreather mask Preoxygenation:  Pre-oxygenation with 100% oxygen Induction Type: IV induction Laryngoscope Size: Glidescope Grade View: Grade II Tube type: Subglottic suction tube Tube size: 7.0 mm Number of attempts: 2 Placement Confirmation: ETT inserted through vocal cords under direct vision, Positive ETCO2 and Breath sounds checked- equal and bilateral Difficulty Due To: Difficulty was anticipated Comments: Patient was intubated on the second try.  Suction was used to remove the blood.  Visualization of the cords was seen well.  Patient tolerated the procedure      {Document cardiac monitor, telemetry assessment procedure when appropriate:1}  Medications Ordered in ED Medications  propofol (DIPRIVAN) 1000 MG/100ML infusion (50 mcg/kg/min  72.6 kg Intravenous Rate/Dose Change 04/11/24 2016)  potassium chloride  10 mEq in 100 mL IVPB (10 mEq Intravenous New Bag/Given 04/11/24 2121)  levETIRAcetam (KEPPRA) undiluted injection 2,000 mg (2,000 mg Intravenous Given 04/11/24 1800)  succinylcholine (ANECTINE) syringe 120 mg (120 mg Intravenous Given 04/11/24 1833)  etomidate (AMIDATE) injection 20 mg (20 mg Intravenous Given 04/11/24 1832)  succinylcholine (ANECTINE) syringe 50 mg (50 mg Intravenous Given 04/11/24 1836)  levETIRAcetam (KEPPRA) undiluted injection 2,000 mg (2,000 mg Intravenous Given 04/11/24 1900)  sodium chloride  0.9 % bolus 2,000 mL (0 mLs Intravenous Stopped 04/11/24 2030)  LORazepam  (ATIVAN ) injection 2 mg (2 mg Intravenous Given 04/11/24 1815)  LORazepam  (ATIVAN ) 2 MG/ML injection (2 mg  Given 04/11/24 1935)    ED Course/ Medical Decision Making/ A&P   {  CRITICAL CARE Performed by: Cheyenne Cotta Total critical care time: 80 minutes Critical care time was exclusive of separately billable procedures and treating other patients. Critical care was necessary to treat or prevent imminent or life-threatening deterioration. Critical care was time spent personally by me on the following activities: development  of treatment plan with patient and/or surrogate as well as nursing, discussions with consultants, evaluation of patient's response to treatment, examination of patient, obtaining history from patient or surrogate, ordering and performing treatments and interventions, ordering and review of laboratory studies, ordering and review of radiographic studies, pulse oximetry and re-evaluation of patient's condition.    Patient was intubated because he was not protecting his airway.  Patient has a small subdural.  I spoke with neurosurgery and they recommended repeating the CT scan in 8 hours.  If it has not changed then neurosurgery has no other recommendations Click here for ABCD2, HEART and other calculatorsREFRESH Note before signing :1}                              Medical Decision Making Amount and/or Complexity of Data Reviewed Labs: ordered. Radiology:  ordered.  Risk Prescription drug management. Decision regarding hospitalization.   Patient with status epilepticus and subdural hematoma.  He will be admitted to critical care at [facility] with neurosurgery consulting  {Document critical care time when appropriate:1} {Document review of labs and clinical decision tools ie heart score, Chads2Vasc2 etc:1}  {Document your independent review of radiology images, and any outside records:1} {Document your discussion with family members, caretakers, and with consultants:1} {Document social determinants of health affecting pt's care:1} {Document your decision making why or why not admission, treatments were needed:1} Final Clinical Impression(s) / ED Diagnoses Final diagnoses:  None    Rx / DC Orders ED Discharge Orders     None

## 2024-04-12 ENCOUNTER — Encounter (HOSPITAL_COMMUNITY): Payer: Self-pay

## 2024-04-12 ENCOUNTER — Inpatient Hospital Stay (HOSPITAL_COMMUNITY)

## 2024-04-12 ENCOUNTER — Encounter (HOSPITAL_COMMUNITY)

## 2024-04-12 DIAGNOSIS — K7200 Acute and subacute hepatic failure without coma: Secondary | ICD-10-CM

## 2024-04-12 DIAGNOSIS — Y901 Blood alcohol level of 20-39 mg/100 ml: Secondary | ICD-10-CM | POA: Diagnosis not present

## 2024-04-12 DIAGNOSIS — Z4682 Encounter for fitting and adjustment of non-vascular catheter: Secondary | ICD-10-CM | POA: Diagnosis not present

## 2024-04-12 DIAGNOSIS — F1290 Cannabis use, unspecified, uncomplicated: Secondary | ICD-10-CM

## 2024-04-12 DIAGNOSIS — S065X0A Traumatic subdural hemorrhage without loss of consciousness, initial encounter: Secondary | ICD-10-CM

## 2024-04-12 DIAGNOSIS — J9601 Acute respiratory failure with hypoxia: Secondary | ICD-10-CM

## 2024-04-12 DIAGNOSIS — W19XXXA Unspecified fall, initial encounter: Secondary | ICD-10-CM | POA: Diagnosis not present

## 2024-04-12 DIAGNOSIS — F1090 Alcohol use, unspecified, uncomplicated: Secondary | ICD-10-CM

## 2024-04-12 DIAGNOSIS — E876 Hypokalemia: Secondary | ICD-10-CM

## 2024-04-12 DIAGNOSIS — S065XAA Traumatic subdural hemorrhage with loss of consciousness status unknown, initial encounter: Secondary | ICD-10-CM

## 2024-04-12 DIAGNOSIS — R569 Unspecified convulsions: Secondary | ICD-10-CM

## 2024-04-12 DIAGNOSIS — I6200 Nontraumatic subdural hemorrhage, unspecified: Secondary | ICD-10-CM | POA: Diagnosis not present

## 2024-04-12 DIAGNOSIS — F1010 Alcohol abuse, uncomplicated: Secondary | ICD-10-CM

## 2024-04-12 DIAGNOSIS — J9690 Respiratory failure, unspecified, unspecified whether with hypoxia or hypercapnia: Secondary | ICD-10-CM | POA: Diagnosis not present

## 2024-04-12 DIAGNOSIS — G40901 Epilepsy, unspecified, not intractable, with status epilepticus: Principal | ICD-10-CM | POA: Diagnosis present

## 2024-04-12 LAB — LIPASE, BLOOD: Lipase: 23 U/L (ref 11–51)

## 2024-04-12 LAB — URINALYSIS, ROUTINE W REFLEX MICROSCOPIC
Bilirubin Urine: NEGATIVE
Glucose, UA: NEGATIVE mg/dL
Ketones, ur: 20 mg/dL — AB
Leukocytes,Ua: NEGATIVE
Nitrite: NEGATIVE
Protein, ur: 100 mg/dL — AB
Specific Gravity, Urine: 1.016 (ref 1.005–1.030)
pH: 5 (ref 5.0–8.0)

## 2024-04-12 LAB — COMPREHENSIVE METABOLIC PANEL WITH GFR
ALT: 99 U/L — ABNORMAL HIGH (ref 0–44)
AST: 91 U/L — ABNORMAL HIGH (ref 15–41)
Albumin: 3.6 g/dL (ref 3.5–5.0)
Alkaline Phosphatase: 61 U/L (ref 38–126)
Anion gap: 9 (ref 5–15)
BUN: 6 mg/dL (ref 6–20)
CO2: 19 mmol/L — ABNORMAL LOW (ref 22–32)
Calcium: 8.5 mg/dL — ABNORMAL LOW (ref 8.9–10.3)
Chloride: 107 mmol/L (ref 98–111)
Creatinine, Ser: 0.84 mg/dL (ref 0.61–1.24)
GFR, Estimated: >60 mL/min (ref >60)
Glucose, Bld: 75 mg/dL (ref 70–99)
Potassium: 3.5 mmol/L (ref 3.5–5.1)
Sodium: 135 mmol/L (ref 135–145)
Total Bilirubin: 2 mg/dL — ABNORMAL HIGH (ref 0.0–1.2)
Total Protein: 6.3 g/dL — ABNORMAL LOW (ref 6.5–8.1)

## 2024-04-12 LAB — CBC
HCT: 39 % (ref 39.0–52.0)
Hemoglobin: 13 g/dL (ref 13.0–17.0)
MCH: 32.3 pg (ref 26.0–34.0)
MCHC: 33.3 g/dL (ref 30.0–36.0)
MCV: 97 fL (ref 80.0–100.0)
Platelets: 165 10*3/uL (ref 150–400)
RBC: 4.02 MIL/uL — ABNORMAL LOW (ref 4.22–5.81)
RDW: 13.9 % (ref 11.5–15.5)
WBC: 8.8 10*3/uL (ref 4.0–10.5)
nRBC: 0 % (ref 0.0–0.2)

## 2024-04-12 LAB — POCT I-STAT 7, (LYTES, BLD GAS, ICA,H+H)
Acid-base deficit: 5 mmol/L — ABNORMAL HIGH (ref 0.0–2.0)
Bicarbonate: 19 mmol/L — ABNORMAL LOW (ref 20.0–28.0)
Calcium, Ion: 1.23 mmol/L (ref 1.15–1.40)
HCT: 40 % (ref 39.0–52.0)
Hemoglobin: 13.6 g/dL (ref 13.0–17.0)
O2 Saturation: 99 %
Patient temperature: 99.4
Potassium: 3.7 mmol/L (ref 3.5–5.1)
Sodium: 137 mmol/L (ref 135–145)
TCO2: 20 mmol/L — ABNORMAL LOW (ref 22–32)
pCO2 arterial: 32 mmHg (ref 32–48)
pH, Arterial: 7.383 (ref 7.35–7.45)
pO2, Arterial: 121 mmHg — ABNORMAL HIGH (ref 83–108)

## 2024-04-12 LAB — GLUCOSE, CAPILLARY
Glucose-Capillary: 65 mg/dL — ABNORMAL LOW (ref 70–99)
Glucose-Capillary: 72 mg/dL (ref 70–99)
Glucose-Capillary: 72 mg/dL (ref 70–99)
Glucose-Capillary: 72 mg/dL (ref 70–99)
Glucose-Capillary: 74 mg/dL (ref 70–99)
Glucose-Capillary: 75 mg/dL (ref 70–99)
Glucose-Capillary: 80 mg/dL (ref 70–99)

## 2024-04-12 LAB — PHOSPHORUS: Phosphorus: 3.7 mg/dL (ref 2.5–4.6)

## 2024-04-12 LAB — HEMOGLOBIN A1C
Hgb A1c MFr Bld: 4.6 % — ABNORMAL LOW (ref 4.8–5.6)
Mean Plasma Glucose: 85.32 mg/dL

## 2024-04-12 LAB — MAGNESIUM: Magnesium: 2.3 mg/dL (ref 1.7–2.4)

## 2024-04-12 LAB — MRSA NEXT GEN BY PCR, NASAL: MRSA by PCR Next Gen: NOT DETECTED

## 2024-04-12 LAB — LACTIC ACID, PLASMA: Lactic Acid, Venous: 0.9 mmol/L (ref 0.5–1.9)

## 2024-04-12 LAB — HIV ANTIBODY (ROUTINE TESTING W REFLEX): HIV Screen 4th Generation wRfx: NONREACTIVE

## 2024-04-12 MED ORDER — PANTOPRAZOLE SODIUM 40 MG IV SOLR
40.0000 mg | Freq: Every day | INTRAVENOUS | Status: DC
Start: 2024-04-12 — End: 2024-04-12
  Administered 2024-04-12: 40 mg via INTRAVENOUS
  Filled 2024-04-12: qty 10

## 2024-04-12 MED ORDER — ORAL CARE MOUTH RINSE
15.0000 mL | OROMUCOSAL | Status: DC
  Administered 2024-04-12 – 2024-04-13 (×15): 15 mL via OROMUCOSAL

## 2024-04-12 MED ORDER — LEVETIRACETAM 500 MG PO TABS
500.0000 mg | ORAL_TABLET | Freq: Two times a day (BID) | ORAL | Status: DC
  Administered 2024-04-12 – 2024-04-14 (×4): 500 mg via ORAL
  Filled 2024-04-12 (×4): qty 1

## 2024-04-12 MED ORDER — FENTANYL CITRATE PF 50 MCG/ML IJ SOSY
50.0000 ug | PREFILLED_SYRINGE | INTRAMUSCULAR | Status: DC | PRN
  Administered 2024-04-12 (×2): 50 ug via INTRAVENOUS

## 2024-04-12 MED ORDER — LEVETIRACETAM (KEPPRA) 500 MG/5 ML ADULT IV PUSH
500.0000 mg | Freq: Two times a day (BID) | INTRAVENOUS | Status: DC
  Administered 2024-04-13: 500 mg via INTRAVENOUS
  Filled 2024-04-12 (×2): qty 5

## 2024-04-12 MED ORDER — GADOBUTROL 1 MMOL/ML IV SOLN
7.0000 mL | Freq: Once | INTRAVENOUS | Status: AC | PRN
  Administered 2024-04-12: 7 mL via INTRAVENOUS

## 2024-04-12 MED ORDER — PHENOBARBITAL 32.4 MG PO TABS
64.8000 mg | ORAL_TABLET | Freq: Every day | ORAL | Status: DC
  Administered 2024-04-12: 64.8 mg via ORAL
  Filled 2024-04-12: qty 2

## 2024-04-12 MED ORDER — PHENOBARBITAL 32.4 MG PO TABS: 64.8000 mg | ORAL_TABLET | Freq: Every day | ORAL | Status: DC

## 2024-04-12 MED ORDER — DOCUSATE SODIUM 50 MG/5ML PO LIQD: 100.0000 mg | Freq: Two times a day (BID) | ORAL | Status: DC | PRN

## 2024-04-12 MED ORDER — CHLORHEXIDINE GLUCONATE CLOTH 2 % EX PADS
6.0000 | MEDICATED_PAD | Freq: Every day | CUTANEOUS | Status: DC
  Administered 2024-04-12 – 2024-04-14 (×3): 6 via TOPICAL

## 2024-04-12 MED ORDER — POTASSIUM CHLORIDE 20 MEQ PO PACK
40.0000 meq | PACK | Freq: Once | ORAL | Status: AC
  Administered 2024-04-12: 40 meq
  Filled 2024-04-12: qty 2

## 2024-04-12 MED ORDER — INSULIN ASPART 100 UNIT/ML IJ SOLN
0.0000 [IU] | INTRAMUSCULAR | Status: DC
  Administered 2024-04-13: 3 [IU] via SUBCUTANEOUS
  Administered 2024-04-14: 2 [IU] via SUBCUTANEOUS

## 2024-04-12 MED ORDER — POLYETHYLENE GLYCOL 3350 17 G PO PACK: 17.0000 g | PACK | Freq: Every day | ORAL | Status: DC | PRN

## 2024-04-12 MED ORDER — FAMOTIDINE 20 MG PO TABS
20.0000 mg | ORAL_TABLET | Freq: Two times a day (BID) | ORAL | Status: DC
  Administered 2024-04-12 (×2): 20 mg
  Filled 2024-04-12 (×5): qty 1

## 2024-04-12 MED ORDER — DEXMEDETOMIDINE HCL IN NACL 400 MCG/100ML IV SOLN
0.0000 ug/kg/h | INTRAVENOUS | Status: DC
  Administered 2024-04-12: 0.7 ug/kg/h via INTRAVENOUS
  Administered 2024-04-12: 0.5 ug/kg/h via INTRAVENOUS
  Administered 2024-04-12: 0.4 ug/kg/h via INTRAVENOUS
  Administered 2024-04-13: 1 ug/kg/h via INTRAVENOUS
  Filled 2024-04-12 (×2): qty 100
  Filled 2024-04-12: qty 200
  Filled 2024-04-12: qty 100

## 2024-04-12 MED ORDER — ORAL CARE MOUTH RINSE: 15.0000 mL | OROMUCOSAL | Status: DC | PRN

## 2024-04-12 NOTE — H&P (Signed)
 NAME:  Timothy [name], MRN:  [id number], DOB:  1996/09/02, LOS: 1 ADMISSION DATE:  04/11/2024, CONSULTATION DATE:  04/11/24  REFERRING MD: Bryna Car CHIEF COMPLAINT:  Seizures   History of Present Illness:  Pt is a 27yr old male with a past medical significant of seizures, ETOH abuse, cannabis use disorder who presents to [facility] after hitting his head while having a seizure in a local store. EMS was called, and had to give versed due having another seizure. Upon arrival to [facility] ED, patient had 2 more seizures ultimately given IV ativan  and loaded with keppra. Unfortunately, patient unable to protect airway and was intubated. CT of head completed showing- Trace subdural hematoma along the anterior falx measuring 1-2 mm in thickness. No adjacent mass effect or  acute fracture cervical spine.  Tox screen positive for benzos and THC. Alcohol level 38. PCCM was consulted for admission and assist with ventilator management.   Pertinent  Medical History   Past Medical History:  Diagnosis Date   Cannabinoid hyperemesis syndrome    Febrile seizures (HCC)    last at 52 y of age     Significant Hospital Events: Including procedures, antibiotic start and stop dates in addition to other pertinent events   Admit with Seizures, intubated for airway protection   Interim History / Subjective:    Objective    Blood pressure 130/80, pulse 96, temperature 98.1 F (36.7 C), temperature source Axillary, resp. rate (!) 23, height 5\' 4"  (1.626 m), weight 72.6 kg, SpO2 99%.    Vent Mode: PRVC FiO2 (%):  [40 %-100 %] 40 % Set Rate:  [20 bmp] 20 bmp Vt Set:  [550 mL] 550 mL PEEP:  [5 cmH20] 5 cmH20 Plateau Pressure:  [19 cmH20] 19 cmH20   Intake/Output Summary (Last 24 hours) at 04/12/2024 0004 Last data filed at 04/11/2024 2216 Gross per 24 hour  Intake --  Output 750 ml  Net -750 ml   Filed Weights   04/11/24 1748  Weight: 72.6 kg    Examination: General: acutely ill young adult male,  lying in icu bed on vent- No distress  HEENT: Normocephalic, PERRLA intact, ETT, OG, Pink MM CV: s1,s2, RRR, no MRG, No JVD  pulm: clear, diminished, no distress on vent  Abs: bs active, soft  Extremities: no edema, no deformity  Skin: no rash  Neuro: Rass -2, responds to painful stimuli, cough gag reflex present, disconjugate gaze noted- Pupils 1+, sluggish GU: foley intact, yellow urine    Resolved problem list  N/a   Assessment and Plan  Status Epilepticus  Hx of Febrile Seizures as child  Suspecting lowering seizure threshold due to use of ETOH and cannabis  Tox screen + for THC and Benzos ETOH level 38  P: Prompt AEDs initiation and Seizure Precautions  Continuous monitoring of Airway and Vital signs via telemetry  Check CBG STAT and at least monitor CBG q4  Continuous EEG  Continue Keppra  Seizure precautions  Consult Neuro   Subdural Hematoma  CT of head completed showing- Trace subdural hematoma along the anterior falx measuring 1-2 mm in thickness. No adjacent mass effect or  acute fracture cervical spine.  On arrival to ICU, patient having disconjugate gaze   P: Neurosurgery consulted at [facility] Repeat CT to evaluate Subdural hematoma  Serial neurochecks   Acute Hypoxic Respiratory Failure in setting of above  P:  Continue ventilator support and lung protective strategies  Continue LTVV  Wean PEEP and Fio2  requirements to sat goal of >92%  HOB > 30 degrees Plat < 30  Aim for Driving pressures < 15  Intermittent Chest X-ray and ABGS VAP and PAD protocols in place  Wean sedation as tolerated, SBT and WUA daily  If WBC increases or patient develops a fever, low threshold to start empiric antibiotics   ETOH abuse  Cannibis Use Disorder  Hx of Cannabinoid hyperemesis  Etoh level 38 + for THC  P: Substance abuse counseling once off vent- will eventually need TOC consult   Hypokalemia  K 2.9 P:  Replace with potassium 10 meq x 5   Elevate Liver  Enzymes Suspect secondary to ETOH disorder  P: RUQ ultrasound of liver in AM  Trend LFTs    Best Practice (right click and "Reselect all SmartList Selections" daily)   Diet/type: NPO DVT prophylaxis SCD Pressure ulcer(s): N/A GI prophylaxis: PPI Lines: N/A Foley:  N/A Code Status:  full code Last date of multidisciplinary goals of care discussion [ pending ]   Labs   CBC: Recent Labs  Lab 04/11/24 1810  WBC 12.6*  NEUTROABS 4.8  HGB 15.9  HCT 52.3*  MCV 109.4*  PLT 249    Basic Metabolic Panel: Recent Labs  Lab 04/11/24 1810  NA 137  K 2.9*  CL 106  CO2 <7*  GLUCOSE 124*  BUN 6  CREATININE 1.07  CALCIUM 10.4*   GFR: Estimated Creatinine Clearance: 94.8 mL/min (by C-G formula based on SCr of 1.07 mg/dL). Recent Labs  Lab 04/11/24 1810  WBC 12.6*    Liver Function Tests: Recent Labs  Lab 04/11/24 1810  AST 115*  ALT 126*  ALKPHOS 96  BILITOT 1.5*  PROT 8.7*  ALBUMIN 5.1*   No results for input(s): "LIPASE", "AMYLASE" in the last 168 hours. No results for input(s): "AMMONIA" in the last 168 hours.  ABG    Component Value Date/Time   PHART 7.36 04/11/2024 1927   PCO2ART 37 04/11/2024 1927   PO2ART 130 (H) 04/11/2024 1927   HCO3 20.9 04/11/2024 1927   TCO2 24 05/28/2018 2356   ACIDBASEDEF 4.0 (H) 04/11/2024 1927   O2SAT 99.7 04/11/2024 1927     Coagulation Profile: No results for input(s): "INR", "PROTIME" in the last 168 hours.  Cardiac Enzymes: No results for input(s): "CKTOTAL", "CKMB", "CKMBINDEX", "TROPONINI" in the last 168 hours.  HbA1C: No results found for: "HGBA1C"  CBG: No results for input(s): "GLUCAP" in the last 168 hours.  Review of Systems:   See HPI   Past Medical History:  He,  has a past medical history of Cannabinoid hyperemesis syndrome and Febrile seizures (HCC).   Surgical History:  History reviewed. No pertinent surgical history.   Social History:   reports that he has been smoking cigarettes. He  has never used smokeless tobacco. He reports current alcohol use of about 84.0 standard drinks of alcohol per week. He reports current drug use. Drug: Marijuana.   Family History:  His family history includes Diabetes in his maternal grandmother.   Allergies Allergies  Allergen Reactions   Amoxil [Amoxicillin] Rash     Home Medications  Prior to Admission medications   Medication Sig Start Date End Date Taking? Authorizing Provider  chlordiazePOXIDE  (LIBRIUM ) 25 MG capsule 50mg  PO TID x 1D, then 25-50mg  PO BID X 1D, then 25-50mg  PO QD X 1D Patient not taking: Reported on 04/11/2024 06/16/23   Baxter Limber A, PA-C  metoCLOPramide  (REGLAN ) 10 MG tablet Take 1 tablet (10 mg total)  by mouth every 6 (six) hours. Patient not taking: Reported on 04/11/2024 08/14/23   Ballard Bongo, MD  omeprazole  (PRILOSEC ) 20 MG capsule Take 1 capsule (20 mg total) by mouth daily. Patient not taking: Reported on 04/11/2024 08/01/22   Triplett, Tammy, PA-C  ondansetron  (ZOFRAN ) 4 MG tablet Take 1 tablet (4 mg total) by mouth every 6 (six) hours as needed for nausea or vomiting. As needed for nausea vomiting Patient not taking: Reported on 04/11/2024 03/12/24   Baxter Limber A, PA-C  ondansetron  (ZOFRAN -ODT) 4 MG disintegrating tablet Take 1 tablet (4 mg total) by mouth every 8 (eight) hours as needed for nausea or vomiting. Patient not taking: Reported on 04/11/2024 02/14/24   Mandy Second, PA-C  pantoprazole  (PROTONIX ) 40 MG tablet Take 1 tablet (40 mg total) by mouth daily. Patient not taking: Reported on 04/11/2024 06/16/23   Baxter Limber A, PA-C  potassium chloride  SA (KLOR-CON  M) 20 MEQ tablet Take 1 tablet (20 mEq total) by mouth 2 (two) times daily. Patient not taking: Reported on 04/11/2024 02/14/24   Mandy Second, PA-C  prochlorperazine  (COMPAZINE ) 10 MG tablet Take 1 tablet (10 mg total) by mouth 2 (two) times daily as needed for nausea or vomiting. Patient not taking: Reported on 04/11/2024  02/14/24   Mandy Second, PA-C  promethazine  (PHENERGAN ) 25 MG suppository Place 1 suppository (25 mg total) rectally every 6 (six) hours as needed for nausea or vomiting. Patient not taking: Reported on 04/11/2024 06/17/23   Smoot, Sarah A, PA-C     Critical care time: 50 mins     Rey Catholic AGACNP-BC   [facility] 04/12/2024, 1:11 AM  Please see [email or web address] for pager details.  From 7A-7P if no response, please call [phone]. After hours, please call ELink [phone].

## 2024-04-12 NOTE — Progress Notes (Signed)
 S:   Improving mentation as sedation is reduced, on 35 mcg propofol   O:   Able to open eyes, shows thumb and two fingers, slow to follow commands. Maintains LLE antigravity but not RLE (though does have some movement in the RLE). Poor attention/concentration. Orients to examiner's voice bilaterally. Corneals symmetric to light eyelash brush. Cough intact.   A/P:  Try to transition from propofol to precedex  MRI brain w/ and w/o for seizure workup, would try to obtain prior to extubation given mom reports significant claustrophobia Discontinue EEG if read negative as his mental status is improving (prelim read negative, will discontinue) Continue Keppra Discussed with mom at bedside and CCM via secure chat  Baldwin Levee MD-PhD [facility] [phone] Available 7 AM to 7 PM, outside these hours please contact Neurologist on call listed on AMION   15 min of additional care

## 2024-04-12 NOTE — Progress Notes (Signed)
LTM EEG disconnected - no skin breakdown at unhook. [facility] notified.  

## 2024-04-12 NOTE — Progress Notes (Signed)
 NAME:  Timothy [name], MRN:  [id number], DOB:  01-Aug-1996, LOS: 1 ADMISSION DATE:  04/11/2024, CONSULTATION DATE:  04/11/24  REFERRING MD: Bryna Car CHIEF COMPLAINT:  Seizures   History of Present Illness:  Pt is a 28yr old male with a past medical significant of seizures, ETOH abuse, cannabis use disorder who presents to [facility] after hitting his head while having a seizure in a local store. EMS was called, and had to give versed due having another seizure. Upon arrival to [facility] ED, patient had 2 more seizures ultimately given IV ativan  and loaded with keppra. Unfortunately, patient unable to protect airway and was intubated. CT of head completed showing- Trace subdural hematoma along the anterior falx measuring 1-2 mm in thickness. No adjacent mass effect or  acute fracture cervical spine.  Tox screen positive for benzos and THC. Alcohol level 38. PCCM was consulted for admission and assist with ventilator management.   Pertinent  Medical History   Past Medical History:  Diagnosis Date   Cannabinoid hyperemesis syndrome    Febrile seizures (HCC)    last at 83 y of age     Significant Hospital Events: Including procedures, antibiotic start and stop dates in addition to other pertinent events   5/23 admit with Seizures, intubated for airway protection, started on propofol and placed on continuous EEG.  Interim History / Subjective:  No clinical seizures overnight.  Objective    Blood pressure 118/82, pulse 69, temperature 98.1 F (36.7 C), temperature source Axillary, resp. rate 15, height 5\' 4"  (1.626 m), weight 65.4 kg, SpO2 99%.    Vent Mode: PRVC FiO2 (%):  [30 %-100 %] 30 % Set Rate:  [15 bmp-20 bmp] 15 bmp Vt Set:  [470 mL-550 mL] 470 mL PEEP:  [5 cmH20] 5 cmH20 Plateau Pressure:  [15 cmH20-19 cmH20] 16 cmH20   Intake/Output Summary (Last 24 hours) at 04/12/2024 1219 Last data filed at 04/12/2024 1200 Gross per 24 hour  Intake 774.18 ml  Output 1825 ml  Net -1050.82  ml   Filed Weights   04/11/24 1748 04/12/24 0101  Weight: 72.6 kg 65.4 kg    Examination: General: acutely ill young adult male, lying in icu bed on vent- No distress  HEENT: Normocephalic, PERRLA intact, ETT, OG, Pink MM CV: s1,s2, RRR, no MRG, No JVD  pulm: clear, diminished, no distress on vent  Abs: bs active, soft  Extremities: no edema, no deformity  Skin: no rash  Neuro: Rass -5, no response to stimulation.  GU: foley intact, yellow urine   Continuous EEG shows generalized slowing.   Ancillary tests personally reviewed:  Normal electrolytes. Mildly elevated ALT AST.  Mild hyperbilirubinemia.  Negative lactic acid.  Normal CBC.  Assessment and Plan  Status Epilepticus  Hx of Febrile Seizures as child  Subdural Hematoma  Acute Hypoxic Respiratory Failure in setting of above  ETOH abuse  Cannibis Use Disorder  Hx of Cannabinoid hyperemesis  Elevate Liver Enzymes  Plan:  - Start to wean propofol and continue fentanyl for now. - monitor for seizure recurrence.  - monitor signs of alcohol withdrawal.  - SBT and extubate once mental status allows.  - Continue home Keppra.   Best Practice (right click and "Reselect all SmartList Selections" daily)   Diet/type: NPO DVT prophylaxis SCD Pressure ulcer(s): N/A GI prophylaxis: PPI Lines: N/A Foley:  N/A Code Status:  full code Last date of multidisciplinary goals of care discussion [ family updated at the bedside. ]  CRITICAL CARE Performed by: Arlina Lair   Total critical care time: 45 minutes  Critical care time was exclusive of separately billable procedures and treating other patients.  Critical care was necessary to treat or prevent imminent or life-threatening deterioration.  Critical care was time spent personally by me on the following activities: development of treatment plan with patient and/or surrogate as well as nursing, discussions with consultants, evaluation of patient's response to  treatment, examination of patient, obtaining history from patient or surrogate, ordering and performing treatments and interventions, ordering and review of laboratory studies, ordering and review of radiographic studies, pulse oximetry, re-evaluation of patient's condition and participation in multidisciplinary rounds.  Arlina Lair, MD [facility] ICU Physician [facility] [place] [facility]  Pager: [phone] Mobile: [phone] After hours: [phone].

## 2024-04-12 NOTE — Progress Notes (Signed)
 eLink Physician-Brief Progress Note Patient Name: Timothy [name] DOB: 10/26/1996 MRN: [id number]   Date of Service  04/12/2024  HPI/Events of Note   28yr old male with a past medical of seizures, ETOH abuse, cannabis use disorder who presented to [facility] after hitting his head while having a seizure. EMS was called and more seizures witnessed requiring versed.  Upon arrival to [facility] ED, patient had 2 more seizures ultimately given IV ativan  and loaded with keppra. Patient unable to protect airway and was intubated. CT of head completed showing- Trace subdural hematoma along the anterior falx measuring 1-2 mm in thickness. No adjacent mass effect or acute fracture cervical spine.  Tox screen positive for benzos and THC. Alcohol level 38. PCCM was consulted for admission.   eICU Interventions  Chart reviewed     Intervention Category Evaluation Type: New Patient Evaluation  Linda Repress, P 04/12/2024, 1:48 AM

## 2024-04-12 NOTE — Plan of Care (Signed)
  Problem: Safety: Goal: Non-violent Restraint(s) Outcome: Progressing   Problem: Clinical Measurements: Goal: Ability to maintain clinical measurements within normal limits will improve Outcome: Progressing Goal: Will remain free from infection Outcome: Progressing Goal: Cardiovascular complication will be avoided Outcome: Progressing   Problem: Nutrition: Goal: Adequate nutrition will be maintained Outcome: Progressing   Problem: Pain Managment: Goal: General experience of comfort will improve and/or be controlled Outcome: Progressing   Problem: Safety: Goal: Ability to remain free from injury will improve Outcome: Progressing

## 2024-04-12 NOTE — Plan of Care (Signed)
  Problem: Clinical Measurements: Goal: Ability to maintain clinical measurements within normal limits will improve Outcome: Progressing Goal: Will remain free from infection Outcome: Progressing Goal: Diagnostic test results will improve Outcome: Progressing Goal: Respiratory complications will improve Outcome: Progressing Goal: Cardiovascular complication will be avoided Outcome: Progressing   Problem: Safety: Goal: Ability to remain free from injury will improve Outcome: Progressing   Problem: Skin Integrity: Goal: Risk for impaired skin integrity will decrease Outcome: Progressing   Problem: Education: Goal: Knowledge of General Education information will improve Description: Including pain rating scale, medication(s)/side effects and non-pharmacologic comfort measures Outcome: Not Progressing (secondary to sedation/intubation and pt is non-interactive at present time.)

## 2024-04-12 NOTE — Progress Notes (Signed)
 LTM EEG hooked up and running - no initial skin breakdown - push button tested - [facility] monitoring.

## 2024-04-12 NOTE — Progress Notes (Signed)
 Transported pt from 2M12 to MRI and back with bedside RN. Vitals are stable.

## 2024-04-12 NOTE — Consult Note (Signed)
 NEUROLOGY CONSULT NOTE   Date of service: Apr 12, 2024 Patient Name: Timothy [name] MRN:  [id number] DOB:  [birth year] Chief Complaint: "Seizures, debated" Requesting Provider: Claven Cumming, MD  History of Present Illness  Timothy [name] is a 28 y.o. male with hx of alcohol use, marijuana use and complicated by hyperemesis, seizures as a kid who presents with seizure while shopping at [facility] with resultant fall and trace traumatic subdural hematoma.  Spoke with patient's mother who reports that patient drinks about 10 beers a day, each about 24 ounces.  Patient also uses marijuana which causes him to have significant vomiting.  Patient had a seizure when he was 67 months old and then another 1 when he was 28 years old.  He had been seizure-free for over 20 years and then over the last 5 years, he has had about 3 seizures.  Each of his seizures had occurred in the setting of not drinking alcohol due to recurrent vomiting.  Patient was initially brought in to [facility].  He had a seizure when EMS got there and he was given Versed.  He was given Ativan  and loaded with Keppra and was intubated for failure to protect his airway.  He had a CT head without contrast which demonstrated trace subdural hematoma along the anterior falx which was noted to be stable on repeat imaging.  Patient was started on propofol after intubation and transferred to [facility], ICU for further evaluation and workup.  No clinical seizure activity has been witnessed since he was loaded with Keppra and started on propofol.  Mother reports that patient was fine all day and able went to work and was at his usual baseline before this happened.  Patient does get sweaty and tremory if he does not drink alcohol.    ROS  Unable to ascertain due to intubation and sedation.  Past History   Past Medical History:  Diagnosis Date   Cannabinoid hyperemesis syndrome    Febrile seizures (HCC)    last at 43 y of age     History reviewed. No pertinent surgical history.  Family History: Family History  Problem Relation Age of Onset   Diabetes Maternal Grandmother     Social History  reports that he has been smoking cigarettes. He has never used smokeless tobacco. He reports current alcohol use of about 84.0 standard drinks of alcohol per week. He reports current drug use. Drug: Marijuana.  Allergies  Allergen Reactions   Amoxil [Amoxicillin] Rash    Medications   Current Facility-Administered Medications:    Chlorhexidine Gluconate Cloth 2 % PADS 6 each, 6 each, Topical, Daily, Claven Cumming, MD, 6 each at 04/12/24 0119   dexmedetomidine (PRECEDEX) 400 MCG/100ML (4 mcg/mL) infusion, 0-1.2 mcg/kg/hr, Intravenous, Continuous, Orelia Binet C, NP, Last Rate: 7.26 mL/hr at 04/12/24 0117, 0.4 mcg/kg/hr at 04/12/24 0117   docusate (COLACE) 50 MG/5ML liquid 100 mg, 100 mg, Per Tube, BID PRN, Smith, Joshua C, NP   famotidine  (PEPCID ) tablet 20 mg, 20 mg, Per Tube, BID, Smith, Joshua C, NP   fentaNYL in NS (51mcg/ml) infusion-PREMIX, 0-400 mcg/hr, Intravenous, Continuous, Zammit, Joseph, MD, Last Rate: 15 mL/hr at 04/12/24 0108, 150 mcg/hr at 04/12/24 0108   insulin aspart (novoLOG) injection 0-15 Units, 0-15 Units, Subcutaneous, Q4H, Smith, Joshua C, NP   Oral care mouth rinse, 15 mL, Mouth Rinse, Q2H, Claven Cumming, MD, 15 mL at 04/12/24 0440   Oral care mouth rinse, 15 mL, Mouth Rinse, PRN,  Claven Cumming, MD   pantoprazole  (PROTONIX ) injection 40 mg, 40 mg, Intravenous, QHS, Smith, Joshua C, NP, 40 mg at 04/12/24 0055   polyethylene glycol (MIRALAX / GLYCOLAX) packet 17 g, 17 g, Per Tube, Daily PRN, Smith, Joshua C, NP   potassium chloride  (KLOR-CON ) packet 40 mEq, 40 mEq, Per Tube, Once, Linda Repress, MD   propofol (DIPRIVAN) 1000 MG/100ML infusion, 0-80 mcg/kg/min, Intravenous, Continuous, Cheyenne Cotta, MD, Last Rate: 28.3 mL/hr at 04/12/24 0335, 65 mcg/kg/min at 04/12/24 0335  Vitals    Vitals:   04/12/24 0422 04/12/24 0430 04/12/24 0445 04/12/24 0500  BP:  116/76 114/72 111/71  Pulse: 67 69 68 67  Resp: 15 15 15 15   Temp:      TempSrc:      SpO2: 99% 99% 99% 99%  Weight:      Height:        Body mass index is 24.75 kg/m.  Physical Exam   General: Laying comfortably in bed; in no acute distress.  HENT: Normal oropharynx and mucosa. Normal external appearance of ears and nose.  Neck: Supple, no pain or tenderness  CV: No JVD. No peripheral edema.  Pulmonary: Symmetric Chest rise.  Appears to not be breathing over vent. Abdomen: Soft to touch, non-tender.  Ext: No cyanosis, edema, or deformity  Skin: No rash. Normal palpation of skin.   Musculoskeletal: Normal digits and nails by inspection. No clubbing.   Neurologic Examination performed while on propofol at 65.  Mental status/Cognition: Eyes closed, no response to loud voice or clap.  Slight facial grimace noted to vigorous tactile stimulation.  Speech/language: Mute, no speech throughout the encounter, likely secondary to intubation and sedation.   Cranial nerves:   CN II Pupils equal and sluggishly reactive to light   CN III,IV,VI EOMI intact, no nystagmus.   CN V Corneals absent bilaterally   CN VII Unable to assess.   CN VIII Does not turn head towards speech.   CN IX & X Cough is intact, unable to elicit a gag.   CN XI Head is midline.   CN XII Does not protrude tongue on command.   Sensory/motor:  Muscle bulk: Normal, tone flaccid in all extremities. No response noted to proximal pinch in any of the extremities.  Coordination/Complex Motor:  Unable to assess coordination. Labs/Imaging/Neurodiagnostic studies   CBC:  Recent Labs  Lab 16-Apr-2024 1810 04/12/24 0112 04/12/24 0259  WBC 12.6*  --  8.8  NEUTROABS 4.8  --   --   HGB 15.9 13.6 13.0  HCT 52.3* 40.0 39.0  MCV 109.4*  --  97.0  PLT 249  --  165   Basic Metabolic Panel:  Lab Results  Component Value Date   NA 135 04/12/2024    K 3.5 04/12/2024   CO2 19 (L) 04/12/2024   GLUCOSE 75 04/12/2024   BUN 6 04/12/2024   CREATININE 0.84 04/12/2024   CALCIUM 8.5 (L) 04/12/2024   GFRNONAA >60 04/12/2024   GFRAA >60 07/29/2019   Lipid Panel: No results found for: "LDLCALC" HgbA1c:  Lab Results  Component Value Date   HGBA1C 4.6 (L) 04/12/2024   Urine Drug Screen:     Component Value Date/Time   LABOPIA NONE DETECTED 04-16-24 1802   COCAINSCRNUR NONE DETECTED Apr 16, 2024 1802   LABBENZ POSITIVE (A) Apr 16, 2024 1802   AMPHETMU NONE DETECTED 16-Apr-2024 1802   THCU POSITIVE (A) 04/16/2024 1802   LABBARB NONE DETECTED 04-16-24 1802    Alcohol Level     Component  Value Date/Time   ETH 38 (H) 04/11/2024 1810   INR No results found for: "INR" APTT No results found for: "APTT" AED levels: No results found for: "PHENYTOIN", "ZONISAMIDE", "LAMOTRIGINE", "LEVETIRACETA"  CT Head without contrast(Personally reviewed): 1. Trace subdural hematoma along the anterior falx measuring 1-2 mm in thickness. No adjacent mass effect. (See above for traumatic brain injury risk stratification) 2. No acute fracture or traumatic listhesis in the cervical spine.  Repeat CT head without contrast(Personally reviewed): Decreased size/conspicuity of trace subdural hemorrhage along the falx.  MRI Brain(Personally reviewed): Pending  Neurodiagnostics cEEG:  Pending  ASSESSMENT   Tadao A Lalla is a 28 y.o. male with hx of alcohol use, marijuana use and complicated by hyperemesis, seizures as a kid who presents with seizure while shopping at [facility] with resultant fall and trace traumatic subdural hematoma.  He was at his baseline per family prior to this.  His alcohol levels were mildly elevated to 38.  Patient does have a prior history of childhood seizures and had a seizure when he was 49 months old in the setting of fever and then another seizure when he was 28 years old.  He did not have any seizures all the way to the age 28,  then had 3 seizures in the last 5 years.  The fact that he had a seizure today and his alcohol levels were still elevated to 38, I am more inclined to start him on a antiepileptic.  Though it may very well be that given his significant alcohol intake every day(240oz of beer), an alcohol level of 38 might be enough to trigger withdrawal seizure.  He is not having any clinical signs of seizures at this time.  RECOMMENDATIONS  -Continuous EEG in AM. -MRI brain without contrast. -Keppra 500 mg twice daily. -Continue propofol at 65 overnight.  Once patient is on continuous EEG, will wean propofol gradually while patient is on continuous EEG. -No driving for 6 months.  This was discussed with family in detail at the bedside.  However, this will need to be discussed again with patient when he is awake and extubated. ______________________________________________________________________  This patient is critically ill and at significant risk of neurological worsening, death and care requires constant monitoring of vital signs, hemodynamics,respiratory and cardiac monitoring, neurological assessment, discussion with family, other specialists and medical decision making of high complexity. I spent 40 minutes of neurocritical care time  in the care of  this patient. This was time spent independent of any time provided by nurse practitioner or PA.  Kathleena Freeman [facility] 04/12/2024  5:51 AM    Signed, Michala Deblanc, MD [facility]

## 2024-04-12 NOTE — Progress Notes (Signed)
 Pt has been evaluated by neuro physician.  States will go forward with cEEG in morning, to be placed by tech.  Also stated will be ordering MRI and to not titrate propofol down (gtt infusing at 65 mcg/kg/min when evaluated). X-ray showed OGT end port in distal esophagus and recommended advancing.  Air bolus auscultated but was delayed and unable to aspirate any gastric contents.  OGT advanced to 60 cm at lip line.  Air bolus auscultated immediately when pushed a second time.  Attached to continuous LWS for approximately 5 minutes with return of brown, mucous-like liquid, 300 mLs..  OGT re-secured with tape.

## 2024-04-12 NOTE — Progress Notes (Signed)
 [facility] ADULT ICU REPLACEMENT PROTOCOL   The patient does apply for the [facility] Adult ICU Electrolyte Replacment Protocol based on the criteria listed below:   1.Exclusion criteria: TCTS, ECMO, Dialysis, and Myasthenia Gravis patients 2. Is GFR >/= 30 ml/min? Yes.    Patient's GFR today is >60 3. Is SCr </= 2? Yes.   Patient's SCr is 0.84 mg/dL 4. Did SCr increase >/= 0.5 in 24 hours? No. 5.Pt's weight >40kg  Yes.   6. Abnormal electrolyte(s):   K 3.5  7. Electrolytes replaced per protocol 8.  Call MD STAT for K+ </= 2.5, Phos </= 1, or Mag </= 1 Physician:  Tiajuana Fluke R Genasis Zingale 04/12/2024 5:16 AM

## 2024-04-12 NOTE — Progress Notes (Addendum)
 Pt transported from 2M12 to CT and back on the ventilator without any complications.

## 2024-04-13 DIAGNOSIS — S065XAA Traumatic subdural hemorrhage with loss of consciousness status unknown, initial encounter: Secondary | ICD-10-CM | POA: Diagnosis not present

## 2024-04-13 DIAGNOSIS — Z79899 Other long term (current) drug therapy: Secondary | ICD-10-CM | POA: Diagnosis not present

## 2024-04-13 DIAGNOSIS — F17210 Nicotine dependence, cigarettes, uncomplicated: Secondary | ICD-10-CM | POA: Diagnosis not present

## 2024-04-13 DIAGNOSIS — K7200 Acute and subacute hepatic failure without coma: Secondary | ICD-10-CM | POA: Diagnosis not present

## 2024-04-13 DIAGNOSIS — J9601 Acute respiratory failure with hypoxia: Secondary | ICD-10-CM | POA: Diagnosis not present

## 2024-04-13 DIAGNOSIS — F1290 Cannabis use, unspecified, uncomplicated: Secondary | ICD-10-CM | POA: Diagnosis not present

## 2024-04-13 DIAGNOSIS — R569 Unspecified convulsions: Secondary | ICD-10-CM

## 2024-04-13 DIAGNOSIS — Z880 Allergy status to penicillin: Secondary | ICD-10-CM | POA: Diagnosis not present

## 2024-04-13 DIAGNOSIS — Z833 Family history of diabetes mellitus: Secondary | ICD-10-CM | POA: Diagnosis not present

## 2024-04-13 DIAGNOSIS — F10139 Alcohol abuse with withdrawal, unspecified: Secondary | ICD-10-CM | POA: Diagnosis not present

## 2024-04-13 DIAGNOSIS — F1210 Cannabis abuse, uncomplicated: Secondary | ICD-10-CM | POA: Diagnosis not present

## 2024-04-13 DIAGNOSIS — S065X0A Traumatic subdural hemorrhage without loss of consciousness, initial encounter: Secondary | ICD-10-CM | POA: Diagnosis not present

## 2024-04-13 DIAGNOSIS — G40901 Epilepsy, unspecified, not intractable, with status epilepticus: Secondary | ICD-10-CM | POA: Diagnosis not present

## 2024-04-13 DIAGNOSIS — E876 Hypokalemia: Secondary | ICD-10-CM | POA: Diagnosis not present

## 2024-04-13 DIAGNOSIS — F1010 Alcohol abuse, uncomplicated: Secondary | ICD-10-CM | POA: Diagnosis not present

## 2024-04-13 LAB — PHOSPHORUS: Phosphorus: 3.8 mg/dL (ref 2.5–4.6)

## 2024-04-13 LAB — BASIC METABOLIC PANEL WITH GFR
Anion gap: 13 (ref 5–15)
BUN: 7 mg/dL (ref 6–20)
CO2: 19 mmol/L — ABNORMAL LOW (ref 22–32)
Calcium: 9.3 mg/dL (ref 8.9–10.3)
Chloride: 105 mmol/L (ref 98–111)
Creatinine, Ser: 0.86 mg/dL (ref 0.61–1.24)
GFR, Estimated: >60 mL/min (ref >60)
Glucose, Bld: 62 mg/dL — ABNORMAL LOW (ref 70–99)
Potassium: 4.2 mmol/L (ref 3.5–5.1)
Sodium: 137 mmol/L (ref 135–145)

## 2024-04-13 LAB — GLUCOSE, CAPILLARY
Glucose-Capillary: 77 mg/dL (ref 70–99)
Glucose-Capillary: 158 mg/dL — ABNORMAL HIGH (ref 70–99)
Glucose-Capillary: 50 mg/dL — ABNORMAL LOW (ref 70–99)
Glucose-Capillary: 71 mg/dL (ref 70–99)
Glucose-Capillary: 117 mg/dL — ABNORMAL HIGH (ref 70–99)
Glucose-Capillary: 58 mg/dL — ABNORMAL LOW (ref 70–99)
Glucose-Capillary: 70 mg/dL (ref 70–99)
Glucose-Capillary: 201 mg/dL — ABNORMAL HIGH (ref 70–99)
Glucose-Capillary: 193 mg/dL — ABNORMAL HIGH (ref 70–99)
Glucose-Capillary: 96 mg/dL (ref 70–99)

## 2024-04-13 LAB — MAGNESIUM: Magnesium: 1.8 mg/dL (ref 1.7–2.4)

## 2024-04-13 MED ORDER — ACETAMINOPHEN 325 MG PO TABS
650.0000 mg | ORAL_TABLET | Freq: Four times a day (QID) | ORAL | Status: DC | PRN
Start: 2024-04-13 — End: 2024-04-14
  Filled 2024-04-13 (×2): qty 2

## 2024-04-13 MED ORDER — PHENOBARBITAL SODIUM 130 MG/ML IJ SOLN
130.0000 mg | Freq: Once | INTRAMUSCULAR | Status: AC
  Administered 2024-04-13: 130 mg via INTRAVENOUS
  Filled 2024-04-13: qty 1

## 2024-04-13 MED ORDER — PHENOBARBITAL 32.4 MG PO TABS
32.4000 mg | ORAL_TABLET | Freq: Every day | ORAL | Status: DC
  Administered 2024-04-13: 32.4 mg via ORAL
  Filled 2024-04-13: qty 1

## 2024-04-13 MED ORDER — POLYETHYLENE GLYCOL 3350 17 G PO PACK: 17.0000 g | PACK | Freq: Every day | ORAL | Status: DC | PRN

## 2024-04-13 MED ORDER — DEXTROSE 50 % IV SOLN
12.5000 g | Freq: Once | INTRAVENOUS | Status: AC
  Administered 2024-04-13: 12.5 g via INTRAVENOUS

## 2024-04-13 MED ORDER — DEXTROSE 50 % IV SOLN
INTRAVENOUS | Status: AC
  Filled 2024-04-13: qty 50

## 2024-04-13 MED ORDER — KETOROLAC TROMETHAMINE 15 MG/ML IJ SOLN
15.0000 mg | Freq: Three times a day (TID) | INTRAMUSCULAR | Status: DC | PRN
  Administered 2024-04-13: 15 mg via INTRAVENOUS
  Filled 2024-04-13: qty 1

## 2024-04-13 MED ORDER — DOCUSATE SODIUM 50 MG/5ML PO LIQD: 100.0000 mg | Freq: Two times a day (BID) | ORAL | Status: DC | PRN

## 2024-04-13 MED ORDER — OXYCODONE-ACETAMINOPHEN 5-325 MG PO TABS: 1.0000 | ORAL_TABLET | ORAL | Status: DC | PRN

## 2024-04-13 MED ORDER — MIDAZOLAM HCL 2 MG/2ML IJ SOLN
1.0000 mg | INTRAMUSCULAR | Status: DC | PRN
  Administered 2024-04-13: 1 mg via INTRAVENOUS
  Administered 2024-04-13: 2 mg via INTRAVENOUS
  Filled 2024-04-13 (×3): qty 2

## 2024-04-13 MED ORDER — MAGNESIUM SULFATE 2 GM/50ML IV SOLN
2.0000 g | Freq: Once | INTRAVENOUS | Status: AC
  Administered 2024-04-13: 2 g via INTRAVENOUS
  Filled 2024-04-13: qty 50

## 2024-04-13 MED ORDER — ORAL CARE MOUTH RINSE
15.0000 mL | OROMUCOSAL | Status: DC
  Administered 2024-04-13 (×2): 15 mL via OROMUCOSAL

## 2024-04-13 MED ORDER — NAPROXEN 375 MG PO TABS
375.0000 mg | ORAL_TABLET | Freq: Two times a day (BID) | ORAL | Status: DC | PRN
Start: 2024-04-13 — End: 2024-04-13

## 2024-04-13 MED ORDER — DEXMEDETOMIDINE HCL IN NACL 400 MCG/100ML IV SOLN
0.0000 ug/kg/h | INTRAVENOUS | Status: DC
  Administered 2024-04-13 (×2): 1.2 ug/kg/h via INTRAVENOUS
  Administered 2024-04-13: 0.4 ug/kg/h via INTRAVENOUS
  Administered 2024-04-14: 0.6 ug/kg/h via INTRAVENOUS
  Filled 2024-04-13 (×4): qty 100

## 2024-04-13 NOTE — Procedures (Signed)
 Extubation Procedure Note  Patient Details:   Name: Timothy [name] DOB: [birth year] MRN: [id number]   Airway Documentation:    Vent end date: 04/13/24 Vent end time: 0750   Evaluation  O2 sats: stable throughout Complications: No apparent complications Patient did tolerate procedure well. Bilateral Breath Sounds: Clear, Diminished   Yes  Patient extubated per order to [facility] with no apparent complications. Positive cuff leak was noted prior to extubation. Patient is alert, has strong cough, and is able to speak. Vitals are stable.   [facility] 04/13/2024, 7:55 AM

## 2024-04-13 NOTE — Plan of Care (Signed)
 MRI brain personally reviewed, expected posttraumatic findings.  Due to his seizure risk factors and these posttraumatic findings, I agree with prior plan to continue antiseizure medication at this time  No further inpatient neurological workup would be indicated at this time, so neurology will sign off.  Please do not hesitate to reach out if additional questions or concerns arise  Baldwin Levee MD-PhD [facility] [phone] Available 7 AM to 7 PM, outside these hours please contact Neurologist on call listed on AMION  No charge plan of care note

## 2024-04-13 NOTE — Progress Notes (Signed)
 eLink Physician-Brief Progress Note Patient Name: Timothy [name] DOB: 1995/11/25 MRN: [id number]   Date of Service  04/13/2024  HPI/Events of Note  Patient complaining of 10/10 generalized pain. "i hurt all over"   Has PO tylenol  but refuses to take it because he feels he cannot swallow it.  Also receiving Toradol  but remained agitated after receiving it around 4 hours ago.  On 1.2 of precedex. Still having anxiety and agitation needing versed PRN.  Patient seen sitting upright in bed being fed  eICU Interventions  RN spoke with him and he agreed to receive pain med orally if its stronger Percocet 5/325 mg PO q4 ordered x 2 doses     Intervention Category Intermediate Interventions: Pain - evaluation and management Minor Interventions: Agitation / anxiety - evaluation and management  [name] 04/13/2024, 8:41 PM

## 2024-04-13 NOTE — Progress Notes (Signed)
 [facility] ADULT ICU REPLACEMENT PROTOCOL   The patient does apply for the [facility] Adult ICU Electrolyte Replacment Protocol based on the criteria listed below:   1.Exclusion criteria: TCTS, ECMO, Dialysis, and Myasthenia Gravis patients 2. Is GFR >/= 30 ml/min? Yes.    Patient's GFR today is >60 3. Is SCr </= 2? Yes.   Patient's SCr is 0.86 mg/dL 4. Did SCr increase >/= 0.5 in 24 hours? No. 5.Pt's weight >40kg  Yes.   6. Abnormal electrolyte(s): Mag  7. Electrolytes replaced per protocol 8.  Call MD STAT for K+ </= 2.5, Phos </= 1, or Mag </= 1 Physician:  Allegra Arch Lameisha Schuenemann 04/13/2024 3:24 AM

## 2024-04-13 NOTE — Procedures (Signed)
 Patient Name: ERIKSON DANZY  MRN: [id number]  Epilepsy Attending: Arleene Lack  Referring Physician/Provider: Khaliqdina, Salman, MD  Duration: 04/12/2024 1045 to 1447  Patient history: 28 y.o. male with hx of alcohol use, marijuana use and complicated by hyperemesis, seizures as a kid who presents with seizure while shopping at [facility] with resultant fall and trace traumatic subdural hematoma.  EEG to evaluate for seizure  Level of alertness: comatose  AEDs during EEG study: LEV, Phenobarb, propofol  Technical aspects: This EEG study was done with scalp electrodes positioned according to the 10-20 International system of electrode placement. Electrical activity was reviewed with band pass filter of 1-70Hz , sensitivity of 7 uV/mm, display speed of 25mm/sec with a 60Hz  notched filter applied as appropriate. EEG data were recorded continuously and digitally stored.  Video monitoring was available and reviewed as appropriate.  Description: EEG showed continuous generalized polymorphic sharply contoured 3 to 6 Hz theta-delta slowing admixed with an excessive amount of 15 to 18 Hz beta activity distributed symmetrically and diffusely. Hyperventilation and photic stimulation were not performed.     ABNORMALITY - Continuous slow, generalized  IMPRESSION: This study is suggestive of moderate to severe diffuse encephalopathy likely related to sedation. No seizures or epileptiform discharges were seen throughout the recording.  Sissi Padia O Takeisha Cianci

## 2024-04-13 NOTE — Progress Notes (Signed)
   NAME:  Timothy [name], MRN:  [id number], DOB:  Nov 23, 1995, LOS: 2 ADMISSION DATE:  04/11/2024, CONSULTATION DATE:  04/11/24  REFERRING MD: Bryna Car CHIEF COMPLAINT:  Seizures   History of Present Illness:  Pt is a 28yr old male with a past medical significant of seizures, ETOH abuse, cannabis use disorder who presents to [facility] after hitting his head while having a seizure in a local store. EMS was called, and had to give versed due having another seizure. Upon arrival to [facility] ED, patient had 2 more seizures ultimately given IV ativan  and loaded with keppra. Unfortunately, patient unable to protect airway and was intubated. CT of head completed showing- Trace subdural hematoma along the anterior falx measuring 1-2 mm in thickness. No adjacent mass effect or  acute fracture cervical spine.  Tox screen positive for benzos and THC. Alcohol level 38. PCCM was consulted for admission and assist with ventilator management.   Pertinent  Medical History   Past Medical History:  Diagnosis Date   Cannabinoid hyperemesis syndrome    Febrile seizures (HCC)    last at 58 y of age   Withdrawal seizures (HCC)      Significant Hospital Events: Including procedures, antibiotic start and stop dates in addition to other pertinent events   5/23 admit with Seizures, intubated for airway protection, started on propofol and placed on continuous EEG. 5/23 MRI shows evidence of small SDH and cerebral contusion.   Interim History / Subjective:  Sedation weaned off with no recurrent seizures. Patient awake and following commands. Extubated.   Objective    Blood pressure 119/87, pulse 69, temperature 98 F (36.7 C), temperature source Axillary, resp. rate 15, height 5\' 4"  (1.626 m), weight 65.9 kg, SpO2 99%.    SpO2: 99 % O2 Flow Rate (L/min): 2 L/min FiO2 (%): 30 %    Intake/Output Summary (Last 24 hours) at 04/13/2024 0917 Last data filed at 04/13/2024 0800 Gross per 24 hour  Intake 1100.21 ml   Output 1425 ml  Net -324.79 ml   Filed Weights   04/11/24 1748 04/12/24 0101 04/13/24 0500  Weight: 72.6 kg 65.4 kg 65.9 kg    Examination: General: average build. Calm HEENT: No icterus. No visible trauma.  CV: HS normal  pulm: clear, no distress  Abs: bs active, soft  Extremities: no edema, no deformity  Skin: no rash  Neuro: Awake and communicative but speech still a little slowed.  No tremor GU: foley intact, yellow urine   Continuous EEG shows generalized slowing. No seizures  Ancillary tests personally reviewed:  MRI shows small SDH and cerebral contusion.   Assessment and Plan  Status Epilepticus  Hx of Febrile Seizures as child  Subdural Hematoma  Acute Hypoxic Respiratory Failure in setting of above  ETOH abuse  Cannibis Use Disorder  Hx of Cannabinoid hyperemesis  Elevate Liver Enzymes  Plan:  -Progressive ambulation.  - Continue current anticonvulsants.  - Short phenobarbital course to prevent alcohol withdrawal.  - Progressive ambulation. May be ready for discharge tomorrow.   Best Practice (right click and "Reselect all SmartList Selections" daily)   Diet/type: Regular diet  DVT prophylaxis SCD Pressure ulcer(s): N/A GI prophylaxis: N/A Lines: N/A Foley:  remove. Code Status:  full code Last date of multidisciplinary goals of care discussion [ family updated at the bedside. ]   Arlina Lair, MD [facility] ICU Physician [facility] [place] [facility]  Pager: [phone] Mobile: [phone] After hours: [phone].

## 2024-04-14 ENCOUNTER — Encounter: Payer: Self-pay | Admitting: Pulmonary Disease

## 2024-04-14 ENCOUNTER — Encounter (HOSPITAL_COMMUNITY): Payer: Self-pay

## 2024-04-14 LAB — CBC
HCT: 41.2 % (ref 39.0–52.0)
Hemoglobin: 14.1 g/dL (ref 13.0–17.0)
MCH: 32.3 pg (ref 26.0–34.0)
MCHC: 34.2 g/dL (ref 30.0–36.0)
MCV: 94.5 fL (ref 80.0–100.0)
Platelets: 170 10*3/uL (ref 150–400)
RBC: 4.36 MIL/uL (ref 4.22–5.81)
RDW: 12.7 % (ref 11.5–15.5)
WBC: 9.2 10*3/uL (ref 4.0–10.5)
nRBC: 0 % (ref 0.0–0.2)

## 2024-04-14 LAB — BASIC METABOLIC PANEL WITH GFR
Anion gap: 11 (ref 5–15)
BUN: 5 mg/dL — ABNORMAL LOW (ref 6–20)
CO2: 22 mmol/L (ref 22–32)
Calcium: 9.3 mg/dL (ref 8.9–10.3)
Chloride: 101 mmol/L (ref 98–111)
Creatinine, Ser: 0.84 mg/dL (ref 0.61–1.24)
GFR, Estimated: >60 mL/min (ref >60)
Glucose, Bld: 95 mg/dL (ref 70–99)
Potassium: 3.4 mmol/L — ABNORMAL LOW (ref 3.5–5.1)
Sodium: 134 mmol/L — ABNORMAL LOW (ref 135–145)

## 2024-04-14 LAB — GLUCOSE, CAPILLARY
Glucose-Capillary: 128 mg/dL — ABNORMAL HIGH (ref 70–99)
Glucose-Capillary: 141 mg/dL — ABNORMAL HIGH (ref 70–99)

## 2024-04-14 LAB — MAGNESIUM: Magnesium: 1.8 mg/dL (ref 1.7–2.4)

## 2024-04-14 MED ORDER — POTASSIUM CHLORIDE CRYS ER 20 MEQ PO TBCR
40.0000 meq | EXTENDED_RELEASE_TABLET | Freq: Once | ORAL | Status: AC
  Administered 2024-04-14: 40 meq via ORAL
  Filled 2024-04-14: qty 2

## 2024-04-14 MED ORDER — ORAL CARE MOUTH RINSE: 15.0000 mL | OROMUCOSAL | Status: DC | PRN

## 2024-04-14 MED ORDER — LEVETIRACETAM 500 MG PO TABS: 500.0000 mg | ORAL_TABLET | Freq: Two times a day (BID) | ORAL | 5 refills | Status: DC

## 2024-04-14 MED ORDER — MAGIC MOUTHWASH W/LIDOCAINE
1.0000 mL | Freq: Once | ORAL | Status: AC
  Administered 2024-04-14: 1 mL via ORAL
  Filled 2024-04-14: qty 5

## 2024-04-14 MED ORDER — MAGNESIUM SULFATE 2 GM/50ML IV SOLN
2.0000 g | Freq: Once | INTRAVENOUS | Status: AC
  Administered 2024-04-14: 2 g via INTRAVENOUS
  Filled 2024-04-14: qty 50

## 2024-04-14 NOTE — TOC CM/SW Note (Signed)
 Transition of Care [facility]) - Inpatient Brief Assessment   Patient Details  Name: Timothy [name] MRN: [id number] Date of Birth: 1996-10-10  Transition of Care [facility]) CM/SW Contact:    Jennett Model, RN Phone Number: 04/14/2024, 12:27 PM   Clinical Narrative: From home with mom, has no  PCP, mom says since it is holiday she will make apt for son and has insurance on file, states has no HH services in place at this time or DME at home.  States family member will transport them home at [employer] and family is support system, states gets medications from [place].  Pta self ambulatory. Per pt eval rec outpatient physical therapy, NCM made referral thru epic for outpatient PT at [facility].  Transition of Care Asessment: Insurance and Status: Insurance coverage has been reviewed Patient has primary care physician: Yes Home environment has been reviewed: lives withmom Prior level of function:: indep Prior/Current Home Services: No current home services Social Drivers of Health Review: SDOH reviewed no interventions necessary Readmission risk has been reviewed: Yes Transition of care needs: transition of care needs identified, TOC will continue to follow

## 2024-04-14 NOTE — Plan of Care (Signed)

## 2024-04-14 NOTE — Evaluation (Signed)
 Occupational Therapy Evaluation Patient Details Name: Timothy [name] MRN: [id number] DOB: 1996/05/24 Today's Date: 04/14/2024   History of Present Illness   28 yo male admitted 04/12/24 after seizure with fall and SDH. VDRF 5/24-5/25. PMhx: seizures, ETOH abuse, cannabis use disorder     Clinical Impressions Pt ind and working at baseline, lives with his mother. Pt currently close to baseline performing ADLs without assist, and mobility independently. Pt performing SBT without error, reports just weak due to pain in spine, head, sides, and tongue from fall/seizures. Pt presenting with impairments listed below, however has no further OT needs at this time. Will s/o. Please reconsult if there is  a change in pt status.     If plan is discharge home, recommend the following:   Assistance with cooking/housework     Functional Status Assessment   Patient has had a recent decline in their functional status and demonstrates the ability to make significant improvements in function in a reasonable and predictable amount of time.     Equipment Recommendations   None recommended by OT     Recommendations for Other Services         Precautions/Restrictions   Precautions Precautions: Fall Recall of Precautions/Restrictions: Intact Restrictions Weight Bearing Restrictions Per Provider Order: No     Mobility Bed Mobility Overal bed mobility: Modified Independent                  Transfers Overall transfer level: Modified independent                        Balance Overall balance assessment: Mild deficits observed, not formally tested                                         ADL either performed or assessed with clinical judgement   ADL Overall ADL's : At baseline                                       General ADL Comments: ambulates, performs LB ADL and IADL without assist     Vision   Vision Assessment?: No  apparent visual deficits     Perception Perception: Not tested       Praxis Praxis: Not tested       Pertinent Vitals/Pain Pain Assessment Pain Assessment: Faces Pain Score: 4  Faces Pain Scale: Hurts little more Pain Location: head, spine, sides Pain Descriptors / Indicators: Discomfort Pain Intervention(s): Limited activity within patient's tolerance, Monitored during session, Repositioned     Extremity/Trunk Assessment Upper Extremity Assessment Upper Extremity Assessment: Overall WFL for tasks assessed   Lower Extremity Assessment Lower Extremity Assessment: Overall WFL for tasks assessed   Cervical / Trunk Assessment Cervical / Trunk Assessment: Normal   Communication Communication Communication: No apparent difficulties   Cognition Arousal: Alert Behavior During Therapy: WFL for tasks assessed/performed               OT - Cognition Comments: pt without error on SBT                         Cueing  General Comments   Cueing Techniques: Verbal cues  VSS   Exercises     Shoulder Instructions  Home Living Family/patient expects to be discharged to:: Private residence Living Arrangements: Parent Available Help at Discharge: Family;Available 24 hours/day Type of Home: House Home Access: Level entry     Home Layout: One level     Bathroom Shower/Tub: Chief Strategy Officer: Standard     Home Equipment: None          Prior Functioning/Environment Prior Level of Function : Independent/Modified Independent;Driving;Working/employed               ADLs Comments: ind, 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    OT Problem List:     OT Treatment/Interventions:        OT Goals(Current goals can be found in the care plan section)   Acute Rehab OT Goals Patient Stated Goal: none stated OT Goal Formulation: With patient Time For Goal Achievement: 04/28/24 Potential to Achieve Goals: Good   OT Frequency:        Co-evaluation              AM-PAC OT "6 Clicks" Daily Activity     Outcome Measure Help from another person eating meals?: None Help from another person taking care of personal grooming?: None Help from another person toileting, which includes using toliet, bedpan, or urinal?: None Help from another person bathing (including washing, rinsing, drying)?: None Help from another person to put on and taking off regular upper body clothing?: None Help from another person to put on and taking off regular lower body clothing?: None 6 Click Score: 24   End of Session Equipment Utilized During Treatment: Gait belt Nurse Communication: Mobility status  Activity Tolerance: Patient tolerated treatment well Patient left: in bed;with call bell/phone within reach  OT Visit Diagnosis: Muscle weakness (generalized) (M62.81)                Time: 1610-9604 OT Time Calculation (min): 15 min Charges:  OT General Charges $OT Visit: 1 Visit OT Evaluation $OT Eval Low Complexity: 1 Low  Tariya Morrissette K, OTD, OTR/L SecureChat Preferred Acute Rehab [phone]   Tino Ronan K Koonce 04/14/2024, 10:54 AM

## 2024-04-14 NOTE — Progress Notes (Signed)
  04/14/2024 MRN: [id number]   [name] [address] [place] [address]  To whom it may concern:    It is my medical opinion that Timothy [name] be held out of work for the next 2 weeks starting 04/11/2024.  If you have any questions or concerns, please do not hesitate to call.  Sincerely,   Arlina Lair, MD [facility] ICU Physician [facility] [place] [facility]  Pager: [phone] Or Epic Secure Chat After hours: [phone].  04/14/2024, 12:10 PM

## 2024-04-14 NOTE — Evaluation (Signed)
 Physical Therapy Evaluation/ Discharge Patient Details Name: Timothy [name] MRN: [id number] DOB: 07-Mar-1996 Today's Date: 04/14/2024  History of Present Illness  28 yo male admitted 04/12/24 after seizure with fall and SDH. VDRF 5/24-5/25. 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 Pt lives with mom and has assist as needed. Per mom, pt cognition at baseline despite noted deficits in recall and problem solving. Pt with high level balance deficits who would benefit from OPPT. No further acute P.T. required at this time. Will sign off with pt and mom aware and agreeable.         If plan is discharge home, recommend the following: Assist for transportation   Can travel by private vehicle        Equipment Recommendations None recommended by PT  Recommendations for Other Services       Functional Status Assessment Patient has not had a recent decline in their functional status     Precautions / Restrictions Precautions Precautions: Fall Recall of Precautions/Restrictions: Intact      Mobility  Bed Mobility Overal bed mobility: Modified Independent                  Transfers Overall transfer level: Modified independent                      Ambulation/Gait Ambulation/Gait assistance: Independent Gait Distance (Feet): 400 Feet Assistive device: None Gait Pattern/deviations: WFL(Within Functional Limits)   Gait velocity interpretation: >4.37 ft/sec, indicative of normal walking speed   General Gait Details: pt able to perform head turns, change of direction and speed  Stairs Stairs: Yes Stairs assistance: Independent Stair Management: Alternating pattern, Forwards, No rails Number of Stairs: 11    Wheelchair Mobility     Tilt Bed    Modified Rankin (Stroke Patients Only)       Balance Overall balance assessment:  Mild deficits observed, not formally tested                               Standardized Balance Assessment Standardized Balance Assessment : Dynamic Gait Index   Dynamic Gait Index Level Surface: Normal Change in Gait Speed: Normal Gait with Horizontal Head Turns: Normal Gait with Vertical Head Turns: Normal Gait and Pivot Turn: Normal Step Over Obstacle: Normal Step Around Obstacles: Normal Steps: Normal Total Score: 24       Pertinent Vitals/Pain Pain Assessment Pain Assessment: No/denies pain    Home Living Family/patient expects to be discharged to:: Private residence Living Arrangements: Parent Available Help at Discharge: Family;Available 24 hours/day Type of Home: House Home Access: Level entry       Home Layout: One level Home Equipment: None      Prior Function Prior Level of Function : Independent/Modified Independent;Driving;Working/employed                     Extremity/Trunk Assessment   Upper Extremity Assessment Upper Extremity Assessment: Overall WFL for tasks assessed    Lower Extremity Assessment Lower Extremity Assessment: Overall WFL for tasks assessed    Cervical / Trunk Assessment Cervical / Trunk Assessment: Normal  Communication   Communication Communication: No apparent difficulties    Cognition Arousal: Alert Behavior During Therapy: Flat affect   PT - Cognitive impairments: Memory  PT - Cognition Comments: pt initially stating walkin in but mom correcting to tub/shower. Pt oriented and aware of president, could not recall room number         Cueing Cueing Techniques: Verbal cues     General Comments General comments (skin integrity, edema, etc.): pt without deficit on DGI however with single limb stance able to maintain 3-5 sec with increased sway and effort, sharpened rhomberg 12 sec before LOB. Rhomberg 30 sec eyes closed    Exercises     Assessment/Plan    PT  Assessment All further PT needs can be met in the next venue of care  PT Problem List Decreased balance       PT Treatment Interventions      PT Goals (Current goals can be found in the Care Plan section)  Acute Rehab PT Goals PT Goal Formulation: All assessment and education complete, DC therapy    Frequency       Co-evaluation               AM-PAC PT "6 Clicks" Mobility  Outcome Measure Help needed turning from your back to your side while in a flat bed without using bedrails?: None Help needed moving from lying on your back to sitting on the side of a flat bed without using bedrails?: None Help needed moving to and from a bed to a chair (including a wheelchair)?: None Help needed standing up from a chair using your arms (e.g., wheelchair or bedside chair)?: None Help needed to walk in hospital room?: A Little Help needed climbing 3-5 steps with a railing? : A Little 6 Click Score: 22    End of Session Equipment Utilized During Treatment: Gait belt Activity Tolerance: Patient tolerated treatment well Patient left: in chair;with call bell/phone within reach;with chair alarm set;with family/visitor present Nurse Communication: Mobility status PT Visit Diagnosis: Other abnormalities of gait and mobility (R26.89)    Time: 1610-9604 PT Time Calculation (min) (ACUTE ONLY): 21 min   Charges:   PT Evaluation $PT Eval Low Complexity: 1 Low   PT General Charges $$ ACUTE PT VISIT: 1 Visit         Annis Baseman, PT Acute Rehabilitation Services Office: [phone]   Jackey Mary Philmore Lepore 04/14/2024, 9:50 AM

## 2024-04-14 NOTE — Discharge Summary (Addendum)
 Physician Discharge Summary  Patient ID: Timothy [name] MRN: [id number] DOB/AGE: Jan 18, 1996 27 y.o.  Admit date: 04/11/2024 Discharge date: 04/14/2024  Admission Diagnoses:  status epilepticus  Discharge Diagnoses:  Principal Problem:   Seizure [facility]) Active Problems:   Seizures (HCC) Closed head injury Alcohol use disorder.  Discharged Condition: good  Hospital Course: Was admitted following prolonged seizure at home likely due to alcohol withdrawal. Imaging showed small SDH and contusion which remained stable. Seizures settled after intubation and propofol and keppra load.  Extubated. Initial agitation settled with phenobarbital taper and dexmedetomidine.   Consults: pulmonary/intensive care and neurology. Neurology recommended indefinite anti-seizure medications.   Significant Diagnostic Studies: radiology: MRI: stable small subdural and contusion.  Treatments: Anticonvulsants.   Discharge Exam: Blood pressure (!) 142/87, pulse 77, temperature 98.1 F (36.7 C), temperature source Oral, resp. rate 17, height 5\' 4"  (1.626 m), weight 64.8 kg, SpO2 97%. General appearance: alert and cooperative Head: Normocephalic, without obvious abnormality, atraumatic Throat: improving tongue laceration.  Resp: clear to auscultation bilaterally Chest wall: no tenderness Neurologic: Grossly normal  Disposition: Discharge disposition: 01-Home or Self Care        Allergies as of 04/14/2024       Reactions   Amoxil [amoxicillin] Rash        Medication List     STOP taking these medications    metoCLOPramide  10 MG tablet Commonly known as: Reglan    omeprazole  20 MG capsule Commonly known as: PRILOSEC    ondansetron  4 MG disintegrating tablet Commonly known as: ZOFRAN -ODT   ondansetron  4 MG tablet Commonly known as: ZOFRAN    potassium chloride  SA 20 MEQ tablet Commonly known as: KLOR-CON  M   prochlorperazine  10 MG tablet Commonly known as: COMPAZINE    promethazine   25 MG suppository Commonly known as: PHENERGAN        TAKE these medications    chlordiazePOXIDE  25 MG capsule Commonly known as: LIBRIUM  50mg  PO TID x 1D, then 25-50mg  PO BID X 1D, then 25-50mg  PO QD X 1D   levETIRAcetam 500 MG tablet Commonly known as: KEPPRA Take 1 tablet (500 mg total) by mouth 2 (two) times daily.   pantoprazole  40 MG tablet Commonly known as: Protonix  Take 1 tablet (40 mg total) by mouth daily.         Signed: Alexandr Yaworski 04/14/2024, 11:45 AM

## 2024-04-14 NOTE — Discharge Instructions (Signed)
 Your are not to drive for 6 months or until cleared by a neurologist.

## 2024-04-14 NOTE — TOC Transition Note (Signed)
 Transition of Care [facility]) - Discharge Note   Patient Details  Name: Timothy [name] MRN: [id number] Date of Birth: Sep 08, 1996  [facility]) CM/SW Contact:  Jennett Model, RN Phone Number: 04/14/2024, 12:29 PM   Clinical Narrative:    For dc, mom transporting home.         Patient Goals and CMS Choice            Discharge Placement                       Discharge Plan and Services Additional resources added to the After Visit Summary for                                       Social Drivers of Health (SDOH) Interventions SDOH Screenings   Food Insecurity: No Food Insecurity (04/12/2024)  Housing: Unknown (04/12/2024)  Transportation Needs: No Transportation Needs (04/12/2024)  Utilities: Not At Risk (04/12/2024)  Tobacco Use: High Risk (04/12/2024)     Readmission Risk Interventions    04/14/2024   12:26 PM  Readmission Risk Prevention Plan  Post Dischage Appt Complete  Medication Screening Complete  Transportation Screening Complete

## 2024-04-14 NOTE — Progress Notes (Signed)
 [facility] ADULT ICU REPLACEMENT PROTOCOL   The patient does apply for the [facility] Adult ICU Electrolyte Replacment Protocol based on the criteria listed below:   1.Exclusion criteria: TCTS, ECMO, Dialysis, and Myasthenia Gravis patients 2. Is GFR >/= 30 ml/min? Yes.    Patient's GFR today is >60 3. Is SCr </= 2? Yes.   Patient's SCr is 0.84 mg/dL 4. Did SCr increase >/= 0.5 in 24 hours? No. 5.Pt's weight >40kg  Yes.   6. Abnormal electrolyte(s): K, Mag  7. Electrolytes replaced per protocol 8.  Call MD STAT for K+ </= 2.5, Phos </= 1, or Mag </= 1 Physician:  Asencion Blacksmith [facility] 04/14/2024 3:39 AM

## 2024-04-14 NOTE — Progress Notes (Signed)
 Patient transferred to home with Mom and has discharge instructions

## 2024-04-15 ENCOUNTER — Other Ambulatory Visit: Payer: Self-pay | Admitting: Student

## 2024-04-15 MED ORDER — CHLORDIAZEPOXIDE HCL 25 MG PO CAPS: ORAL_CAPSULE | ORAL | 0 refills | Status: DC

## 2024-04-15 NOTE — Progress Notes (Signed)
 Librium  prescription had not been sent in, will resend.

## 2024-04-16 DIAGNOSIS — F1210 Cannabis abuse, uncomplicated: Secondary | ICD-10-CM | POA: Diagnosis not present

## 2024-04-16 DIAGNOSIS — F17210 Nicotine dependence, cigarettes, uncomplicated: Secondary | ICD-10-CM | POA: Diagnosis not present

## 2024-04-16 DIAGNOSIS — F1090 Alcohol use, unspecified, uncomplicated: Secondary | ICD-10-CM | POA: Diagnosis not present

## 2024-04-17 LAB — CULTURE, BLOOD (ROUTINE X 2)
Culture: NO GROWTH
Culture: NO GROWTH
Special Requests: ADEQUATE
Special Requests: ADEQUATE

## 2024-05-14 ENCOUNTER — Ambulatory Visit: Admitting: Neurology

## 2024-05-14 ENCOUNTER — Telehealth: Payer: Self-pay | Admitting: Neurology

## 2024-05-14 NOTE — Telephone Encounter (Signed)
 Request to r/s appointment , pt unable to come today

## 2024-05-23 ENCOUNTER — Encounter (HOSPITAL_COMMUNITY): Payer: Self-pay

## 2024-05-23 ENCOUNTER — Emergency Department (HOSPITAL_COMMUNITY)
Admission: EM | Admit: 2024-05-23 | Discharge: 2024-05-24 | Disposition: A | Discharge status: Home / Self Care | Attending: Emergency Medicine | Admitting: Emergency Medicine

## 2024-05-23 ENCOUNTER — Other Ambulatory Visit: Payer: Self-pay

## 2024-05-23 DIAGNOSIS — R569 Unspecified convulsions: Secondary | ICD-10-CM | POA: Diagnosis not present

## 2024-05-23 DIAGNOSIS — I10 Essential (primary) hypertension: Secondary | ICD-10-CM | POA: Diagnosis not present

## 2024-05-23 DIAGNOSIS — R110 Nausea: Secondary | ICD-10-CM | POA: Insufficient documentation

## 2024-05-23 DIAGNOSIS — F17210 Nicotine dependence, cigarettes, uncomplicated: Secondary | ICD-10-CM | POA: Insufficient documentation

## 2024-05-23 DIAGNOSIS — R000 Tachycardia, unspecified: Secondary | ICD-10-CM | POA: Diagnosis not present

## 2024-05-23 DIAGNOSIS — R410 Disorientation, unspecified: Secondary | ICD-10-CM | POA: Diagnosis not present

## 2024-05-23 LAB — CBC WITH DIFFERENTIAL/PLATELET
Abs Immature Granulocytes: 0.03 K/uL (ref 0.00–0.07)
Basophils Absolute: 0 K/uL (ref 0.0–0.1)
Basophils Relative: 1 %
Eosinophils Absolute: 0.1 K/uL (ref 0.0–0.5)
Eosinophils Relative: 2 %
HCT: 42.9 % (ref 39.0–52.0)
Hemoglobin: 14.6 g/dL (ref 13.0–17.0)
Immature Granulocytes: 0 %
Lymphocytes Relative: 28 %
Lymphs Abs: 1.9 K/uL (ref 0.7–4.0)
MCH: 33 pg (ref 26.0–34.0)
MCHC: 34 g/dL (ref 30.0–36.0)
MCV: 96.8 fL (ref 80.0–100.0)
Monocytes Absolute: 0.8 K/uL (ref 0.1–1.0)
Monocytes Relative: 12 %
Neutro Abs: 3.9 K/uL (ref 1.7–7.7)
Neutrophils Relative %: 57 %
Platelets: 202 K/uL (ref 150–400)
RBC: 4.43 MIL/uL (ref 4.22–5.81)
RDW: 13.7 % (ref 11.5–15.5)
WBC: 6.7 K/uL (ref 4.0–10.5)
nRBC: 0 % (ref 0.0–0.2)

## 2024-05-23 LAB — COMPREHENSIVE METABOLIC PANEL WITH GFR
ALT: 43 U/L (ref 0–44)
AST: 59 U/L — ABNORMAL HIGH (ref 15–41)
Albumin: 4.3 g/dL (ref 3.5–5.0)
Alkaline Phosphatase: 89 U/L (ref 38–126)
Anion gap: 14 (ref 5–15)
BUN: 8 mg/dL (ref 6–20)
CO2: 19 mmol/L — ABNORMAL LOW (ref 22–32)
Calcium: 9.6 mg/dL (ref 8.9–10.3)
Chloride: 99 mmol/L (ref 98–111)
Creatinine, Ser: 0.8 mg/dL (ref 0.61–1.24)
GFR, Estimated: >60 mL/min (ref >60)
Glucose, Bld: 77 mg/dL (ref 70–99)
Potassium: 3.3 mmol/L — ABNORMAL LOW (ref 3.5–5.1)
Sodium: 132 mmol/L — ABNORMAL LOW (ref 135–145)
Total Bilirubin: 3.2 mg/dL — ABNORMAL HIGH (ref 0.0–1.2)
Total Protein: 7.4 g/dL (ref 6.5–8.1)

## 2024-05-23 LAB — MAGNESIUM: Magnesium: 2 mg/dL (ref 1.7–2.4)

## 2024-05-23 LAB — CBG MONITORING, ED: Glucose-Capillary: 84 mg/dL (ref 70–99)

## 2024-05-23 MED ORDER — SODIUM CHLORIDE 0.9 % IV SOLN
200.0000 mg | Freq: Once | INTRAVENOUS | Status: AC
  Administered 2024-05-23: 200 mg via INTRAVENOUS
  Filled 2024-05-23: qty 20

## 2024-05-23 MED ORDER — LACOSAMIDE 200 MG/20ML IV SOLN
INTRAVENOUS | Status: AC
  Filled 2024-05-23: qty 20

## 2024-05-23 MED ORDER — POTASSIUM CHLORIDE CRYS ER 20 MEQ PO TBCR
40.0000 meq | EXTENDED_RELEASE_TABLET | Freq: Once | ORAL | Status: AC
  Administered 2024-05-23: 40 meq via ORAL
  Filled 2024-05-23: qty 2

## 2024-05-23 MED ORDER — LEVETIRACETAM (KEPPRA) 500 MG/5 ML ADULT IV PUSH
1500.0000 mg | Freq: Once | INTRAVENOUS | Status: DC
  Filled 2024-05-23: qty 15

## 2024-05-23 MED ORDER — ONDANSETRON HCL 4 MG/2ML IJ SOLN
4.0000 mg | Freq: Once | INTRAMUSCULAR | Status: AC
Start: 2024-05-23 — End: 2024-05-23
  Administered 2024-05-23: 4 mg via INTRAVENOUS
  Filled 2024-05-23: qty 2

## 2024-05-23 MED ORDER — SODIUM CHLORIDE 0.9 % IV BOLUS
1000.0000 mL | Freq: Once | INTRAVENOUS | Status: AC
  Administered 2024-05-23: 1000 mL via INTRAVENOUS

## 2024-05-23 MED ORDER — LACOSAMIDE 100 MG PO TABS
100.0000 mg | ORAL_TABLET | Freq: Two times a day (BID) | ORAL | 0 refills | Status: DC
Start: 2024-05-23 — End: 2024-05-27

## 2024-05-23 MED ORDER — ACETAMINOPHEN 500 MG PO TABS
1000.0000 mg | ORAL_TABLET | Freq: Once | ORAL | Status: AC
  Administered 2024-05-23: 1000 mg via ORAL
  Filled 2024-05-23: qty 2

## 2024-05-23 NOTE — ED Triage Notes (Addendum)
 BIB by [facility] from home. EMS arrived post seizure. Family stated, seizure last 45sec-80min. He is having them a lot more. Last one was during [place] day.  Pt A&Ox4 and ambulatory. Bit tongue during seizure. Tongue is swollen and not bleeding at this time. Alcohol and marijuana usage. Denies pain at this time.

## 2024-05-23 NOTE — ED Notes (Signed)
 Pt refused keppra  stated, it makes me hallucinate.

## 2024-05-23 NOTE — Discharge Instructions (Addendum)
 We evaluated you for your seizure.  Your testing in the emergency department is reassuring.  We gave you a dose of a different seizure medicine.  Do not take this seizure medicine and Keppra  at the same time.  We have placed another referral for neurology.  Please be sure to follow-up with the neurologist.  If you have any new or worsening symptoms such as recurrent seizures, severe headaches, vomiting, lightheadedness or dizziness, fevers or chills, neck pain or stiffness, or any other new symptoms, please return to the emergency department.

## 2024-05-23 NOTE — ED Provider Notes (Signed)
 [place] EMERGENCY DEPARTMENT AT [facility] Provider Note  CSN: [id number] Arrival date & time: 05/23/24 2103  Chief Complaint(s) Seizures  HPI Timothy [name] is a 28 y.o. male history of seizure disorder presenting to the emergency department with seizure.  Was witnessed by family member.  He stiffened up, then fell over and had shaking.  Did not have hard head strike that she witnessed.  Afterwards he was confused.  Did drink some alcohol today.  He also here with his mother who reports he is not taking any of his seizure medicines.  He reports he ran out.  Currently, reports some mild nausea, pain in the tongue, denies other symptoms.  No pain in the arms or legs, chest, abdomen, back, head.  Otherwise has been doing well with no other recent symptoms like fevers or chills, painful urination, cough, runny nose, sore throat.   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 Authorizing Provider  Lacosamide  (VIMPAT ) 100 MG TABS Take 1 tablet (100 mg total) by mouth in the morning and at bedtime. 05/23/24  Yes Francesca Elsie CROME, MD  chlordiazePOXIDE  (LIBRIUM ) 25 MG capsule 50mg  PO TID x 1D, then 25-50mg  PO BID X 1D, then 25-50mg  PO QD X 1D 04/15/24   Nooruddin, Saad, MD  pantoprazole  (PROTONIX ) 40 MG tablet Take 1 tablet (40 mg total) by mouth daily. Patient not taking: Reported on 04/11/2024 06/16/23   Suellen Sherran DELENA DEVONNA                                                                                                                                     Past Surgical History History reviewed. No pertinent surgical history. Family History Family History  Problem Relation Age of Onset   Diabetes Maternal Grandmother     Social History Social History   Tobacco Use   Smoking status: Heavy Smoker    Current packs/day: 0.50    Types: Cigarettes   Smokeless tobacco: Never  Vaping Use   Vaping status: Never Used  Substance Use Topics   Alcohol use: Yes    Alcohol/week: 84.0 standard drinks of alcohol    Types: 84 Cans of beer per week    Comment: 2-3 beers daily   Drug use: Yes    Types: Marijuana    Comment: smokes every day   Allergies Amoxil [amoxicillin]  Review of Systems Review of Systems  All other systems reviewed and are negative.  Physical Exam Vital Signs  I have reviewed the triage vital signs BP (!) 139/93   Pulse 89   Temp 98.6 F (37 C) (Oral)   Resp 18   Ht 5' 4 (1.626 m)   Wt 68 kg   SpO2 97%   BMI 25.75 kg/m  Physical Exam Vitals and nursing note reviewed.  Constitutional:      General: He is not in acute distress.    Appearance: Normal appearance.  HENT:     Head: Normocephalic and atraumatic.     Mouth/Throat:     Mouth: Mucous membranes are moist.     Comments: Small right anterior tongue laceration Eyes:     Conjunctiva/sclera: Conjunctivae normal.  Cardiovascular:     Rate and Rhythm: Normal rate and regular rhythm.  Pulmonary:     Effort: Pulmonary effort is normal. No respiratory distress.     Breath sounds: Normal breath sounds.  Abdominal:     General: Abdomen is flat.     Palpations: Abdomen is soft.     Tenderness: There is no abdominal tenderness.  Musculoskeletal:     Right lower leg: No edema.     Left lower leg: No edema.     Comments: No midline C, T, L-spine tenderness.  No chest wall tenderness or crepitus.  Full painless range of motion at the bilateral upper extremities including the shoulders, elbows, wrists, hand and fingers, and in the bilateral lower  extremities including the hips, knees, ankle, toes.  No focal bony tenderness, injury or deformity.  Skin:    General: Skin is warm and dry.     Capillary Refill: Capillary refill takes less than 2 seconds.  Neurological:     Mental Status: He is alert and oriented to person, place, and time. Mental status is at baseline.  Psychiatric:        Mood and Affect: Mood normal.        Behavior: Behavior normal.     ED Results and Treatments Labs (all labs ordered are listed, but only abnormal results are displayed) Labs Reviewed  COMPREHENSIVE METABOLIC PANEL WITH GFR - Abnormal; Notable for the following components:      Result Value   Sodium 132 (*)    Potassium 3.3 (*)    CO2 19 (*)    AST 59 (*)    Total Bilirubin 3.2 (*)    All other components within normal limits  CBC WITH DIFFERENTIAL/PLATELET  MAGNESIUM   URINALYSIS, W/ REFLEX TO CULTURE (INFECTION SUSPECTED)  RAPID URINE DRUG SCREEN, HOSP PERFORMED  CBG MONITORING, ED                                                                                                                          Radiology No results found.  Pertinent labs & imaging results that were available during my care of the patient were reviewed by me and considered in my medical decision making (see MDM for details).  Medications Ordered  in ED Medications  lacosamide  (VIMPAT ) 200 mg in sodium chloride  0.9 % 25 mL IVPB (200 mg Intravenous New Bag/Given 05/23/24 2311)  potassium chloride  SA (KLOR-CON  M) CR tablet 40 mEq (has no administration in time range)  sodium chloride  0.9 % bolus 1,000 mL (1,000 mLs Intravenous New Bag/Given 05/23/24 2201)  ondansetron  (ZOFRAN ) injection 4 mg (4 mg Intravenous Given 05/23/24 2201)  acetaminophen  (TYLENOL ) tablet 1,000 mg (1,000 mg Oral Given 05/23/24 2200)                                                                                                                                     Procedures Procedures  (including  critical care time)  Medical Decision Making / ED Course   MDM:  28 year old presenting to the emergency department with likely seizure.  Patient reports known history of seizure disorder and has not been taking his medication.  Seems to be likely precipitant.  Will check other labs to evaluate for other possible triggers such as toxic or metabolic abnormality, electrolyte derangement.  Patient denies any recent infectious symptoms like fevers or chills, cough.  He is back to his baseline.  He denies any pain or injuries from his seizure.  Was witnessed by family member who reports that he did not hit his head and he denies headache.  Do not think he needs neuroimaging at this time.  Will give dose of Keppra .  Will reassess.  If workup reassuring anticipate likely discharge with refill of Keppra .  Clinical Course as of 05/23/24 2329  Fri May 23, 2024  2327 Patient refused Keppra , reports that it gives him hallucinations.  This is what was prescribed to him on hospital discharge.  Discussed with Dr. Sharri regarding alternatives, reviewed patient's record, recommends to restart Vimpat .  Patient is agreeable to this.  He received a dose of Vimpat .  Laboratory testing overall reassuring otherwise.  Patient remains at baseline, no further seizures.  Discussed results with patient and his family.  Have placed additional neurology referral.  Have placed prescription for Vimpat .  Feel patient is stable for discharge following Vimpat  load. Will discharge patient to home. All questions answered. Patient comfortable with plan of discharge. Return precautions discussed with patient and specified on the after visit summary.  [WS]    Clinical Course User Index [WS] Francesca Elsie CROME, MD     Additional history obtained: -Additional history obtained from ems -External records from outside source obtained and reviewed including: Chart review including previous notes, labs, imaging, consultation notes  including prior notes    Lab Tests: -I ordered, reviewed, and interpreted labs.   The pertinent results include:   Labs Reviewed  COMPREHENSIVE METABOLIC PANEL WITH GFR - Abnormal; Notable for the following components:      Result Value   Sodium 132 (*)    Potassium 3.3 (*)    CO2 19 (*)    AST 59 (*)  Total Bilirubin 3.2 (*)    All other components within normal limits  CBC WITH DIFFERENTIAL/PLATELET  MAGNESIUM   URINALYSIS, W/ REFLEX TO CULTURE (INFECTION SUSPECTED)  RAPID URINE DRUG SCREEN, HOSP PERFORMED  CBG MONITORING, ED    Notable for mild hypokalemia. Nonspecific LFT abnormality without abdominal pain or tenderness  EKG   EKG Interpretation Date/Time:  Friday May 23 2024 21:14:49 EDT Ventricular Rate:  100 PR Interval:  128 QRS Duration:  96 QT Interval:  341 QTC Calculation: 440 R Axis:   65  Text Interpretation: Sinus tachycardia Probable left ventricular hypertrophy Confirmed by Francesca Fallow ([id number]) on 05/23/2024 9:52:26 PM          Medicines ordered and prescription drug management: Meds ordered this encounter  Medications   sodium chloride  0.9 % bolus 1,000 mL   ondansetron  (ZOFRAN ) injection 4 mg   acetaminophen  (TYLENOL ) tablet 1,000 mg   DISCONTD: levETIRAcetam  (KEPPRA ) undiluted injection 1,500 mg   lacosamide  (VIMPAT ) 200 mg in sodium chloride  0.9 % 25 mL IVPB   Lacosamide  (VIMPAT ) 100 MG TABS    Sig: Take 1 tablet (100 mg total) by mouth in the morning and at bedtime.    Dispense:  60 tablet    Refill:  0   potassium chloride  SA (KLOR-CON  M) CR tablet 40 mEq    -I have reviewed the patients home medicines and have made adjustments as needed   Consultations Obtained: I requested consultation with the neurologist,  and discussed lab and imaging findings as well as pertinent plan - they recommend: vimpat    Cardiac Monitoring: The patient was maintained on a cardiac monitor.  I personally viewed and interpreted the cardiac monitored  which showed an underlying rhythm of: NSR  Social Determinants of Health:  Diagnosis or treatment significantly limited by social determinants of health: polysubstance abuse   Reevaluation: After the interventions noted above, I reevaluated the patient and found that their symptoms have improved  Co morbidities that complicate the patient evaluation  Past Medical History:  Diagnosis Date   Cannabinoid hyperemesis syndrome    Febrile seizures (HCC)    last at 57 y of age   Withdrawal seizures (HCC)       Dispostion: Disposition decision including need for hospitalization was considered, and patient discharged from emergency department.    Final Clinical Impression(s) / ED Diagnoses Final diagnoses:  Seizure [facility])     This chart was dictated using voice recognition software.  Despite best efforts to proofread,  errors can occur which can change the documentation meaning.    Francesca Fallow CROME, MD 05/23/24 2329

## 2024-05-23 NOTE — ED Notes (Signed)
 Pt unable to give urine at this time.

## 2024-05-27 ENCOUNTER — Ambulatory Visit

## 2024-05-27 VITALS — BP 119/75 | HR 103 | Ht 64.0 in | Wt 144.1 lb

## 2024-05-27 DIAGNOSIS — R569 Unspecified convulsions: Secondary | ICD-10-CM | POA: Diagnosis not present

## 2024-05-27 MED ORDER — LACOSAMIDE 100 MG PO TABS: 100.0000 mg | ORAL_TABLET | Freq: Two times a day (BID) | ORAL | 0 refills | Status: DC

## 2024-05-27 NOTE — Progress Notes (Signed)
 New Patient Office Visit  Subjective    Patient ID: Timothy [name], male    DOB: 05-19-1996  Age: 28 y.o. MRN: [id number]  CC:  Chief Complaint  Patient presents with   Medical Management of Chronic Issues   Establish Care    Pt here to establish care    HPI Timothy [name] presents to establish care Patient was seen in the ER on 05/23/24 for witnessed seizure by family members.   Pt declined Keppra  d/t reported hx of hallucinations with medication.   He was prescribed lacosamide  (Vimpat ), and reports good response with this medication and he denies any adverse side effects.   He has an appt with neurology scheduled in one month.    Outpatient Encounter Medications as of 05/27/2024  Medication Sig   [DISCONTINUED] Lacosamide  (VIMPAT ) 100 MG TABS Take 1 tablet (100 mg total) by mouth in the morning and at bedtime.   Lacosamide  (VIMPAT ) 100 MG TABS Take 1 tablet (100 mg total) by mouth in the morning and at bedtime.   [DISCONTINUED] chlordiazePOXIDE  (LIBRIUM ) 25 MG capsule 50mg  PO TID x 1D, then 25-50mg  PO BID X 1D, then 25-50mg  PO QD X 1D (Patient not taking: Reported on 05/27/2024)   [DISCONTINUED] pantoprazole  (PROTONIX ) 40 MG tablet Take 1 tablet (40 mg total) by mouth daily. (Patient not taking: Reported on 05/27/2024)   No facility-administered encounter medications on file as of 05/27/2024.    Past Medical History:  Diagnosis Date   Cannabinoid hyperemesis syndrome    Febrile seizures (HCC)    last at 69 y of age   Withdrawal seizures [facility])     History reviewed. No pertinent surgical history.  Family History  Problem Relation Age of Onset   Diabetes Maternal Grandmother     Social History   Socioeconomic History   Marital status: Single    Spouse name: Not on file   Number of children: Not on file   Years of education: Not on file   Highest education level: Not on file  Occupational History   Not on file  Tobacco Use   Smoking status: Heavy Smoker    Current  packs/day: 0.50    Types: Cigarettes   Smokeless tobacco: Never  Vaping Use   Vaping status: Never Used  Substance and Sexual Activity   Alcohol use: Yes    Alcohol/week: 84.0 standard drinks of alcohol    Types: 84 Cans of beer per week    Comment: 2-3 beers daily   Drug use: Yes    Types: Marijuana    Comment: smokes every day   Sexual activity: Yes    Birth control/protection: Condom  Other Topics Concern   Not on file  Social History Narrative   Not on file   Social Drivers of Health   Financial Resource Strain: Not on file  Food Insecurity: No Food Insecurity (04/12/2024)   Hunger Vital Sign    Worried About Running Out of Food in the Last Year: Never true    Ran Out of Food in the Last Year: Never true  Transportation Needs: No Transportation Needs (04/12/2024)   PRAPARE - Administrator, Civil Service (Medical): No    Lack of Transportation (Non-Medical): No  Physical Activity: Not on file  Stress: Not on file  Social Connections: Not on file  Intimate Partner Violence: Unknown (04/12/2024)   Humiliation, Afraid, Rape, and Kick questionnaire    Fear of Current or Ex-Partner: Patient unable to answer  Emotionally Abused: Patient unable to answer    Physically Abused: Not on file    Sexually Abused: Patient unable to answer    ROS      Objective    BP 119/75   Pulse (!) 103   Ht 5' 4 (1.626 m)   Wt 144 lb 1.9 oz (65.4 kg)   SpO2 93%   BMI 24.74 kg/m   Physical Exam Vitals and nursing note reviewed. Exam conducted with a chaperone present (pt's mom and sister are with him today).  Constitutional:      Appearance: Normal appearance.  HENT:     Head: Normocephalic.     Right Ear: Tympanic membrane, ear canal and external ear normal.     Left Ear: Tympanic membrane, ear canal and external ear normal.     Nose: Nose normal.     Mouth/Throat:     Mouth: Mucous membranes are moist.     Pharynx: Oropharynx is clear.  Eyes:     Extraocular  Movements: Extraocular movements intact.     Pupils: Pupils are equal, round, and reactive to light.  Cardiovascular:     Rate and Rhythm: Normal rate and regular rhythm.  Pulmonary:     Effort: Pulmonary effort is normal.     Breath sounds: Normal breath sounds.  Musculoskeletal:     Cervical back: Normal range of motion and neck supple.  Skin:    General: Skin is warm and dry.  Neurological:     General: No focal deficit present.     Mental Status: He is alert and oriented to person, place, and time.     Motor: No weakness.     Gait: Gait normal.  Psychiatric:        Mood and Affect: Mood normal.        Thought Content: Thought content normal.         Assessment & Plan:   Problem List Items Addressed This Visit       Other   Seizures (HCC) - Primary   Agree to refill anti seizure medication, lacosamide  100 mg, until he can be evaluated by neurology next month.       Relevant Medications   Lacosamide  (VIMPAT ) 100 MG TABS    Return in about 3 months (around 08/27/2024).   Leita Longs, FNP

## 2024-05-27 NOTE — Assessment & Plan Note (Addendum)
 Agree to refill anti seizure medication, lacosamide  100 mg, until he can be evaluated by neurology next month.

## 2024-06-03 NOTE — Therapy (Incomplete)
 OUTPATIENT PHYSICAL THERAPY LOWER EXTREMITY EVALUATION   Patient Name: Timothy [name] MRN: [id number] DOB:07-30-1996, 28 y.o., male Today's Date: 06/03/2024  END OF SESSION:   Past Medical History:  Diagnosis Date   Cannabinoid hyperemesis syndrome    Febrile seizures (HCC)    last at 10 y of age   Withdrawal seizures (HCC)    No past surgical history on file. 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  Rationale for Evaluation and Treatment: {HABREHAB:27488}  ONSET DATE: ***  SUBJECTIVE:   SUBJECTIVE STATEMENT: ***  PERTINENT HISTORY: *** PAIN:  Are you having pain? {OPRCPAIN:27236}  PRECAUTIONS: {Therapy precautions:24002}  RED FLAGS: {PT Red Flags:29287}   WEIGHT BEARING RESTRICTIONS: {Yes ***/No:24003}  FALLS:  Has patient fallen in last 6 months? {fallsyesno:27318}  LIVING ENVIRONMENT: Lives with: {OPRC lives with:25569::lives with their family} Lives in: {Lives in:25570} Stairs: {opstairs:27293} Has following equipment at home: {Assistive devices:23999}  OCCUPATION: ***  PLOF: {PLOF:24004}  PATIENT GOALS: ***  NEXT MD VISIT: ***  OBJECTIVE:  Note: Objective measures were completed at Evaluation unless otherwise noted.  DIAGNOSTIC FINDINGS: ***  PATIENT SURVEYS:  {rehab surveys:24030}  COGNITION: Overall cognitive status: {cognition:24006}     SENSATION: {sensation:27233}  EDEMA:  {edema:24020}  MUSCLE LENGTH: Hamstrings: Right *** deg; Left *** deg Debby test: Right *** deg; Left *** deg  POSTURE: {posture:25561}  PALPATION: ***  LOWER EXTREMITY ROM:  {AROM/PROM:27142} ROM Right eval Left eval  Hip flexion    Hip extension    Hip abduction    Hip  adduction    Hip internal rotation    Hip external rotation    Knee flexion    Knee extension    Ankle dorsiflexion    Ankle plantarflexion    Ankle inversion    Ankle eversion     (Blank rows = not tested)  LOWER EXTREMITY MMT:  MMT Right eval Left eval  Hip flexion    Hip extension    Hip abduction    Hip adduction    Hip internal rotation    Hip external rotation    Knee flexion    Knee extension    Ankle dorsiflexion    Ankle plantarflexion    Ankle inversion    Ankle eversion     (Blank rows = not tested)  LOWER EXTREMITY SPECIAL TESTS:  {LEspecialtests:26242}  FUNCTIONAL TESTS:  {Functional tests:24029}  GAIT: Distance walked: *** Assistive device utilized: {Assistive devices:23999} Level of assistance: {Levels of assistance:24026} Comments: ***  TREATMENT DATE: ***    PATIENT EDUCATION:  Education details: *** Person educated: {Person educated:25204} Education method: {Education Method:25205} Education comprehension: {Education Comprehension:25206}  HOME EXERCISE PROGRAM: ***  ASSESSMENT:  CLINICAL IMPRESSION: Patient is a *** y.o. *** who was seen today for physical therapy evaluation and treatment for ***.   OBJECTIVE IMPAIRMENTS: {opptimpairments:25111}.   ACTIVITY LIMITATIONS: {activitylimitations:27494}  PARTICIPATION LIMITATIONS: {participationrestrictions:25113}  PERSONAL FACTORS: {Personal factors:25162} are also affecting patient's functional outcome.   REHAB POTENTIAL: {rehabpotential:25112}  CLINICAL DECISION MAKING: {clinical decision making:25114}  EVALUATION COMPLEXITY: {Evaluation complexity:25115}   GOALS: Goals reviewed with patient? {yes/no:20286}  SHORT TERM GOALS: Target date: *** *** Baseline: Goal status: INITIAL  2.  *** Baseline:  Goal status: INITIAL  3.  *** Baseline:   Goal status: INITIAL  4.  *** Baseline:  Goal status: INITIAL  5.  *** Baseline:  Goal status: INITIAL  6.  *** Baseline:  Goal status: INITIAL  LONG TERM GOALS: Target date: ***  *** Baseline:  Goal status: INITIAL  2.  *** Baseline:  Goal status: INITIAL  3.  *** Baseline:  Goal status: INITIAL  4.  *** Baseline:  Goal status: INITIAL  5.  *** Baseline:  Goal status: INITIAL  6.  *** Baseline:  Goal status: INITIAL   PLAN:  PT FREQUENCY: {rehab frequency:25116}  PT DURATION: {rehab duration:25117}  PLANNED INTERVENTIONS: {rehab planned interventions:25118::97110-Therapeutic exercises,97530- Therapeutic [phone]- Neuromuscular re-education,97535- Self Rjmz,02859- Manual therapy}  PLAN FOR NEXT SESSION: ***   Greig GORMAN Quivers, PT 06/03/2024, 9:01 AM

## 2024-06-04 ENCOUNTER — Ambulatory Visit (HOSPITAL_COMMUNITY)

## 2024-06-12 ENCOUNTER — Encounter (HOSPITAL_COMMUNITY): Payer: Self-pay | Admitting: Emergency Medicine

## 2024-06-12 ENCOUNTER — Emergency Department (HOSPITAL_COMMUNITY)

## 2024-06-12 ENCOUNTER — Emergency Department (HOSPITAL_COMMUNITY)
Admission: EM | Admit: 2024-06-12 | Discharge: 2024-06-12 | Disposition: A | Discharge status: Home / Self Care | Attending: Emergency Medicine | Admitting: Emergency Medicine

## 2024-06-12 ENCOUNTER — Other Ambulatory Visit: Payer: Self-pay

## 2024-06-12 DIAGNOSIS — F17210 Nicotine dependence, cigarettes, uncomplicated: Secondary | ICD-10-CM | POA: Insufficient documentation

## 2024-06-12 DIAGNOSIS — R112 Nausea with vomiting, unspecified: Secondary | ICD-10-CM | POA: Diagnosis present

## 2024-06-12 DIAGNOSIS — K529 Noninfective gastroenteritis and colitis, unspecified: Secondary | ICD-10-CM | POA: Insufficient documentation

## 2024-06-12 DIAGNOSIS — K920 Hematemesis: Secondary | ICD-10-CM | POA: Diagnosis not present

## 2024-06-12 LAB — CBC
HCT: 45.7 % (ref 39.0–52.0)
Hemoglobin: 15.8 g/dL (ref 13.0–17.0)
MCH: 33.1 pg (ref 26.0–34.0)
MCHC: 34.6 g/dL (ref 30.0–36.0)
MCV: 95.6 fL (ref 80.0–100.0)
Platelets: 341 K/uL (ref 150–400)
RBC: 4.78 MIL/uL (ref 4.22–5.81)
RDW: 13.4 % (ref 11.5–15.5)
WBC: 12.2 K/uL — ABNORMAL HIGH (ref 4.0–10.5)
nRBC: 0 % (ref 0.0–0.2)

## 2024-06-12 LAB — COMPREHENSIVE METABOLIC PANEL WITH GFR
ALT: 19 U/L (ref 0–44)
AST: 29 U/L (ref 15–41)
Albumin: 5 g/dL (ref 3.5–5.0)
Alkaline Phosphatase: 88 U/L (ref 38–126)
Anion gap: 17 — ABNORMAL HIGH (ref 5–15)
BUN: 6 mg/dL (ref 6–20)
CO2: 22 mmol/L (ref 22–32)
Calcium: 9.6 mg/dL (ref 8.9–10.3)
Chloride: 100 mmol/L (ref 98–111)
Creatinine, Ser: 0.72 mg/dL (ref 0.61–1.24)
GFR, Estimated: >60 mL/min (ref >60)
Glucose, Bld: 117 mg/dL — ABNORMAL HIGH (ref 70–99)
Potassium: 3.3 mmol/L — ABNORMAL LOW (ref 3.5–5.1)
Sodium: 139 mmol/L (ref 135–145)
Total Bilirubin: 1.5 mg/dL — ABNORMAL HIGH (ref 0.0–1.2)
Total Protein: 8.4 g/dL — ABNORMAL HIGH (ref 6.5–8.1)

## 2024-06-12 LAB — LIPASE, BLOOD: Lipase: 38 U/L (ref 11–51)

## 2024-06-12 MED ORDER — HALOPERIDOL LACTATE 5 MG/ML IJ SOLN
2.0000 mg | Freq: Once | INTRAMUSCULAR | Status: AC
  Administered 2024-06-12: 2 mg via INTRAVENOUS
  Filled 2024-06-12: qty 1

## 2024-06-12 MED ORDER — ONDANSETRON HCL 4 MG/2ML IJ SOLN
4.0000 mg | Freq: Once | INTRAMUSCULAR | Status: AC
  Administered 2024-06-12: 4 mg via INTRAVENOUS
  Filled 2024-06-12: qty 2

## 2024-06-12 MED ORDER — PROMETHAZINE HCL 25 MG PO TABS: 25.0000 mg | ORAL_TABLET | Freq: Four times a day (QID) | ORAL | 0 refills | Status: AC | PRN

## 2024-06-12 MED ORDER — IOHEXOL 300 MG/ML  SOLN
100.0000 mL | Freq: Once | INTRAMUSCULAR | Status: AC | PRN
  Administered 2024-06-12: 100 mL via INTRAVENOUS

## 2024-06-12 MED ORDER — SODIUM CHLORIDE 0.9 % IV BOLUS
1000.0000 mL | Freq: Once | INTRAVENOUS | Status: AC
  Administered 2024-06-12: 1000 mL via INTRAVENOUS

## 2024-06-12 MED ORDER — CIPROFLOXACIN HCL 500 MG PO TABS: 500.0000 mg | ORAL_TABLET | Freq: Two times a day (BID) | ORAL | 0 refills | Status: AC

## 2024-06-12 MED ORDER — PROCHLORPERAZINE EDISYLATE 10 MG/2ML IJ SOLN
10.0000 mg | Freq: Once | INTRAMUSCULAR | Status: AC
  Administered 2024-06-12: 10 mg via INTRAVENOUS
  Filled 2024-06-12: qty 2

## 2024-06-12 MED ORDER — DIPHENHYDRAMINE HCL 50 MG/ML IJ SOLN
25.0000 mg | Freq: Once | INTRAMUSCULAR | Status: AC
  Administered 2024-06-12: 25 mg via INTRAVENOUS
  Filled 2024-06-12: qty 1

## 2024-06-12 MED ORDER — METRONIDAZOLE 500 MG PO TABS: 500.0000 mg | ORAL_TABLET | Freq: Three times a day (TID) | ORAL | 0 refills | Status: AC

## 2024-06-12 NOTE — ED Notes (Signed)
 Denies any emesis but says his nausea is coming back

## 2024-06-12 NOTE — Discharge Instructions (Signed)
 You were evaluated in the Emergency Department and after careful evaluation, we did not find any emergent condition requiring admission or further testing in the hospital.  Your exam/testing today is overall reassuring.  Symptoms likely due to a colitis.  Possibly due to a bacteria.  Take the ciprofloxacin  and metronidazole  antibiotics as directed.  Use the Phenergan  medication as needed for nausea.  Plenty of fluids and rest.  Please return to the Emergency Department if you experience any worsening of your condition.   Thank you for allowing us  to be a part of your care.

## 2024-06-12 NOTE — ED Triage Notes (Addendum)
 Pt with c/o emesis x 2 hrs. Reports approximately 5-6 episodes of vomiting. States last Marijuana use was today. Pt reports that last 2 episodes were dark blood.

## 2024-06-12 NOTE — ED Provider Notes (Signed)
 AP-EMERGENCY DEPT [facility] Emergency Department Provider Note MRN:  [id number]  Arrival date & time: 06/12/24     Chief Complaint   Emesis   History of Present Illness   Timothy [name] is a 28 y.o. year-old male with a history of cannabinoid hyperemesis syndrome presenting to the ED with chief complaint of emesis.  Persistent nausea vomiting for the past 2 hours.  Thinks may be some blood is starting to come up in the vomit.  Having some upper abdominal discomfort as well.  No fever, no diarrhea.  Review of Systems  A thorough review of systems was obtained and all systems are negative except as noted in the HPI and PMH.   Patient's Health History    Past Medical History:  Diagnosis Date   Cannabinoid hyperemesis syndrome    Febrile seizures (HCC)    last at 75 y of age   Withdrawal seizures [facility])     History reviewed. No pertinent surgical history.  Family History  Problem Relation Age of Onset   Diabetes Maternal Grandmother     Social History   Socioeconomic History   Marital status: Single    Spouse name: Not on file   Number of children: Not on file   Years of education: Not on file   Highest education level: Not on file  Occupational History   Not on file  Tobacco Use   Smoking status: Heavy Smoker    Current packs/day: 0.50    Types: Cigarettes   Smokeless tobacco: Never  Vaping Use   Vaping status: Never Used  Substance and Sexual Activity   Alcohol use: Yes    Alcohol/week: 84.0 standard drinks of alcohol    Types: 84 Cans of beer per week    Comment: 2-3 beers daily   Drug use: Yes    Types: Marijuana    Comment: smokes every day   Sexual activity: Yes    Birth control/protection: Condom  Other Topics Concern   Not on file  Social History Narrative   Not on file   Social Drivers of Health   Financial Resource Strain: Not on file  Food Insecurity: No Food Insecurity (04/12/2024)   Hunger Vital Sign    Worried About Running Out of  Food in the Last Year: Never true    Ran Out of Food in the Last Year: Never true  Transportation Needs: No Transportation Needs (04/12/2024)   PRAPARE - Administrator, Civil Service (Medical): No    Lack of Transportation (Non-Medical): No  Physical Activity: Not on file  Stress: Not on file  Social Connections: Not on file  Intimate Partner Violence: Unknown (04/12/2024)   Humiliation, Afraid, Rape, and Kick questionnaire    Fear of Current or Ex-Partner: Patient unable to answer    Emotionally Abused: Patient unable to answer    Physically Abused: Not on file    Sexually Abused: Patient unable to answer     Physical Exam   Vitals:   06/12/24 0024  BP: (!) 143/111  Pulse: 79  Resp: 18  Temp: 98.1 F (36.7 C)  SpO2: 98%    CONSTITUTIONAL: Well-appearing, NAD NEURO/PSYCH:  Alert and oriented x 3, no focal deficits EYES:  eyes equal and reactive ENT/NECK:  no LAD, no JVD CARDIO: Regular rate, well-perfused, normal S1 and S2 PULM:  CTAB no wheezing or rhonchi GI/GU:  non-distended, non-tender MSK/SPINE:  No gross deformities, no edema SKIN:  no rash, atraumatic   *Additional  and/or pertinent findings included in MDM below  Diagnostic and Interventional Summary    EKG Interpretation Date/Time:    Ventricular Rate:    PR Interval:    QRS Duration:    QT Interval:    QTC Calculation:   R Axis:      Text Interpretation:         Labs Reviewed  COMPREHENSIVE METABOLIC PANEL WITH GFR - Abnormal; Notable for the following components:      Result Value   Potassium 3.3 (*)    Glucose, Bld 117 (*)    Total Protein 8.4 (*)    Total Bilirubin 1.5 (*)    Anion gap 17 (*)    All other components within normal limits  CBC - Abnormal; Notable for the following components:   WBC 12.2 (*)    All other components within normal limits  LIPASE, BLOOD    CT ABDOMEN PELVIS W CONTRAST  Final Result    DG Chest Port 1 View  Final Result      Medications   sodium chloride  0.9 % bolus 1,000 mL (0 mLs Intravenous Stopped 06/12/24 0331)  haloperidol  lactate (HALDOL ) injection 2 mg (2 mg Intravenous Given 06/12/24 0057)  diphenhydrAMINE  (BENADRYL ) injection 25 mg (25 mg Intravenous Given 06/12/24 0332)  prochlorperazine  (COMPAZINE ) injection 10 mg (10 mg Intravenous Given 06/12/24 0332)  iohexol  (OMNIPAQUE ) 300 MG/ML solution 100 mL (100 mLs Intravenous Contrast Given 06/12/24 0306)     Procedures  /  Critical Care Procedures  ED Course and Medical Decision Making  Initial Impression and Ddx Abdomen soft nontender but patient looks quite uncomfortable, leaning over on the bed, cannot stay still.  Suspect recurrence of marijuana hyperemesis.  Past medical/surgical history that increases complexity of ED encounter: Marijuana hyperemesis  Interpretation of Diagnostics I personally reviewed the Laboratory Testing and my interpretation is as follows: No significant blood count or electrolyte disturbance.  CT reveals colitis  Patient Reassessment and Ultimate Disposition/Management     Patient doing better, tolerating p.o.  Appropriate for discharge.  Patient management required discussion with the following services or consulting groups:  None  Complexity of Problems Addressed Acute illness or injury that poses threat of life of bodily function  Additional Data Reviewed and Analyzed Further history obtained from: Further history from spouse/family member  Additional Factors Impacting ED Encounter Risk Prescriptions, Use of parenteral controlled substances, and Consideration of hospitalization  Ozell HERO. Theadore, MD [facility] Emergency Medicine [facility] [email or web address]  Final Clinical Impressions(s) / ED Diagnoses     ICD-10-CM   1. Colitis  K52.9       ED Discharge Orders          Ordered    promethazine  (PHENERGAN ) 25 MG tablet  Every 6 hours PRN        06/12/24 0538    ciprofloxacin  (CIPRO ) 500 MG  tablet  Every 12 hours        06/12/24 0538    metroNIDAZOLE  (FLAGYL ) 500 MG tablet  3 times daily        06/12/24 9461             Discharge Instructions Discussed with and Provided to Patient:     Discharge Instructions      You were evaluated in the Emergency Department and after careful evaluation, we did not find any emergent condition requiring admission or further testing in the hospital.  Your exam/testing today is overall reassuring.  Symptoms likely due to a colitis.  Possibly due to a bacteria.  Take the ciprofloxacin  and metronidazole  antibiotics as directed.  Use the Phenergan  medication as needed for nausea.  Plenty of fluids and rest.  Please return to the Emergency Department if you experience any worsening of your condition.   Thank you for allowing us  to be a part of your care.       Theadore Ozell HERO, MD 06/12/24 [phone]

## 2024-06-23 ENCOUNTER — Encounter: Payer: Self-pay | Admitting: Neurology

## 2024-06-23 ENCOUNTER — Ambulatory Visit (INDEPENDENT_AMBULATORY_CARE_PROVIDER_SITE_OTHER): Admitting: Neurology

## 2024-06-23 ENCOUNTER — Telehealth: Payer: Self-pay | Admitting: Neurology

## 2024-06-23 VITALS — BP 126/72 | Ht 64.0 in | Wt 149.0 lb

## 2024-06-23 DIAGNOSIS — G40309 Generalized idiopathic epilepsy and epileptic syndromes, not intractable, without status epilepticus: Secondary | ICD-10-CM | POA: Diagnosis not present

## 2024-06-23 DIAGNOSIS — R569 Unspecified convulsions: Secondary | ICD-10-CM

## 2024-06-23 MED ORDER — LACOSAMIDE 100 MG PO TABS
100.0000 mg | ORAL_TABLET | Freq: Two times a day (BID) | ORAL | 5 refills | Status: DC
Start: 2024-06-23 — End: 2024-10-18

## 2024-06-23 MED ORDER — VALTOCO 20 MG DOSE 2 X 10 MG/0.1ML NA LQPK: 20.0000 mg | NASAL | 0 refills | Status: DC | PRN

## 2024-06-23 NOTE — Progress Notes (Signed)
 [facility]  PATIENT: Timothy [name] DOB: 02-11-1996  REQUESTING CLINICIAN: Arlinda Ranks, MD HISTORY FROM: Patient/Family  REASON FOR VISIT: Seizure    HISTORICAL  CHIEF COMPLAINT:  Chief Complaint  Patient presents with   New Patient (Initial Visit)    Rm 13, with mom and sister, last seizure 7/4, non compliant with medication,     HISTORY OF PRESENT ILLNESS:  This is a 28 year old gentleman past medical history of alcoholism, marijuana use, seizure disorder who is presenting to establish care.  In brief, he tells me his seizures started since the age of 6 months after having a febrile seizure.  He went on to have seizures sporadically, was never put on medication. In 2020, he had generalized convulsion, and since then his seizure frequency has increased.  He reports generalized convulsion and also staring spells.  But lately he has been only having generalized convulsion.  He did have 2 seizures this year, one on May 24 and the last one on July 4.  He was put on levetiracetam  after the seizure on May 24 but stated that medication gave him hallucinations, therefore he self discontinued.  After his seizure on July 4.  He was put on lacosamide  but he has been taking the medication inconsistently.  Fortunately for patient has not had any seizure or seizure activity.  However he still consume alcohol and marijuana.  Handedness: Right handed   Onset: 6 months, febrile seizure   Seizure Type: Staring spells ,then has generalized convulsion   Current frequency: 2 seizure in 2025, last one July 4  Any injuries from seizures: Tongue biting, back pain   Seizure risk factors: Father   Previous ASMs: Levetiracetam ,   Currenty ASMs: Lacosamide  100 mg BID   ASMs side effects: Hallucination with Levetiracetam , none with Lacosamide   Brain Images: Subdural hematoma   Previous EEGs: diffuse slowing    OTHER MEDICAL CONDITIONS: Alcohol and marijuana use   REVIEW  OF SYSTEMS: Full 14 system review of systems performed and negative with exception of: As noted in the HPI   ALLERGIES: Allergies  Allergen Reactions   Keppra  [Levetiracetam ] Other (See Comments)    hallucinations   Amoxil [Amoxicillin] Rash    HOME MEDICATIONS: Outpatient Medications Prior to Visit  Medication Sig Dispense Refill   promethazine  (PHENERGAN ) 25 MG tablet Take 1 tablet (25 mg total) by mouth every 6 (six) hours as needed for nausea or vomiting. 30 tablet 0   Lacosamide  (VIMPAT ) 100 MG TABS Take 1 tablet (100 mg total) by mouth in the morning and at bedtime. 60 tablet 0   No facility-administered medications prior to visit.    PAST MEDICAL HISTORY: Past Medical History:  Diagnosis Date   Cannabinoid hyperemesis syndrome    Febrile seizures (HCC)    last at 61 y of age   Withdrawal seizures (HCC)     PAST SURGICAL HISTORY: History reviewed. No pertinent surgical history.  FAMILY HISTORY: Family History  Problem Relation Age of Onset   Diabetes Maternal Grandmother     SOCIAL HISTORY: Social History   Socioeconomic History   Marital status: Single    Spouse name: Not on file   Number of children: Not on file   Years of education: Not on file   Highest education level: Not on file  Occupational History   Not on file  Tobacco Use   Smoking status: Heavy Smoker    Current packs/day: 0.50    Types: Cigarettes   Smokeless tobacco:  Never  Vaping Use   Vaping status: Never Used  Substance and Sexual Activity   Alcohol use: Not Currently    Comment: 2-3 beers daily   Drug use: Yes    Types: Marijuana    Comment: smokes every day   Sexual activity: Yes    Birth control/protection: Condom  Other Topics Concern   Not on file  Social History Narrative   Right handed   Caffeine- occasionally   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   Social Drivers of Health   Financial Resource Strain: Not on file  Food Insecurity: No Food Insecurity (04/12/2024)    Hunger Vital Sign    Worried About Running Out of Food in the Last Year: Never true    Ran Out of Food in the Last Year: Never true  Transportation Needs: No Transportation Needs (04/12/2024)   PRAPARE - Administrator, Civil Service (Medical): No    Lack of Transportation (Non-Medical): No  Physical Activity: Not on file  Stress: Not on file  Social Connections: Not on file  Intimate Partner Violence: Unknown (04/12/2024)   Humiliation, Afraid, Rape, and Kick questionnaire    Fear of Current or Ex-Partner: Patient unable to answer    Emotionally Abused: Patient unable to answer    Physically Abused: Not on file    Sexually Abused: Patient unable to answer    PHYSICAL EXAM  GENERAL EXAM/CONSTITUTIONAL: Vitals:  Vitals:   06/23/24 1315  BP: 126/72  Weight: 149 lb (67.6 kg)  Height: 5' 4 (1.626 m)   Body mass index is 25.58 kg/m. Wt Readings from Last 3 Encounters:  06/23/24 149 lb (67.6 kg)  06/12/24 143 lb 4.8 oz (65 kg)  05/27/24 144 lb 1.9 oz (65.4 kg)   Patient is in no distress; well developed, nourished and groomed; neck is supple  MUSCULOSKELETAL: Gait, strength, tone, movements noted in Neurologic exam below  NEUROLOGIC: MENTAL STATUS:      No data to display         awake, alert, oriented to person, place and time recent and remote memory intact normal attention and concentration language fluent, comprehension intact, naming intact fund of knowledge appropriate  CRANIAL NERVE:  2nd, 3rd, 4th, 6th - Visual fields full to confrontation, extraocular muscles intact, no nystagmus 5th - facial sensation symmetric 7th - facial strength symmetric 8th - hearing intact 9th - palate elevates symmetrically, uvula midline 11th - shoulder shrug symmetric 12th - tongue protrusion midline  MOTOR:  normal bulk and tone, full strength in the BUE, BLE  SENSORY:  normal and symmetric to light touch  COORDINATION:  finger-nose-finger, fine finger  movements normal  GAIT/STATION:  normal     DIAGNOSTIC DATA (LABS, IMAGING, TESTING) - I reviewed patient records, labs, notes, testing and imaging myself where available.  Lab Results  Component Value Date   WBC 12.2 (H) 06/12/2024   HGB 15.8 06/12/2024   HCT 45.7 06/12/2024   MCV 95.6 06/12/2024   PLT 341 06/12/2024      Component Value Date/Time   NA 139 06/12/2024 0024   K 3.3 (L) 06/12/2024 0024   CL 100 06/12/2024 0024   CO2 22 06/12/2024 0024   GLUCOSE 117 (H) 06/12/2024 0024   BUN 6 06/12/2024 0024   CREATININE 0.72 06/12/2024 0024   CALCIUM 9.6 06/12/2024 0024   PROT 8.4 (H) 06/12/2024 0024   ALBUMIN 5.0 06/12/2024 0024   AST 29 06/12/2024 0024   ALT 19 06/12/2024 0024  ALKPHOS 88 06/12/2024 0024   BILITOT 1.5 (H) 06/12/2024 0024   GFRNONAA >60 06/12/2024 0024   GFRAA >60 07/29/2019 1737   No results found for: CHOL, HDL, LDLCALC, LDLDIRECT, TRIG Lab Results  Component Value Date   HGBA1C 4.6 (L) 04/12/2024   No results found for: VITAMINB12 No results found for: TSH   MRI Brain 04/12/2024 1. Thin subdural hematomas over the parietal and occipital lobes bilaterally without significant mass effect. Anterior hemorrhage is no longer visualized. 2. Hemorrhagic contusions along the gyrus rectus bilaterally, right greater than left. 3. Mild diffuse dural enhancement, likely reactive to the subdural and subarachnoid blood. No pathologic parenchymal enhancement.  EEG 04/13/2024 - Continuous slow, generalized    I personally reviewed brain Images and previous EEG reports.   ASSESSMENT AND PLAN  28 y.o. year old male  with history of epilepsy, alcohol and marijuana use who is presenting to establish care for his epilepsy.  He was started on lacosamide  but does have history of medication noncompliance.  We discussed importance of medication adherence, advised him to continue with lacosamide  100 mg twice daily since he does not have any side  effect from the medication.  Will obtain updated EEG.  Will also give him Valtoco  as rescue medication.  Patient understands to contact me if he does have any breakthrough seizure otherwise I will see him in 6 to 8 months for follow-up or sooner if worse.   1. Nonintractable generalized idiopathic epilepsy without status epilepticus (HCC)   2. Seizures (HCC)     Patient Instructions  Continue with Vimpat  100 mg twice daily, refill given Will obtain routine EEG Valtoco  as rescue medication Discussed importance of medication compliance, he voiced understanding Follow-up in 6 to 8 months or sooner if worse. Also discussed driving restriction for total of 6 months.   Per [place] statutes, patients with seizures are not allowed to drive until they have been seizure-free for six months.  Other recommendations include using caution when using heavy equipment or power tools. Avoid working on ladders or at heights. Take showers instead of baths.  Do not swim alone.  Ensure the water temperature is not too high on the home water heater. Do not go swimming alone. Do not lock yourself in a room alone (i.e. bathroom). When caring for infants or small children, sit down when holding, feeding, or changing them to minimize risk of injury to the child in the event you have a seizure. Maintain good sleep hygiene. Avoid alcohol.  Also recommend adequate sleep, hydration, good diet and minimize stress.   During the Seizure  - First, ensure adequate ventilation and place patients on the floor on their left side  Loosen clothing around the neck and ensure the airway is patent. If the patient is clenching the teeth, do not force the mouth open with any object as this can cause severe damage - Remove all items from the surrounding that can be hazardous. The patient may be oblivious to what's happening and may not even know what he or she is doing. If the patient is confused and wandering, either gently  guide him/her away and block access to outside areas - Reassure the individual and be comforting - Call 911. In most cases, the seizure ends before EMS arrives. However, there are cases when seizures may last over 3 to 5 minutes. Or the individual may have developed breathing difficulties or severe injuries. If a pregnant patient or a person with diabetes develops a  seizure, it is prudent to call an ambulance. - Finally, if the patient does not regain full consciousness, then call EMS. Most patients will remain confused for about 45 to 90 minutes after a seizure, so you must use judgment in calling for help. - Avoid restraints but make sure the patient is in a bed with padded side rails - Place the individual in a lateral position with the neck slightly flexed; this will help the saliva drain from the mouth and prevent the tongue from falling backward - Remove all nearby furniture and other hazards from the area - Provide verbal assurance as the individual is regaining consciousness - Provide the patient with privacy if possible - Call for help and start treatment as ordered by the caregiver   After the Seizure (Postictal Stage)  After a seizure, most patients experience confusion, fatigue, muscle pain and/or a headache. Thus, one should permit the individual to sleep. For the next few days, reassurance is essential. Being calm and helping reorient the person is also of importance.  Most seizures are painless and end spontaneously. Seizures are not harmful to others but can lead to complications such as stress on the lungs, brain and the heart. Individuals with prior lung problems may develop labored breathing and respiratory distress.    Discussed Patients with epilepsy have a small risk of sudden unexpected death, a condition referred to as sudden unexpected death in epilepsy (SUDEP). SUDEP is defined specifically as the sudden, unexpected, witnessed or unwitnessed, nontraumatic and nondrowning  death in patients with epilepsy with or without evidence for a seizure, and excluding documented status epilepticus, in which post mortem examination does not reveal a structural or toxicologic cause for death     Orders Placed This Encounter  Procedures   EEG adult    Meds ordered this encounter  Medications   Lacosamide  (VIMPAT ) 100 MG TABS    Sig: Take 1 tablet (100 mg total) by mouth in the morning and at bedtime.    Dispense:  60 tablet    Refill:  5   diazePAM , 20 MG Dose, (VALTOCO  20 MG DOSE) 2 x 10 MG/0.1ML LQPK    Sig: Place 20 mg into the nose as needed (for seizure).    Dispense:  10 each    Refill:  0    Please provide a total of 10 doses    Return in about 6 months (around 12/24/2024).    Pastor Falling, MD 06/23/2024, 1:54 PM  [facility] [address] [place], [place] [address] [phone]

## 2024-06-23 NOTE — Patient Instructions (Addendum)
 Continue with Vimpat  100 mg twice daily, refill given Will obtain routine EEG Valtoco  as rescue medication Discussed importance of medication compliance, he voiced understanding Follow-up in 6 to 8 months or sooner if worse. Also discussed driving restriction for total of 6 months.

## 2024-06-23 NOTE — Telephone Encounter (Signed)
 Appointment details confirmed

## 2024-06-24 ENCOUNTER — Other Ambulatory Visit: Payer: Self-pay

## 2024-06-24 ENCOUNTER — Encounter (HOSPITAL_COMMUNITY): Payer: Self-pay

## 2024-06-24 ENCOUNTER — Emergency Department (HOSPITAL_COMMUNITY)
Admission: EM | Admit: 2024-06-24 | Discharge: 2024-06-24 | Disposition: A | Discharge status: Home / Self Care | Attending: Emergency Medicine | Admitting: Emergency Medicine

## 2024-06-24 DIAGNOSIS — R112 Nausea with vomiting, unspecified: Secondary | ICD-10-CM | POA: Diagnosis not present

## 2024-06-24 DIAGNOSIS — R1110 Vomiting, unspecified: Secondary | ICD-10-CM | POA: Diagnosis present

## 2024-06-24 LAB — CBC WITH DIFFERENTIAL/PLATELET
Abs Immature Granulocytes: 0.04 K/uL (ref 0.00–0.07)
Basophils Absolute: 0.1 K/uL (ref 0.0–0.1)
Basophils Relative: 0 %
Eosinophils Absolute: 0 K/uL (ref 0.0–0.5)
Eosinophils Relative: 0 %
HCT: 46.8 % (ref 39.0–52.0)
Hemoglobin: 16.1 g/dL (ref 13.0–17.0)
Immature Granulocytes: 0 %
Lymphocytes Relative: 6 %
Lymphs Abs: 0.9 K/uL (ref 0.7–4.0)
MCH: 32.7 pg (ref 26.0–34.0)
MCHC: 34.4 g/dL (ref 30.0–36.0)
MCV: 95.1 fL (ref 80.0–100.0)
Monocytes Absolute: 0.7 K/uL (ref 0.1–1.0)
Monocytes Relative: 4 %
Neutro Abs: 13 K/uL — ABNORMAL HIGH (ref 1.7–7.7)
Neutrophils Relative %: 90 %
Platelets: 296 K/uL (ref 150–400)
RBC: 4.92 MIL/uL (ref 4.22–5.81)
RDW: 13.4 % (ref 11.5–15.5)
WBC: 14.7 K/uL — ABNORMAL HIGH (ref 4.0–10.5)
nRBC: 0 % (ref 0.0–0.2)

## 2024-06-24 LAB — COMPREHENSIVE METABOLIC PANEL WITH GFR
ALT: 23 U/L (ref 0–44)
AST: 36 U/L (ref 15–41)
Albumin: 5.5 g/dL — ABNORMAL HIGH (ref 3.5–5.0)
Alkaline Phosphatase: 102 U/L (ref 38–126)
Anion gap: 21 — ABNORMAL HIGH (ref 5–15)
BUN: 7 mg/dL (ref 6–20)
CO2: 21 mmol/L — ABNORMAL LOW (ref 22–32)
Calcium: 10.2 mg/dL (ref 8.9–10.3)
Chloride: 98 mmol/L (ref 98–111)
Creatinine, Ser: 0.66 mg/dL (ref 0.61–1.24)
GFR, Estimated: >60 mL/min (ref >60)
Glucose, Bld: 87 mg/dL (ref 70–99)
Potassium: 3.7 mmol/L (ref 3.5–5.1)
Sodium: 140 mmol/L (ref 135–145)
Total Bilirubin: 2 mg/dL — ABNORMAL HIGH (ref 0.0–1.2)
Total Protein: 9.2 g/dL — ABNORMAL HIGH (ref 6.5–8.1)

## 2024-06-24 LAB — LIPASE, BLOOD: Lipase: 23 U/L (ref 11–51)

## 2024-06-24 MED ORDER — CHLORDIAZEPOXIDE HCL 25 MG PO CAPS
25.0000 mg | ORAL_CAPSULE | Freq: Once | ORAL | Status: AC
  Administered 2024-06-24: 25 mg via ORAL
  Filled 2024-06-24: qty 1

## 2024-06-24 MED ORDER — ONDANSETRON 4 MG PO TBDP: ORAL_TABLET | ORAL | 0 refills | Status: DC

## 2024-06-24 MED ORDER — ONDANSETRON HCL 4 MG/2ML IJ SOLN
4.0000 mg | Freq: Once | INTRAMUSCULAR | Status: AC
  Administered 2024-06-24: 4 mg via INTRAVENOUS
  Filled 2024-06-24: qty 2

## 2024-06-24 MED ORDER — LORAZEPAM 1 MG PO TABS
1.0000 mg | ORAL_TABLET | Freq: Once | ORAL | Status: AC
  Administered 2024-06-24: 1 mg via ORAL
  Filled 2024-06-24: qty 1

## 2024-06-24 MED ORDER — PANTOPRAZOLE SODIUM 40 MG IV SOLR
40.0000 mg | Freq: Once | INTRAVENOUS | Status: AC
  Administered 2024-06-24: 40 mg via INTRAVENOUS
  Filled 2024-06-24: qty 10

## 2024-06-24 MED ORDER — CHLORDIAZEPOXIDE HCL 25 MG PO CAPS: ORAL_CAPSULE | ORAL | 0 refills | Status: AC

## 2024-06-24 MED ORDER — PANTOPRAZOLE SODIUM 20 MG PO TBEC: 20.0000 mg | DELAYED_RELEASE_TABLET | Freq: Every day | ORAL | 0 refills | Status: AC

## 2024-06-24 MED ORDER — DIPHENHYDRAMINE HCL 50 MG/ML IJ SOLN
25.0000 mg | Freq: Once | INTRAMUSCULAR | Status: AC
  Administered 2024-06-24: 25 mg via INTRAVENOUS
  Filled 2024-06-24: qty 1

## 2024-06-24 MED ORDER — SODIUM CHLORIDE 0.9 % IV BOLUS
1000.0000 mL | Freq: Once | INTRAVENOUS | Status: AC
  Administered 2024-06-24: 1000 mL via INTRAVENOUS

## 2024-06-24 NOTE — ED Triage Notes (Addendum)
 Patient reports vomiting since 8am..  Reports can't keep anything down.  Patient does admit to alcohol use everyday.  Mild abd pain denies diarrhea. Denies blood in vomit. Admits to [facility] use last night and this is what happens after smoking.

## 2024-06-24 NOTE — ED Provider Notes (Signed)
 [place] EMERGENCY DEPARTMENT AT [facility] Provider Note   CSN: [id number] Arrival date & time: 06/24/24  1249     Patient presents with: Emesis   Timothy [name] is a 28 y.o. male.  {Add pertinent medical, surgical, social history, OB history to YEP:67052} Patient has a history of alcohol abuse and hyperemesis from marijuana use.  Patient comes in today complaining of vomiting.  He is still using marijuana and alcohol.  Patient has no discomfort  The history is provided by the patient and medical records. No language interpreter was used.  Emesis Severity:  Moderate Timing:  Constant Quality:  Bilious material Able to tolerate:  Liquids Progression:  Unchanged Chronicity:  New Recent urination:  Normal Context: not post-tussive   Relieved by:  Nothing Worsened by:  Nothing Associated symptoms: no abdominal pain, no cough, no diarrhea and no headaches        Prior to Admission medications   Medication Sig Start Date End Date Taking? Authorizing Provider  diazePAM , 20 MG Dose, (VALTOCO  20 MG DOSE) 2 x 10 MG/0.1ML LQPK Place 20 mg into the nose as needed (for seizure). 06/23/24   Camara, Amadou, MD  Lacosamide  (VIMPAT ) 100 MG TABS Take 1 tablet (100 mg total) by mouth in the morning and at bedtime. 06/23/24   Camara, Amadou, MD  promethazine  (PHENERGAN ) 25 MG tablet Take 1 tablet (25 mg total) by mouth every 6 (six) hours as needed for nausea or vomiting. 06/12/24   Theadore Ozell HERO, MD    Allergies: Keppra  [levetiracetam ] and Amoxil [amoxicillin]    Review of Systems  Constitutional:  Negative for appetite change and fatigue.  HENT:  Negative for congestion, ear discharge and sinus pressure.   Eyes:  Negative for discharge.  Respiratory:  Negative for cough.   Cardiovascular:  Negative for chest pain.  Gastrointestinal:  Positive for vomiting. Negative for abdominal pain and diarrhea.  Genitourinary:  Negative for frequency and hematuria.  Musculoskeletal:   Negative for back pain.  Skin:  Negative for rash.  Neurological:  Negative for seizures and headaches.  Psychiatric/Behavioral:  Negative for hallucinations.     Updated Vital Signs BP (!) 150/97 (BP Location: Right Arm)   Pulse (!) 108   Temp 97.7 F (36.5 C) (Oral)   Resp 18   Ht 5' 4 (1.626 m)   Wt 67.6 kg   SpO2 100%   BMI 25.58 kg/m   Physical Exam Vitals and nursing note reviewed.  Constitutional:      Appearance: He is well-developed.  HENT:     Head: Normocephalic.     Nose: Nose normal.  Eyes:     General: No scleral icterus.    Conjunctiva/sclera: Conjunctivae normal.  Neck:     Thyroid: No thyromegaly.  Cardiovascular:     Rate and Rhythm: Normal rate and regular rhythm.     Heart sounds: No murmur heard.    No friction rub. No gallop.  Pulmonary:     Breath sounds: No stridor. No wheezing or rales.  Chest:     Chest wall: No tenderness.  Abdominal:     General: There is no distension.     Tenderness: There is no abdominal tenderness. There is no rebound.  Musculoskeletal:        General: Normal range of motion.     Cervical back: Neck supple.  Lymphadenopathy:     Cervical: No cervical adenopathy.  Skin:    Findings: No erythema or rash.  Neurological:  Mental Status: He is alert and oriented to person, place, and time.     Motor: No abnormal muscle tone.     Coordination: Coordination normal.  Psychiatric:        Behavior: Behavior normal.     (all labs ordered are listed, but only abnormal results are displayed) Labs Reviewed  COMPREHENSIVE METABOLIC PANEL WITH GFR - Abnormal; Notable for the following components:      Result Value   CO2 21 (*)    Total Protein 9.2 (*)    Albumin 5.5 (*)    Total Bilirubin 2.0 (*)    Anion gap 21 (*)    All other components within normal limits  CBC WITH DIFFERENTIAL/PLATELET - Abnormal; Notable for the following components:   WBC 14.7 (*)    Neutro Abs 13.0 (*)    All other components within  normal limits  LIPASE, BLOOD    EKG: None  Radiology: No results found.  {Document cardiac monitor, telemetry assessment procedure when appropriate:32947} Procedures   Medications Ordered in the ED  sodium chloride  0.9 % bolus 1,000 mL (has no administration in time range)  ondansetron  (ZOFRAN ) injection 4 mg (has no administration in time range)  pantoprazole  (PROTONIX ) injection 40 mg (has no administration in time range)      {Click here for ABCD2, HEART and other calculators REFRESH Note before signing:1}                              Medical Decision Making Amount and/or Complexity of Data Reviewed Labs: ordered.  Risk Prescription drug management.   Vomiting probably secondary to gastritis or hyperemesis from marijuana.  He will be discharged home with Protonix  and Zofran  and follow-up with his PCP  {Document critical care time when appropriate  Document review of labs and clinical decision tools ie CHADS2VASC2, etc  Document your independent review of radiology images and any outside records  Document your discussion with family members, caretakers and with consultants  Document social determinants of health affecting pt's care  Document your decision making why or why not admission, treatments were needed:32947:::1}   Final diagnoses:  None    ED Discharge Orders     None

## 2024-06-24 NOTE — Discharge Instructions (Addendum)
 A prescription for medication called chlordiazepoxide  was sent to your pharmacy.  Take this in tapering dose as prescribed to minimize symptoms of alcohol withdrawal.  If you do end up drinking alcohol again, please stop using this medication.  Take ondansetron  as needed for nausea.  Take Protonix  daily.  These prescriptions were sent to your pharmacy as well.  Drink plenty fluids to stay hydrated.  Return to the emergency department for any new or worsening symptoms of concern.

## 2024-06-24 NOTE — ED Provider Notes (Signed)
 Care of patient assumed from Dr. Zammit.  Patient presenting for nausea and vomiting.  History of alcohol and marijuana use.  Will require p.o. challenge. Physical Exam  BP 138/86 (BP Location: Right Arm)   Pulse (!) 102   Temp 97.9 F (36.6 C) (Oral)   Resp 20   Ht 5' 4 (1.626 m)   Wt 67.6 kg   SpO2 100%   BMI 25.58 kg/m   Physical Exam Vitals and nursing note reviewed.  Constitutional:      General: He is not in acute distress.    Appearance: Normal appearance. He is well-developed. He is not ill-appearing, toxic-appearing or diaphoretic.  HENT:     Head: Normocephalic and atraumatic.     Right Ear: External ear normal.     Left Ear: External ear normal.     Nose: Nose normal.     Mouth/Throat:     Mouth: Mucous membranes are moist.  Eyes:     Extraocular Movements: Extraocular movements intact.     Conjunctiva/sclera: Conjunctivae normal.  Cardiovascular:     Rate and Rhythm: Normal rate and regular rhythm.  Pulmonary:     Effort: Pulmonary effort is normal. No respiratory distress.  Abdominal:     General: There is no distension.     Palpations: Abdomen is soft.  Musculoskeletal:        General: No swelling. Normal range of motion.     Cervical back: Normal range of motion and neck supple.  Skin:    General: Skin is warm and dry.  Neurological:     General: No focal deficit present.     Mental Status: He is alert and oriented to person, place, and time.  Psychiatric:        Mood and Affect: Mood normal.        Behavior: Behavior normal.     Procedures  Procedures  ED Course / MDM    Medical Decision Making Amount and/or Complexity of Data Reviewed Labs: ordered.  Risk Prescription drug management.   On assessment, patient resting on ED stretcher.  He is nausea improved and he was able to tolerate some p.o. intake in the ED.  He states that his nausea is currently coming back.  At this time, he is mildly tremulous.  He is a daily drinker and has not  drank since last night.  Dose of Librium  and Ativan  were given for treatment of what is likely alcohol withdrawal component.  On reassessment, patient states that he now feels better.  He does request discharge home.  He states that he would like to quit alcohol and does seem intent on doing so.  Will prescribe Librium  taper.  Patient was discharged in stable condition.       Melvenia Motto, MD 06/24/24 2105

## 2024-07-03 ENCOUNTER — Emergency Department (HOSPITAL_COMMUNITY)

## 2024-07-03 ENCOUNTER — Other Ambulatory Visit: Payer: Self-pay

## 2024-07-03 ENCOUNTER — Encounter (HOSPITAL_COMMUNITY): Payer: Self-pay

## 2024-07-03 ENCOUNTER — Emergency Department (HOSPITAL_COMMUNITY)
Admission: EM | Admit: 2024-07-03 | Discharge: 2024-07-03 | Disposition: A | Discharge status: Home / Self Care | Attending: Emergency Medicine | Admitting: Emergency Medicine

## 2024-07-03 DIAGNOSIS — S0081XA Abrasion of other part of head, initial encounter: Secondary | ICD-10-CM | POA: Diagnosis not present

## 2024-07-03 DIAGNOSIS — W1809XA Striking against other object with subsequent fall, initial encounter: Secondary | ICD-10-CM | POA: Insufficient documentation

## 2024-07-03 DIAGNOSIS — R569 Unspecified convulsions: Secondary | ICD-10-CM | POA: Diagnosis not present

## 2024-07-03 DIAGNOSIS — Y990 Civilian activity done for income or pay: Secondary | ICD-10-CM | POA: Insufficient documentation

## 2024-07-03 DIAGNOSIS — J329 Chronic sinusitis, unspecified: Secondary | ICD-10-CM | POA: Diagnosis not present

## 2024-07-03 DIAGNOSIS — S2020XA Contusion of thorax, unspecified, initial encounter: Secondary | ICD-10-CM | POA: Diagnosis not present

## 2024-07-03 LAB — CBC WITH DIFFERENTIAL/PLATELET
Abs Immature Granulocytes: 0.03 K/uL (ref 0.00–0.07)
Basophils Absolute: 0 K/uL (ref 0.0–0.1)
Basophils Relative: 1 %
Eosinophils Absolute: 0 K/uL (ref 0.0–0.5)
Eosinophils Relative: 1 %
HCT: 45.3 % (ref 39.0–52.0)
Hemoglobin: 15.5 g/dL (ref 13.0–17.0)
Immature Granulocytes: 0 %
Lymphocytes Relative: 18 %
Lymphs Abs: 1.3 K/uL (ref 0.7–4.0)
MCH: 32.4 pg (ref 26.0–34.0)
MCHC: 34.2 g/dL (ref 30.0–36.0)
MCV: 94.8 fL (ref 80.0–100.0)
Monocytes Absolute: 0.8 K/uL (ref 0.1–1.0)
Monocytes Relative: 11 %
Neutro Abs: 4.8 K/uL (ref 1.7–7.7)
Neutrophils Relative %: 69 %
Platelets: 290 K/uL (ref 150–400)
RBC: 4.78 MIL/uL (ref 4.22–5.81)
RDW: 13 % (ref 11.5–15.5)
WBC: 7 K/uL (ref 4.0–10.5)
nRBC: 0 % (ref 0.0–0.2)

## 2024-07-03 LAB — COMPREHENSIVE METABOLIC PANEL WITH GFR
ALT: 19 U/L (ref 0–44)
AST: 32 U/L (ref 15–41)
Albumin: 4.3 g/dL (ref 3.5–5.0)
Alkaline Phosphatase: 92 U/L (ref 38–126)
Anion gap: 13 (ref 5–15)
BUN: 7 mg/dL (ref 6–20)
CO2: 21 mmol/L — ABNORMAL LOW (ref 22–32)
Calcium: 9.9 mg/dL (ref 8.9–10.3)
Chloride: 99 mmol/L (ref 98–111)
Creatinine, Ser: 0.7 mg/dL (ref 0.61–1.24)
GFR, Estimated: >60 mL/min (ref >60)
Glucose, Bld: 106 mg/dL — ABNORMAL HIGH (ref 70–99)
Potassium: 3.6 mmol/L (ref 3.5–5.1)
Sodium: 133 mmol/L — ABNORMAL LOW (ref 135–145)
Total Bilirubin: 3.1 mg/dL — ABNORMAL HIGH (ref 0.0–1.2)
Total Protein: 7.9 g/dL (ref 6.5–8.1)

## 2024-07-03 LAB — MAGNESIUM: Magnesium: 2 mg/dL (ref 1.7–2.4)

## 2024-07-03 MED ORDER — LORAZEPAM 2 MG/ML IJ SOLN
2.0000 mg | Freq: Once | INTRAMUSCULAR | Status: AC
  Administered 2024-07-03: 2 mg via INTRAVENOUS
  Filled 2024-07-03: qty 1

## 2024-07-03 MED ORDER — ONDANSETRON HCL 4 MG/2ML IJ SOLN
4.0000 mg | Freq: Once | INTRAMUSCULAR | Status: AC
  Administered 2024-07-03: 4 mg via INTRAVENOUS
  Filled 2024-07-03: qty 2

## 2024-07-03 MED ORDER — LACOSAMIDE 200 MG/20ML IV SOLN
INTRAVENOUS | Status: AC
  Filled 2024-07-03: qty 20

## 2024-07-03 MED ORDER — SODIUM CHLORIDE 0.9 % IV BOLUS
1000.0000 mL | Freq: Once | INTRAVENOUS | Status: AC
  Administered 2024-07-03: 1000 mL via INTRAVENOUS

## 2024-07-03 MED ORDER — SODIUM CHLORIDE 0.9 % IV SOLN
200.0000 mg | Freq: Once | INTRAVENOUS | Status: AC
  Administered 2024-07-03: 200 mg via INTRAVENOUS
  Filled 2024-07-03: qty 20

## 2024-07-03 NOTE — ED Provider Notes (Signed)
  EMERGENCY DEPARTMENT AT [facility] Provider Note   CSN: [id number] Arrival date & time: 07/03/24  1742     Patient presents with: Seizures   Timothy [name] is a 28 y.o. male.   Patient is a 28 year old male who presents emergency department after he had a seizure at work.  Patient does have a history of seizure disorder and is supposed to be taking Vimpat .  He notes that he does have this medication at home as he did pick up the prescription but notes that he has not been taking it as he states that he is afraid to take it while drinking alcohol.  Patient does note that he is a daily drinker.  He did strike his head during the fall but denies any active headache, dizziness, lightheadedness at this time.  He denies any chest pain, shortness of breath, palpitations, abdominal pain, nausea, vomiting, diarrhea.  He notes that he otherwise feels at his baseline at this time.   Seizures      Prior to Admission medications   Medication Sig Start Date End Date Taking? Authorizing Provider  chlordiazePOXIDE  (LIBRIUM ) 25 MG capsule 50mg  PO TID x 1D, then 25-50mg  PO BID X 1D, then 25-50mg  PO QD X 1D 06/24/24   Melvenia Motto, MD  diazePAM , 20 MG Dose, (VALTOCO  20 MG DOSE) 2 x 10 MG/0.1ML LQPK Place 20 mg into the nose as needed (for seizure). 06/23/24   Gregg Lek, MD  Lacosamide  (VIMPAT ) 100 MG TABS Take 1 tablet (100 mg total) by mouth in the morning and at bedtime. 06/23/24   Camara, Amadou, MD  ondansetron  (ZOFRAN -ODT) 4 MG disintegrating tablet 4mg  ODT q4 hours prn nausea/vomit 06/24/24   Zammit, Joseph, MD  pantoprazole  (PROTONIX ) 20 MG tablet Take 1 tablet (20 mg total) by mouth daily. 06/24/24   Suzette Pac, MD  promethazine  (PHENERGAN ) 25 MG tablet Take 1 tablet (25 mg total) by mouth every 6 (six) hours as needed for nausea or vomiting. 06/12/24   Theadore Ozell HERO, MD    Allergies: Keppra  [levetiracetam ] and Amoxil [amoxicillin]    Review of Systems  Neurological:   Positive for seizures.  All other systems reviewed and are negative.   Updated Vital Signs BP 128/67   Pulse 76   Temp 98.8 F (37.1 C)   Resp 17   Ht 5' 4 (1.626 m)   Wt 67.6 kg   SpO2 96%   BMI 25.58 kg/m   Physical Exam Vitals and nursing note reviewed.  Constitutional:      General: He is not in acute distress.    Appearance: Normal appearance. He is not ill-appearing.  HENT:     Head: Normocephalic and atraumatic.     Nose: Nose normal.     Mouth/Throat:     Mouth: Mucous membranes are moist.  Eyes:     Extraocular Movements: Extraocular movements intact.     Conjunctiva/sclera: Conjunctivae normal.     Pupils: Pupils are equal, round, and reactive to light.  Cardiovascular:     Rate and Rhythm: Normal rate and regular rhythm.     Pulses: Normal pulses.     Heart sounds: Normal heart sounds. No murmur heard.    No gallop.  Pulmonary:     Effort: Pulmonary effort is normal. No respiratory distress.     Breath sounds: Normal breath sounds. No stridor. No wheezing, rhonchi or rales.  Abdominal:     General: Abdomen is flat. Bowel sounds are normal. There  is no distension.     Palpations: Abdomen is soft.     Tenderness: There is no abdominal tenderness. There is no guarding.  Musculoskeletal:        General: Normal range of motion.     Cervical back: Normal range of motion and neck supple. No rigidity or tenderness.  Skin:    General: Skin is warm and dry.     Comments: Superficial abrasions noted to the right side of the face  Neurological:     General: No focal deficit present.     Mental Status: He is alert and oriented to person, place, and time. Mental status is at baseline.     Cranial Nerves: No cranial nerve deficit.     Sensory: No sensory deficit.     Motor: No weakness.     Coordination: Coordination normal.     Gait: Gait normal.  Psychiatric:        Mood and Affect: Mood normal.        Behavior: Behavior normal.        Thought Content:  Thought content normal.        Judgment: Judgment normal.     (all labs ordered are listed, but only abnormal results are displayed) Labs Reviewed  COMPREHENSIVE METABOLIC PANEL WITH GFR - Abnormal; Notable for the following components:      Result Value   Sodium 133 (*)    CO2 21 (*)    Glucose, Bld 106 (*)    Total Bilirubin 3.1 (*)    All other components within normal limits  CBC WITH DIFFERENTIAL/PLATELET  MAGNESIUM   URINALYSIS, ROUTINE W REFLEX MICROSCOPIC  RAPID URINE DRUG SCREEN, HOSP PERFORMED    EKG: None  Radiology: [facility] Chest Port 1 View Result Date: 07/03/2024 CLINICAL DATA:  fall, chest wall bruising EXAM: PORTABLE CHEST - 1 VIEW COMPARISON:  June 12, 2024 FINDINGS: No focal airspace consolidation, pleural effusion, or pneumothorax. No cardiomegaly.No acute fracture or destructive lesion. IMPRESSION: No acute cardiopulmonary abnormality. Electronically Signed   By: Rogelia Myers M.D.   On: 07/03/2024 20:43   CT Head Wo Contrast Result Date: 07/03/2024 CLINICAL DATA:  Initial evaluation for acute seizure, closed head injury. EXAM: CT HEAD WITHOUT CONTRAST TECHNIQUE: Contiguous axial images were obtained from the base of the skull through the vertex without intravenous contrast. RADIATION DOSE REDUCTION: This exam was performed according to the departmental dose-optimization program which includes automated exposure control, adjustment of the mA and/or kV according to patient size and/or use of iterative reconstruction technique. COMPARISON:  Prior CT from 04/12/2024. FINDINGS: Brain: Cerebral volume within normal limits. No acute intracranial hemorrhage. No acute large vessel territory infarct. No mass lesion or midline shift. No hydrocephalus or extra-axial fluid collection. Vascular: No abnormal hyperdense vessel. Skull: No appreciable scalp soft tissue injury.  Calvarium intact. Sinuses/Orbits: Globes and orbital soft tissues within normal limits. Mild-to-moderate mucosal  thickening about the paranasal sinuses, likely inflammatory in nature. No mastoid effusion. Other: None. IMPRESSION: 1. No acute intracranial abnormality. 2. Mild to moderate inflammatory paranasal sinus disease. Electronically Signed   By: Morene Hoard M.D.   On: 07/03/2024 19:29     Procedures   Medications Ordered in the ED  sodium chloride  0.9 % bolus 1,000 mL (0 mLs Intravenous Stopped 07/03/24 1951)  ondansetron  (ZOFRAN ) injection 4 mg (4 mg Intravenous Given 07/03/24 1807)  LORazepam  (ATIVAN ) injection 2 mg (2 mg Intravenous Given 07/03/24 1807)  lacosamide  (VIMPAT ) 200 mg in sodium chloride  0.9 % 25  mL IVPB (0 mg Intravenous Stopped 07/03/24 1951)                                    Medical Decision Making Amount and/or Complexity of Data Reviewed Labs: ordered. Radiology: ordered.  Risk Prescription drug management.   This patient presents to the ED for concern of seizure differential diagnosis includes breakthrough seizure, medication noncompliance, intracranial hemorrhage, alcohol withdrawal    Additional history obtained:  Additional history obtained from family External records from outside source obtained and reviewed including medical records   Lab Tests:  I Ordered, and personally interpreted labs.  The pertinent results include: No leukocytosis, no anemia, mild hyponatremia, normal kidney function, normal liver function   Imaging Studies ordered:  I ordered imaging studies including CT scan of head, chest x-ray I independently visualized and interpreted imaging which showed no acute intracranial process, no acute cardiopulmonary process I agree with the radiologist interpretation   Medicines ordered and prescription drug management:  I ordered medication including Vimpat , IV fluids, Ativan , Zofran  for seizure Reevaluation of the patient after these medicines showed that the patient improved I have reviewed the patients home medicines and have  made adjustments as needed   Problem List / ED Course:  Patient is doing well at this time and is stable for discharge home.  Patient has been monitored in the emergency department for approximate 3 hours with no further seizure-like activity.  Long discussion was had with patient and mother about the importance of taking his Vimpat  as directed and patient notes that he will begin to do so.  He was directed to not drive over the next 6 months or operate heavy machinery.  Will provide a note for work until he can be evaluated by his neurologist which he sees on Tuesday of next week.  He was educated on the importance of alcohol cessation as well.  CT scan of the head was unremarkable at this point.  He had no other areas of focal tenderness noted on physical exam.  He had no lacerations that warranted suturing.  The importance of continued close follow-up with his neurologist, medication compliance, and seizure precautions were stressed to patient and family.  Strict turn precautions were discussed for any new or worsening symptoms.  Patient voiced understand to the plan and had no additional questions.   Social Determinants of Health:  None        Final diagnoses:  None    ED Discharge Orders     None          Daralene Lonni JONETTA DEVONNA 07/03/24 2108    Cleotilde Rogue, MD 07/04/24 [phone]

## 2024-07-03 NOTE — ED Triage Notes (Signed)
 Pt BIB ems from a seizure while at work. Per EMS pt was out of seizure and smoking a cigarette by the time they got to them.

## 2024-07-03 NOTE — Discharge Instructions (Signed)
 Please continue to take your Vimpat  as directed.  Please decrease your alcohol intake as well.  Follow-up closely with your neurologist on an outpatient basis.  Do not drive or operate heavy machinery for at least the next 6 months or until cleared by neurology to do so.  Return to emergency department immediately for any new or worsening symptoms.

## 2024-07-03 NOTE — ED Notes (Signed)
 Pt hasn't has not had another seizure at this time. A&Ox4

## 2024-07-08 ENCOUNTER — Other Ambulatory Visit: Admitting: *Deleted

## 2024-07-08 ENCOUNTER — Encounter: Payer: Self-pay | Admitting: *Deleted

## 2024-07-14 ENCOUNTER — Ambulatory Visit: Admitting: Neurology

## 2024-07-14 ENCOUNTER — Other Ambulatory Visit: Payer: Self-pay

## 2024-07-14 ENCOUNTER — Encounter (HOSPITAL_COMMUNITY): Payer: Self-pay | Admitting: *Deleted

## 2024-07-14 ENCOUNTER — Emergency Department (HOSPITAL_COMMUNITY)

## 2024-07-14 ENCOUNTER — Emergency Department (HOSPITAL_COMMUNITY)
Admission: EM | Admit: 2024-07-14 | Discharge: 2024-07-14 | Disposition: A | Discharge status: Home / Self Care | Attending: Emergency Medicine | Admitting: Emergency Medicine

## 2024-07-14 DIAGNOSIS — F1010 Alcohol abuse, uncomplicated: Secondary | ICD-10-CM | POA: Insufficient documentation

## 2024-07-14 DIAGNOSIS — K2900 Acute gastritis without bleeding: Secondary | ICD-10-CM | POA: Insufficient documentation

## 2024-07-14 DIAGNOSIS — K2920 Alcoholic gastritis without bleeding: Secondary | ICD-10-CM | POA: Diagnosis not present

## 2024-07-14 DIAGNOSIS — R112 Nausea with vomiting, unspecified: Secondary | ICD-10-CM | POA: Diagnosis not present

## 2024-07-14 DIAGNOSIS — Y906 Blood alcohol level of 120-199 mg/100 ml: Secondary | ICD-10-CM | POA: Diagnosis not present

## 2024-07-14 DIAGNOSIS — R109 Unspecified abdominal pain: Secondary | ICD-10-CM | POA: Diagnosis not present

## 2024-07-14 LAB — LIPASE, BLOOD: Lipase: 26 U/L (ref 11–51)

## 2024-07-14 LAB — COMPREHENSIVE METABOLIC PANEL WITH GFR
ALT: 73 U/L — ABNORMAL HIGH (ref 0–44)
AST: 89 U/L — ABNORMAL HIGH (ref 15–41)
Albumin: 4.9 g/dL (ref 3.5–5.0)
Alkaline Phosphatase: 108 U/L (ref 38–126)
Anion gap: 18 — ABNORMAL HIGH (ref 5–15)
BUN: 7 mg/dL (ref 6–20)
CO2: 23 mmol/L (ref 22–32)
Calcium: 9.7 mg/dL (ref 8.9–10.3)
Chloride: 99 mmol/L (ref 98–111)
Creatinine, Ser: 0.77 mg/dL (ref 0.61–1.24)
GFR, Estimated: >60 mL/min (ref >60)
Glucose, Bld: 75 mg/dL (ref 70–99)
Potassium: 3.5 mmol/L (ref 3.5–5.1)
Sodium: 140 mmol/L (ref 135–145)
Total Bilirubin: 1.5 mg/dL — ABNORMAL HIGH (ref 0.0–1.2)
Total Protein: 8.5 g/dL — ABNORMAL HIGH (ref 6.5–8.1)

## 2024-07-14 LAB — CBC WITH DIFFERENTIAL/PLATELET
Abs Immature Granulocytes: 0.01 K/uL (ref 0.00–0.07)
Basophils Absolute: 0.1 K/uL (ref 0.0–0.1)
Basophils Relative: 1 %
Eosinophils Absolute: 0.1 K/uL (ref 0.0–0.5)
Eosinophils Relative: 1 %
HCT: 49.8 % (ref 39.0–52.0)
Hemoglobin: 16.5 g/dL (ref 13.0–17.0)
Immature Granulocytes: 0 %
Lymphocytes Relative: 19 %
Lymphs Abs: 1.1 K/uL (ref 0.7–4.0)
MCH: 32.2 pg (ref 26.0–34.0)
MCHC: 33.1 g/dL (ref 30.0–36.0)
MCV: 97.1 fL (ref 80.0–100.0)
Monocytes Absolute: 0.4 K/uL (ref 0.1–1.0)
Monocytes Relative: 7 %
Neutro Abs: 4 K/uL (ref 1.7–7.7)
Neutrophils Relative %: 72 %
Platelets: 256 K/uL (ref 150–400)
RBC: 5.13 MIL/uL (ref 4.22–5.81)
RDW: 13.3 % (ref 11.5–15.5)
WBC: 5.7 K/uL (ref 4.0–10.5)
nRBC: 0 % (ref 0.0–0.2)

## 2024-07-14 LAB — ETHANOL: Alcohol, Ethyl (B): 179 mg/dL — ABNORMAL HIGH (ref <15)

## 2024-07-14 MED ORDER — PANTOPRAZOLE SODIUM 40 MG IV SOLR
40.0000 mg | Freq: Once | INTRAVENOUS | Status: AC
  Administered 2024-07-14: 40 mg via INTRAVENOUS
  Filled 2024-07-14: qty 10

## 2024-07-14 MED ORDER — ONDANSETRON 4 MG PO TBDP: ORAL_TABLET | ORAL | 0 refills | Status: DC

## 2024-07-14 MED ORDER — FAMOTIDINE 20 MG PO TABS: 20.0000 mg | ORAL_TABLET | Freq: Two times a day (BID) | ORAL | 0 refills | Status: AC

## 2024-07-14 MED ORDER — SODIUM CHLORIDE 0.9 % IV BOLUS
1000.0000 mL | Freq: Once | INTRAVENOUS | Status: AC
  Administered 2024-07-14: 1000 mL via INTRAVENOUS

## 2024-07-14 MED ORDER — DIPHENHYDRAMINE HCL 50 MG/ML IJ SOLN
25.0000 mg | Freq: Once | INTRAMUSCULAR | Status: AC
  Administered 2024-07-14: 25 mg via INTRAVENOUS
  Filled 2024-07-14: qty 1

## 2024-07-14 MED ORDER — ONDANSETRON HCL 4 MG/2ML IJ SOLN
4.0000 mg | Freq: Once | INTRAMUSCULAR | Status: AC
  Administered 2024-07-14: 4 mg via INTRAVENOUS
  Filled 2024-07-14: qty 2

## 2024-07-14 NOTE — ED Provider Notes (Signed)
 [place] EMERGENCY DEPARTMENT AT [facility] Provider Note   CSN: [id number] Arrival date & time: 07/14/24  1216     Patient presents with: Emesis   Timothy [name] is a 28 y.o. male.   Patient complains of nausea and vomiting.  Patient denies use of marijuana and has been drinking alcohol  The history is provided by the patient and medical records. No language interpreter was used.  Emesis Severity:  Moderate Timing:  Intermittent Quality:  Bilious material Able to tolerate:  Liquids Progression:  Unchanged Chronicity:  Recurrent Recent urination:  Normal Context: not post-tussive   Relieved by:  Nothing Worsened by:  Nothing Ineffective treatments:  None tried Associated symptoms: no abdominal pain, no cough, no diarrhea and no headaches        Prior to Admission medications   Medication Sig Start Date End Date Taking? Authorizing Provider  famotidine  (PEPCID ) 20 MG tablet Take 1 tablet (20 mg total) by mouth 2 (two) times daily. 07/14/24  Yes Sherman Donaldson, MD  ondansetron  (ZOFRAN -ODT) 4 MG disintegrating tablet 4mg  ODT q4 hours prn nausea/vomit 07/14/24  Yes Kirin Pastorino, MD  chlordiazePOXIDE  (LIBRIUM ) 25 MG capsule 50mg  PO TID x 1D, then 25-50mg  PO BID X 1D, then 25-50mg  PO QD X 1D 06/24/24   Melvenia Motto, MD  diazePAM , 20 MG Dose, (VALTOCO  20 MG DOSE) 2 x 10 MG/0.1ML LQPK Place 20 mg into the nose as needed (for seizure). 06/23/24   Camara, Amadou, MD  Lacosamide  (VIMPAT ) 100 MG TABS Take 1 tablet (100 mg total) by mouth in the morning and at bedtime. 06/23/24   Camara, Amadou, MD  pantoprazole  (PROTONIX ) 20 MG tablet Take 1 tablet (20 mg total) by mouth daily. 06/24/24   Adrianah Prophete, MD  promethazine  (PHENERGAN ) 25 MG tablet Take 1 tablet (25 mg total) by mouth every 6 (six) hours as needed for nausea or vomiting. 06/12/24   Theadore Ozell HERO, MD    Allergies: Keppra  [levetiracetam ] and Amoxil [amoxicillin]    Review of Systems  Constitutional:  Negative for  appetite change and fatigue.  HENT:  Negative for congestion, ear discharge and sinus pressure.   Eyes:  Negative for discharge.  Respiratory:  Negative for cough.   Cardiovascular:  Negative for chest pain.  Gastrointestinal:  Positive for vomiting. Negative for abdominal pain and diarrhea.  Genitourinary:  Negative for frequency and hematuria.  Musculoskeletal:  Negative for back pain.  Skin:  Negative for rash.  Neurological:  Negative for seizures and headaches.  Psychiatric/Behavioral:  Negative for hallucinations.     Updated Vital Signs BP (!) 128/94 (BP Location: Right Arm)   Pulse 94   Temp 98.2 F (36.8 C) (Oral)   Resp 18   Ht 5' 4 (1.626 m)   Wt 68 kg   SpO2 99%   BMI 25.75 kg/m   Physical Exam Vitals and nursing note reviewed.  Constitutional:      Appearance: He is well-developed.  HENT:     Head: Normocephalic.     Nose: Nose normal.  Eyes:     General: No scleral icterus.    Conjunctiva/sclera: Conjunctivae normal.  Neck:     Thyroid: No thyromegaly.  Cardiovascular:     Rate and Rhythm: Normal rate and regular rhythm.     Heart sounds: No murmur heard.    No friction rub. No gallop.  Pulmonary:     Breath sounds: No stridor. No wheezing or rales.  Chest:     Chest  wall: No tenderness.  Abdominal:     General: There is no distension.     Tenderness: There is no abdominal tenderness. There is no rebound.  Musculoskeletal:        General: Normal range of motion.     Cervical back: Neck supple.  Lymphadenopathy:     Cervical: No cervical adenopathy.  Skin:    Findings: No erythema or rash.  Neurological:     Mental Status: He is alert and oriented to person, place, and time.     Motor: No abnormal muscle tone.     Coordination: Coordination normal.  Psychiatric:        Behavior: Behavior normal.     (all labs ordered are listed, but only abnormal results are displayed) Labs Reviewed  COMPREHENSIVE METABOLIC PANEL WITH GFR - Abnormal;  Notable for the following components:      Result Value   Total Protein 8.5 (*)    AST 89 (*)    ALT 73 (*)    Total Bilirubin 1.5 (*)    Anion gap 18 (*)    All other components within normal limits  ETHANOL - Abnormal; Notable for the following components:   Alcohol, Ethyl (B) 179 (*)    All other components within normal limits  CBC WITH DIFFERENTIAL/PLATELET  LIPASE, BLOOD    EKG: None  Radiology: DG ABD ACUTE 2+V W 1V CHEST Result Date: 07/14/2024 CLINICAL DATA:  Nausea and vomiting with abdominal pain and headache. EXAM: DG ABDOMEN ACUTE WITH 1 VIEW CHEST COMPARISON:  July 03, 2024 FINDINGS: There is no evidence of dilated bowel loops or free intraperitoneal air. No radiopaque calculi or other significant radiographic abnormality is seen. Heart size and mediastinal contours are within normal limits. Both lungs are clear. IMPRESSION: Negative abdominal radiographs.  No acute cardiopulmonary disease. Electronically Signed   By: Suzen Dials M.D.   On: 07/14/2024 14:36     Procedures   Medications Ordered in the ED  sodium chloride  0.9 % bolus 1,000 mL (0 mLs Intravenous Stopped 07/14/24 1508)  pantoprazole  (PROTONIX ) injection 40 mg (40 mg Intravenous Given 07/14/24 1319)  diphenhydrAMINE  (BENADRYL ) injection 25 mg (25 mg Intravenous Given 07/14/24 1335)  ondansetron  (ZOFRAN ) injection 4 mg (4 mg Intravenous Given 07/14/24 1505)                                    Medical Decision Making Amount and/or Complexity of Data Reviewed Labs: ordered. Radiology: ordered.  Risk Prescription drug management.   Patient with vomiting.  Probably gastritis.  Patient improved with fluids and nausea medicine will be sent home with Pepcid  and Zofran     Final diagnoses:  ETOH abuse  Acute superficial gastritis without hemorrhage    ED Discharge Orders          Ordered    ondansetron  (ZOFRAN -ODT) 4 MG disintegrating tablet        07/14/24 1451    famotidine  (PEPCID ) 20 MG  tablet  2 times daily        07/14/24 1451               Suzette Pac, MD 07/14/24 1652

## 2024-07-14 NOTE — ED Triage Notes (Signed)
 Pt with N/V for past 2 hours.  Abd pain and HA at times, denies either at present. Pt admits to using marijuana last night.

## 2024-07-14 NOTE — Discharge Instructions (Signed)
 Follow-up with your family doctor later this week.  Decrease your alcohol consumption

## 2024-07-22 ENCOUNTER — Encounter: Payer: Self-pay | Admitting: *Deleted

## 2024-07-22 ENCOUNTER — Other Ambulatory Visit: Admitting: *Deleted

## 2024-07-22 ENCOUNTER — Telehealth: Payer: Self-pay | Admitting: Neurology

## 2024-07-22 NOTE — Telephone Encounter (Signed)
 Patient's mother cancelled appointment for today for EEG due to patient being sick

## 2024-07-28 ENCOUNTER — Inpatient Hospital Stay: Payer: Self-pay | Admitting: Family Medicine

## 2024-07-30 ENCOUNTER — Ambulatory Visit: Payer: Self-pay | Admitting: Neurology

## 2024-07-30 ENCOUNTER — Encounter: Payer: Self-pay | Admitting: Neurology

## 2024-07-30 ENCOUNTER — Ambulatory Visit (INDEPENDENT_AMBULATORY_CARE_PROVIDER_SITE_OTHER): Admitting: Neurology

## 2024-07-30 DIAGNOSIS — G40309 Generalized idiopathic epilepsy and epileptic syndromes, not intractable, without status epilepticus: Secondary | ICD-10-CM | POA: Diagnosis not present

## 2024-07-30 NOTE — Procedures (Signed)
   History:  28 year old man with seizure   EEG classification:  Awake and asleep  Duration: 26 minutes   Technical aspects: This EEG study was done with scalp electrodes positioned according to the 10-20 International system of electrode placement. Electrical activity was reviewed with band pass filter of 1-70Hz , sensitivity of 7 uV/mm, display speed of 62mm/sec with a 60Hz  notched filter applied as appropriate. EEG data were recorded continuously and digitally stored.   Description of the recording: The background rhythms of this recording consists of a fairly well modulated medium amplitude background activity of 10 Hz. As the record progresses, the patient initially is in the waking state, but appears to enter the early stage II sleep during the recording, with rudimentary sleep spindles and vertex sharp wave activity seen. During the wakeful state, photic stimulation was performed, and no abnormal responses were seen. Hyperventilation was also performed, no abnormal response seen. No epileptiform discharges seen during this recording. There was no focal slowing.   Abnormality: None   Impression: This is a normal awake and sleep EEG. No evidence of interictal epileptiform discharges. Normal EEGs, however, do not rule out epilepsy.    Margaruite Top, MD [facility]

## 2024-08-27 ENCOUNTER — Ambulatory Visit

## 2024-09-24 ENCOUNTER — Emergency Department (HOSPITAL_COMMUNITY)
Admission: EM | Admit: 2024-09-24 | Discharge: 2024-09-24 | Disposition: A | Discharge status: Home / Self Care | Payer: MEDICAID | Attending: Emergency Medicine | Admitting: Emergency Medicine

## 2024-09-24 ENCOUNTER — Encounter (HOSPITAL_COMMUNITY): Payer: Self-pay

## 2024-09-24 ENCOUNTER — Other Ambulatory Visit: Payer: Self-pay

## 2024-09-24 DIAGNOSIS — Z79899 Other long term (current) drug therapy: Secondary | ICD-10-CM | POA: Diagnosis not present

## 2024-09-24 DIAGNOSIS — Y900 Blood alcohol level of less than 20 mg/100 ml: Secondary | ICD-10-CM | POA: Insufficient documentation

## 2024-09-24 DIAGNOSIS — R251 Tremor, unspecified: Secondary | ICD-10-CM | POA: Diagnosis not present

## 2024-09-24 DIAGNOSIS — R112 Nausea with vomiting, unspecified: Secondary | ICD-10-CM | POA: Insufficient documentation

## 2024-09-24 DIAGNOSIS — F10939 Alcohol use, unspecified with withdrawal, unspecified: Secondary | ICD-10-CM

## 2024-09-24 DIAGNOSIS — R1033 Periumbilical pain: Secondary | ICD-10-CM | POA: Insufficient documentation

## 2024-09-24 DIAGNOSIS — F10239 Alcohol dependence with withdrawal, unspecified: Secondary | ICD-10-CM | POA: Diagnosis not present

## 2024-09-24 LAB — CBC WITH DIFFERENTIAL/PLATELET
Abs Immature Granulocytes: 0.01 K/uL (ref 0.00–0.07)
Basophils Absolute: 0.1 K/uL (ref 0.0–0.1)
Basophils Relative: 1 %
Eosinophils Absolute: 0.1 K/uL (ref 0.0–0.5)
Eosinophils Relative: 1 %
HCT: 48.7 % (ref 39.0–52.0)
Hemoglobin: 16.6 g/dL (ref 13.0–17.0)
Immature Granulocytes: 0 %
Lymphocytes Relative: 18 %
Lymphs Abs: 1.4 K/uL (ref 0.7–4.0)
MCH: 32.1 pg (ref 26.0–34.0)
MCHC: 34.1 g/dL (ref 30.0–36.0)
MCV: 94.2 fL (ref 80.0–100.0)
Monocytes Absolute: 0.7 K/uL (ref 0.1–1.0)
Monocytes Relative: 9 %
Neutro Abs: 5.6 K/uL (ref 1.7–7.7)
Neutrophils Relative %: 71 %
Platelets: 334 K/uL (ref 150–400)
RBC: 5.17 MIL/uL (ref 4.22–5.81)
RDW: 13.6 % (ref 11.5–15.5)
WBC: 7.9 K/uL (ref 4.0–10.5)
nRBC: 0 % (ref 0.0–0.2)

## 2024-09-24 LAB — COMPREHENSIVE METABOLIC PANEL WITH GFR
ALT: 39 U/L (ref 0–44)
AST: 50 U/L — ABNORMAL HIGH (ref 15–41)
Albumin: 4.8 g/dL (ref 3.5–5.0)
Alkaline Phosphatase: 120 U/L (ref 38–126)
Anion gap: 20 — ABNORMAL HIGH (ref 5–15)
BUN: 8 mg/dL (ref 6–20)
CO2: 22 mmol/L (ref 22–32)
Calcium: 10.1 mg/dL (ref 8.9–10.3)
Chloride: 99 mmol/L (ref 98–111)
Creatinine, Ser: 0.73 mg/dL (ref 0.61–1.24)
GFR, Estimated: >60 mL/min (ref >60)
Glucose, Bld: 77 mg/dL (ref 70–99)
Potassium: 3.8 mmol/L (ref 3.5–5.1)
Sodium: 141 mmol/L (ref 135–145)
Total Bilirubin: 2 mg/dL — ABNORMAL HIGH (ref 0.0–1.2)
Total Protein: 8.3 g/dL — ABNORMAL HIGH (ref 6.5–8.1)

## 2024-09-24 LAB — MAGNESIUM: Magnesium: 2 mg/dL (ref 1.7–2.4)

## 2024-09-24 LAB — LIPASE, BLOOD: Lipase: 14 U/L (ref 11–51)

## 2024-09-24 LAB — ETHANOL: Alcohol, Ethyl (B): <15 mg/dL (ref <15)

## 2024-09-24 MED ORDER — LACTATED RINGERS IV BOLUS
1000.0000 mL | Freq: Once | INTRAVENOUS | Status: AC
  Administered 2024-09-24: 1000 mL via INTRAVENOUS

## 2024-09-24 MED ORDER — DROPERIDOL 2.5 MG/ML IJ SOLN
1.2500 mg | Freq: Once | INTRAMUSCULAR | Status: AC
  Administered 2024-09-24: 1.25 mg via INTRAVENOUS
  Filled 2024-09-24: qty 2

## 2024-09-24 MED ORDER — DIAZEPAM 5 MG/ML IJ SOLN
10.0000 mg | Freq: Once | INTRAMUSCULAR | Status: AC
  Administered 2024-09-24: 10 mg via INTRAVENOUS
  Filled 2024-09-24: qty 2

## 2024-09-24 MED ORDER — ONDANSETRON 4 MG PO TBDP: ORAL_TABLET | ORAL | 0 refills | Status: DC

## 2024-09-24 MED ORDER — ONDANSETRON 4 MG PO TBDP
4.0000 mg | ORAL_TABLET | Freq: Once | ORAL | Status: AC
  Administered 2024-09-24: 4 mg via ORAL
  Filled 2024-09-24: qty 1

## 2024-09-24 MED ORDER — DIPHENHYDRAMINE HCL 50 MG/ML IJ SOLN
12.5000 mg | Freq: Once | INTRAMUSCULAR | Status: AC
  Administered 2024-09-24: 12.5 mg via INTRAVENOUS
  Filled 2024-09-24: qty 1

## 2024-09-24 NOTE — ED Provider Notes (Signed)
 [place] EMERGENCY DEPARTMENT AT [facility] Provider Note   CSN: [id number] Arrival date & time: 09/24/24  1242     Patient presents with: Emesis   Timothy [name] is a 28 y.o. male.  {Add pertinent medical, surgical, social history, OB history to HPI:2422} 28 year old male with a history of cannabinoid hyperemesis, alcohol abuse, and withdrawal seizures who presents to the emergency department with nausea and vomiting.  2 hours prior to arrival patient started developing profuse nausea and vomiting.  Has vomited innumerable amount of times is nonbloody nonbilious.  Has had several loose stools as well.  Has developed some abdominal cramping as a result of the vomiting but has not had any significant pain.  No fevers or chills.  Typically drinks 12 pack of beer per day and uses marijuana daily as well.  Last drink was last night.  Says he developed some tremors as well and is concerned that he may be in mild alcohol withdrawal.  Does have a history of withdrawal seizures in the past.  Was given Zofran  in triage without relief. No abdominal surgeries       Prior to Admission medications   Medication Sig Start Date End Date Taking? Authorizing Provider  chlordiazePOXIDE  (LIBRIUM ) 25 MG capsule 50mg  PO TID x 1D, then 25-50mg  PO BID X 1D, then 25-50mg  PO QD X 1D 06/24/24   Melvenia Motto, MD  diazePAM , 20 MG Dose, (VALTOCO  20 MG DOSE) 2 x 10 MG/0.1ML LQPK Place 20 mg into the nose as needed (for seizure). 06/23/24   [name], Amadou, MD  famotidine  (PEPCID ) 20 MG tablet Take 1 tablet (20 mg total) by mouth 2 (two) times daily. 07/14/24   Timothy Pac, MD  Lacosamide  (VIMPAT ) 100 MG TABS Take 1 tablet (100 mg total) by mouth in the morning and at bedtime. 06/23/24   Timothy Lek, MD  ondansetron  (ZOFRAN -ODT) 4 MG disintegrating tablet 4mg  ODT q4 hours prn nausea/vomit 07/14/24   [name], Joseph, MD  pantoprazole  (PROTONIX ) 20 MG tablet Take 1 tablet (20 mg total) by mouth daily. 06/24/24    [name], Joseph, MD  promethazine  (PHENERGAN ) 25 MG tablet Take 1 tablet (25 mg total) by mouth every 6 (six) hours as needed for nausea or vomiting. 06/12/24   Timothy Ozell HERO, MD    Allergies: Keppra  [levetiracetam ] and Amoxil [amoxicillin]    Review of Systems  Updated Vital Signs BP (!) 156/94 (BP Location: Right Arm)   Pulse 97   Temp 97.8 F (36.6 C)   Resp 16   Ht 5' 4 (1.626 m)   Wt 68 kg   SpO2 94%   BMI 25.73 kg/m   Physical Exam Vitals and nursing note reviewed.  Constitutional:      General: He is not in acute distress.    Appearance: He is well-developed.  HENT:     Head: Normocephalic and atraumatic.     Right Ear: External ear normal.     Left Ear: External ear normal.     Nose: Nose normal.  Eyes:     Extraocular Movements: Extraocular movements intact.     Conjunctiva/sclera: Conjunctivae normal.     Pupils: Pupils are equal, round, and reactive to light.  Cardiovascular:     Rate and Rhythm: Normal rate and regular rhythm.     Heart sounds: Normal heart sounds.  Pulmonary:     Effort: Pulmonary effort is normal. No respiratory distress.     Breath sounds: Normal breath sounds.  Abdominal:  General: There is no distension.     Palpations: Abdomen is soft. There is no mass.     Tenderness: There is abdominal tenderness (Mild.  Periumbilical.  No right lower quadrant tenderness to palpation.). There is no guarding.  Musculoskeletal:     Cervical back: Normal range of motion and neck supple.     Right lower leg: No edema.     Left lower leg: No edema.  Skin:    General: Skin is warm and dry.  Neurological:     Mental Status: He is alert. Mental status is at baseline.     Comments: Mild tremor and tongue fasciculation  Psychiatric:        Mood and Affect: Mood normal.        Behavior: Behavior normal.     (all labs ordered are listed, but only abnormal results are displayed) Labs Reviewed  COMPREHENSIVE METABOLIC PANEL WITH GFR - Abnormal;  Notable for the following components:      Result Value   Total Protein 8.3 (*)    AST 50 (*)    Total Bilirubin 2.0 (*)    Anion gap 20 (*)    All other components within normal limits  LIPASE, BLOOD  CBC WITH DIFFERENTIAL/PLATELET  ETHANOL    EKG: None  Radiology: No results found.  {Document cardiac monitor, telemetry assessment procedure when appropriate:32947} Procedures   Medications Ordered in the ED  ondansetron  (ZOFRAN -ODT) disintegrating tablet 4 mg (4 mg Oral Given 09/24/24 1301)      {Click here for ABCD2, HEART and other calculators REFRESH Note before signing:1}                              Medical Decision Making Amount and/or Complexity of Data Reviewed Labs: ordered.  Risk Prescription drug management.   Timothy [name] is a 29 year old male with a history of cannabinoid hyperemesis, alcohol abuse, and withdrawal seizures who presents to the emergency department with nausea and vomiting.   Initial Ddx:  Cannabinoid hyperemesis syndrome, gastroenteritis, alcohol withdrawal, electrolyte abnormality, appendicitis, bowel obstruction  MDM/Course:  3:38 PM Patient presents emergency department with nausea and vomiting.  Does have a history of daily marijuana use and cannabinoid hyperemesis which I suspect is causing his symptoms today.  Does have some mild periumbilical tenderness to palpation and abdominal pain that started after his vomiting which I suspect is from his retching.  Does have a history of daily alcohol use and some mild tremors as well so I suspect that he may be going into mild alcohol withdrawals currently.  Did have lab work that was sent from triage which showed a mildly elevated bilirubin which I suspect is due to his drinking.  Lipase WNL making pancreatitis unlikely.  Alcohol level also undetectably low.  Given his abdominal exam low concern for appendicitis or bowel obstruction that would warrant cross-sectional imaging.  Will give him  droperidol , fluids, and Valium  at this point in time and reassess.   Upon re-evaluation ***  This patient presents to the ED for concern of complaints listed in HPI, this involves an extensive number of treatment options, and is a complaint that carries with it a high risk of complications and morbidity. Disposition including potential need for admission considered.   Dispo: {Disposition:28069}  Additional history obtained from {Additional History:28067} Records reviewed {Records Reviewed:28068} The following labs were independently interpreted: {labs interpreted:28064} and show {lab findings:28250} I independently reviewed the following  imaging with scope of interpretation limited to determining acute life threatening conditions related to emergency care: {imaging interpreted:28065} and agree with the radiologist interpretation with the following exceptions: none I personally reviewed and interpreted cardiac monitoring: {cardiac monitoring:28251} I personally reviewed and interpreted the pt's EKG: see above for interpretation  I have reviewed the patients home medications and made adjustments as needed Consults: {Consultants:28063} Social Determinants of health:  ***  Portions of this note were generated with Scientist, clinical (histocompatibility and immunogenetics). Dictation errors may occur despite best attempts at proofreading.     Final diagnoses:  None    ED Discharge Orders     None

## 2024-09-24 NOTE — Discharge Instructions (Signed)
 You were seen for your nausea and vomiting in the emergency department.   At home, please take the Zofran  we prescribed you.    Check your MyChart online for the results of any tests that had not resulted by the time you left the emergency department.   Follow-up with your primary doctor in 2-3 days regarding your visit.    Return immediately to the emergency department if you experience any of the following: Shaking, seizures, severe abdominal pain, vomiting despite the medicine, or any other concerning symptoms.    Thank you for visiting our Emergency Department. It was a pleasure taking care of you today.

## 2024-09-24 NOTE — ED Triage Notes (Signed)
 Pt arrived via [facility] c/o emesis that began apprx 2 hrs PTA. Pt reports he was drinking ETOH last night and was smoking marijuana. Pt does endorse abdominal cramping.

## 2024-09-30 ENCOUNTER — Inpatient Hospital Stay: Payer: Self-pay | Admitting: Family Medicine

## 2024-10-18 ENCOUNTER — Other Ambulatory Visit: Payer: Self-pay

## 2024-10-18 ENCOUNTER — Emergency Department (HOSPITAL_COMMUNITY)
Admission: EM | Admit: 2024-10-18 | Discharge: 2024-10-18 | Disposition: A | Discharge status: Home / Self Care | Payer: MEDICAID | Attending: Emergency Medicine | Admitting: Emergency Medicine

## 2024-10-18 ENCOUNTER — Encounter (HOSPITAL_COMMUNITY): Payer: Self-pay

## 2024-10-18 ENCOUNTER — Emergency Department (HOSPITAL_COMMUNITY): Payer: MEDICAID

## 2024-10-18 DIAGNOSIS — S0003XA Contusion of scalp, initial encounter: Secondary | ICD-10-CM | POA: Diagnosis not present

## 2024-10-18 DIAGNOSIS — W06XXXA Fall from bed, initial encounter: Secondary | ICD-10-CM | POA: Diagnosis not present

## 2024-10-18 DIAGNOSIS — R799 Abnormal finding of blood chemistry, unspecified: Secondary | ICD-10-CM | POA: Diagnosis not present

## 2024-10-18 DIAGNOSIS — Z91148 Patient's other noncompliance with medication regimen for other reason: Secondary | ICD-10-CM | POA: Diagnosis not present

## 2024-10-18 DIAGNOSIS — S0990XA Unspecified injury of head, initial encounter: Secondary | ICD-10-CM | POA: Diagnosis present

## 2024-10-18 DIAGNOSIS — R569 Unspecified convulsions: Secondary | ICD-10-CM

## 2024-10-18 DIAGNOSIS — G40909 Epilepsy, unspecified, not intractable, without status epilepticus: Secondary | ICD-10-CM | POA: Insufficient documentation

## 2024-10-18 DIAGNOSIS — F1090 Alcohol use, unspecified, uncomplicated: Secondary | ICD-10-CM | POA: Insufficient documentation

## 2024-10-18 LAB — COMPREHENSIVE METABOLIC PANEL WITH GFR
ALT: 124 U/L — ABNORMAL HIGH (ref 0–44)
AST: 98 U/L — ABNORMAL HIGH (ref 15–41)
Albumin: 4.8 g/dL (ref 3.5–5.0)
Alkaline Phosphatase: 107 U/L (ref 38–126)
Anion gap: 17 — ABNORMAL HIGH (ref 5–15)
BUN: 7 mg/dL (ref 6–20)
CO2: 19 mmol/L — ABNORMAL LOW (ref 22–32)
Calcium: 9.9 mg/dL (ref 8.9–10.3)
Chloride: 103 mmol/L (ref 98–111)
Creatinine, Ser: 0.82 mg/dL (ref 0.61–1.24)
GFR, Estimated: >60 mL/min (ref >60)
Glucose, Bld: 135 mg/dL — ABNORMAL HIGH (ref 70–99)
Potassium: 4.1 mmol/L (ref 3.5–5.1)
Sodium: 139 mmol/L (ref 135–145)
Total Bilirubin: 1 mg/dL (ref 0.0–1.2)
Total Protein: 7.8 g/dL (ref 6.5–8.1)

## 2024-10-18 LAB — CBC
HCT: 48.2 % (ref 39.0–52.0)
Hemoglobin: 15.8 g/dL (ref 13.0–17.0)
MCH: 31.9 pg (ref 26.0–34.0)
MCHC: 32.8 g/dL (ref 30.0–36.0)
MCV: 97.2 fL (ref 80.0–100.0)
Platelets: 224 K/uL (ref 150–400)
RBC: 4.96 MIL/uL (ref 4.22–5.81)
RDW: 13.7 % (ref 11.5–15.5)
WBC: 5.1 K/uL (ref 4.0–10.5)
nRBC: 0 % (ref 0.0–0.2)

## 2024-10-18 LAB — ETHANOL: Alcohol, Ethyl (B): <15 mg/dL (ref <15)

## 2024-10-18 LAB — CBG MONITORING, ED: Glucose-Capillary: 122 mg/dL — ABNORMAL HIGH (ref 70–99)

## 2024-10-18 MED ORDER — ONDANSETRON 4 MG PO TBDP: 4.0000 mg | ORAL_TABLET | Freq: Three times a day (TID) | ORAL | 0 refills | Status: AC | PRN

## 2024-10-18 MED ORDER — SODIUM CHLORIDE 0.9 % IV SOLN
100.0000 mg | Freq: Once | INTRAVENOUS | Status: AC
  Administered 2024-10-18: 100 mg via INTRAVENOUS
  Filled 2024-10-18: qty 10

## 2024-10-18 MED ORDER — ONDANSETRON HCL 4 MG/2ML IJ SOLN
4.0000 mg | Freq: Once | INTRAMUSCULAR | Status: AC
Start: 2024-10-18 — End: 2024-10-18
  Administered 2024-10-18: 4 mg via INTRAVENOUS
  Filled 2024-10-18: qty 2

## 2024-10-18 MED ORDER — LACOSAMIDE 100 MG PO TABS: 100.0000 mg | ORAL_TABLET | Freq: Two times a day (BID) | ORAL | 5 refills | Status: AC

## 2024-10-18 MED ORDER — SODIUM CHLORIDE 0.9 % IV BOLUS
1000.0000 mL | Freq: Once | INTRAVENOUS | Status: AC
Start: 2024-10-18 — End: 2024-10-18
  Administered 2024-10-18: 1000 mL via INTRAVENOUS

## 2024-10-18 MED ORDER — PROCHLORPERAZINE EDISYLATE 10 MG/2ML IJ SOLN
5.0000 mg | Freq: Once | INTRAMUSCULAR | Status: AC
  Administered 2024-10-18: 5 mg via INTRAVENOUS
  Filled 2024-10-18: qty 2

## 2024-10-18 NOTE — Discharge Instructions (Addendum)
 Take your medicines.  No driving until you have been seizure-free for 6 months and you have been cleared by neurology.  Follow-up with your neurologist.

## 2024-10-18 NOTE — ED Provider Notes (Signed)
 [place] EMERGENCY DEPARTMENT AT [facility] Provider Note   CSN: [id number] Arrival date & time: 10/18/24  9244     Patient presents with: Seizures   Timothy [name] is a 28 y.o. male.    Seizures Patient presents with seizure activity.  History of seizures.  Is on Vimpat  without good compliance.  Had tonic-clonic seizure.  Reportedly hit head.  Now having vomiting.  Also had nasal Valium .  Had not been vomiting before the episode.  Reported not compliant with medication.     Prior to Admission medications   Medication Sig Start Date End Date Taking? Authorizing Provider  ondansetron  (ZOFRAN -ODT) 4 MG disintegrating tablet Take 1 tablet (4 mg total) by mouth every 8 (eight) hours as needed for nausea or vomiting. 10/18/24  Yes Patsey Lot, MD  chlordiazePOXIDE  (LIBRIUM ) 25 MG capsule 50mg  PO TID x 1D, then 25-50mg  PO BID X 1D, then 25-50mg  PO QD X 1D 06/24/24   Melvenia Motto, MD  diazePAM , 20 MG Dose, (VALTOCO  20 MG DOSE) 2 x 10 MG/0.1ML LQPK Place 20 mg into the nose as needed (for seizure). 06/23/24   Camara, Amadou, MD  famotidine  (PEPCID ) 20 MG tablet Take 1 tablet (20 mg total) by mouth 2 (two) times daily. 07/14/24   Suzette Pac, MD  Lacosamide  (VIMPAT ) 100 MG TABS Take 1 tablet (100 mg total) by mouth in the morning and at bedtime. 10/18/24   Patsey Lot, MD  pantoprazole  (PROTONIX ) 20 MG tablet Take 1 tablet (20 mg total) by mouth daily. 06/24/24   Suzette Pac, MD  promethazine  (PHENERGAN ) 25 MG tablet Take 1 tablet (25 mg total) by mouth every 6 (six) hours as needed for nausea or vomiting. 06/12/24   Theadore Ozell HERO, MD    Allergies: Keppra  [levetiracetam ] and Amoxil [amoxicillin]    Review of Systems  Neurological:  Positive for seizures.    Updated Vital Signs BP 132/88 (BP Location: Left Arm)   Pulse 86   Temp 97.9 F (36.6 C) (Oral)   Resp 18   Ht 5' 4 (1.626 m)   Wt 68 kg   SpO2 100%   BMI 25.73 kg/m   Physical Exam Vitals and  nursing note reviewed.  HENT:     Head:     Comments: Abrasion/hematoma to scalp.  Bite to tongue. Cardiovascular:     Rate and Rhythm: Tachycardia present.  Pulmonary:     Effort: Pulmonary effort is normal.  Abdominal:     Tenderness: There is no abdominal tenderness.  Neurological:     Mental Status: He is alert. Mental status is at baseline.     (all labs ordered are listed, but only abnormal results are displayed) Labs Reviewed  COMPREHENSIVE METABOLIC PANEL WITH GFR - Abnormal; Notable for the following components:      Result Value   CO2 19 (*)    Glucose, Bld 135 (*)    AST 98 (*)    ALT 124 (*)    Anion gap 17 (*)    All other components within normal limits  CBG MONITORING, ED - Abnormal; Notable for the following components:   Glucose-Capillary 122 (*)    All other components within normal limits  ETHANOL  CBC    EKG: None  Radiology: CT Head Wo Contrast Result Date: 10/18/2024 EXAM: CT HEAD WITHOUT 10/18/2024 09:09:56 AM TECHNIQUE: CT of the head was performed without the administration of intravenous contrast. Automated exposure control, iterative reconstruction, and/or weight based adjustment of the  mA/kV was utilized to reduce the radiation dose to as low as reasonably achievable. COMPARISON: 07/03/2024 CLINICAL HISTORY: Head trauma, moderate-severe FINDINGS: BRAIN AND VENTRICLES: No acute intracranial hemorrhage. No mass effect or midline shift. No extra-axial fluid collection. No evidence of acute infarct. No hydrocephalus. ORBITS: No acute abnormality. SINUSES AND MASTOIDS: No acute abnormality. SOFT TISSUES AND SKULL: No acute skull fracture. No acute soft tissue abnormality. IMPRESSION: 1. No acute intracranial abnormality. Electronically signed by: Evalene Coho MD 10/18/2024 09:41 AM EST RP Workstation: [id number]     Procedures   Medications Ordered in the ED  lacosamide  (VIMPAT ) 100 mg in sodium chloride  0.9 % 25 mL IVPB (0 mg Intravenous  Stopped 10/18/24 1042)  ondansetron  (ZOFRAN ) injection 4 mg (4 mg Intravenous Given 10/18/24 0844)  sodium chloride  0.9 % bolus 1,000 mL (0 mLs Intravenous Stopped 10/18/24 1042)  prochlorperazine  (COMPAZINE ) injection 5 mg (5 mg Intravenous Given 10/18/24 1046)                                    Medical Decision Making Amount and/or Complexity of Data Reviewed Labs: ordered. Radiology: ordered.  Risk Prescription drug management.   Patient seizure.  History of same.  Noncompliant with medications.  Also drank some alcohol last night.  Will load with some IV Vimpat .  Will get head CT due to head trauma.  Will give antiemetics due to the vomiting.  Basic blood work.  Will continue to monitor.  Reviewed previous neurology note.  Blood work reassuring.  Patient doing better.  Has not tolerated orals after initially Zofran  and then Compazine .  Head CT reassuring.  It appears his last Vimpat  was filled for 30-day supply in August.  Will provide new prescription although he does have refills.  Also had been loaded on 100 mg of Vimpat  here.  Discharged home to follow-up with neurology.       Final diagnoses:  Seizure (HCC)  Noncompliance with medication regimen    ED Discharge Orders          Ordered    Ambulatory referral to Neurology       Comments: An appointment is requested in approximately: 2 weeks   10/18/24 1256    Lacosamide  (VIMPAT ) 100 MG TABS  2 times daily        10/18/24 1257    ondansetron  (ZOFRAN -ODT) 4 MG disintegrating tablet  Every 8 hours PRN        10/18/24 1257               Patsey Lot, MD 10/18/24 1259

## 2024-10-18 NOTE — ED Notes (Signed)
 Since receiving the Compazine  pt has not been up and to the restroom as frequently as before. In room at this time with family at bedside. Has been able to successfully keep down 2 generales so far and is holding down ice as well.

## 2024-10-18 NOTE — ED Triage Notes (Signed)
 Pt arrived [facility] from home with c/o seizure that lasted about 1 -2 minutes. Pt takes his Lamictal every now and then. Pt got out of bed and fell , bit his tongue and witness stated grand mal . Friend gave 10 mg of diazepam  nasally. Pt alert at this time and vomiting.

## 2024-10-18 NOTE — ED Notes (Signed)
 Changed out trash bag in trash can in room as patient has vomited up water - clear.

## 2024-10-21 ENCOUNTER — Telehealth: Payer: Self-pay | Admitting: Neurology

## 2024-10-21 ENCOUNTER — Other Ambulatory Visit: Payer: Self-pay | Admitting: Neurology

## 2024-10-21 MED ORDER — CLOBAZAM 10 MG PO TABS: 10.0000 mg | ORAL_TABLET | Freq: Every evening | ORAL | 5 refills | Status: AC

## 2024-10-21 NOTE — Telephone Encounter (Signed)
 Pt's mother called stating that the pt had a seizure Sat morning and the hospital put him back on Lacosamide  (VIMPAT ) 100 MG TABS which makes him hallucinate. Mother Levorn would like to discuss a medication change that will not give the pt hallucinations. Please advise.

## 2024-10-21 NOTE — Telephone Encounter (Signed)
 Called pt's Mother back and let her know that Dr. Gregg sent in cloBAZam 10 mg to pt's pharmacy and he wants pt to start medication ASAP.  Told pt's mother that Dr. Gregg wanted pt to titrate down off of his Vimpat  100 mg, 1 tablet per day for 1 week and then stop, pt's mother states pt is not taking Vimpat .

## 2024-11-07 ENCOUNTER — Ambulatory Visit: Payer: MEDICAID | Admitting: Neurology

## 2024-11-07 ENCOUNTER — Encounter: Payer: Self-pay | Admitting: Neurology

## 2024-11-07 VITALS — BP 119/82 | HR 87 | Ht 64.0 in | Wt 148.0 lb

## 2024-11-07 DIAGNOSIS — M545 Low back pain, unspecified: Secondary | ICD-10-CM | POA: Diagnosis not present

## 2024-11-07 DIAGNOSIS — G40309 Generalized idiopathic epilepsy and epileptic syndromes, not intractable, without status epilepticus: Secondary | ICD-10-CM | POA: Diagnosis not present

## 2024-11-07 MED ORDER — VALTOCO 20 MG DOSE 2 X 10 MG/0.1ML NA LQPK: 20.0000 mg | NASAL | 3 refills | Status: AC | PRN

## 2024-11-07 NOTE — Patient Instructions (Addendum)
 Start clobazam  10 mg nightly Will refill Valtoco  Please contact me if you do have a breakthrough seizure He is complaining of back pain following his most recent seizure, will obtain x-rays to rule Return as scheduled in March.

## 2024-11-07 NOTE — Progress Notes (Signed)
 "  [facility]  PATIENT: Timothy [name] DOB: 11-27-1995  REQUESTING CLINICIAN: Patsey Lot, MD HISTORY FROM: Patient/Family  REASON FOR VISIT: Seizure    HISTORICAL  CHIEF COMPLAINT:  Chief Complaint  Patient presents with   Follow-up    Rm 13, friend,  follow up. Had sz 10-25-2024 missed doses.    INTERVAL HISTORY 11/07/2024  Patient presents today for follow-up.  Last visit was in August.  At that time, we discontinued the levetiracetam  due to hallucinations and started him on Vimpat .  He tells me he was not taking the Vimpat  and was having breakthrough seizures.  We switched Vimpat  to once a day clobazam  but again he is not taking the medication he did have a seizure on November 29 and again another 1 on December 6.  He did use the Valtoco .  His repeat EEG was completed and was normal.    HISTORY OF PRESENT ILLNESS:  This is a 28 year old gentleman past medical history of alcoholism, marijuana use, seizure disorder who is presenting to establish care.  In brief, he tells me his seizures started since the age of 6 months after having a febrile seizure.  He went on to have seizures sporadically, was never put on medication. In 2020, he had generalized convulsion, and since then his seizure frequency has increased.  He reports generalized convulsion and also staring spells.  But lately he has been only having generalized convulsion.  He did have 2 seizures this year, one on May 24 and the last one on July 4.  He was put on levetiracetam  after the seizure on May 24 but stated that medication gave him hallucinations, therefore he self discontinued.  After his seizure on July 4.  He was put on lacosamide  but he has been taking the medication inconsistently.  Fortunately for patient has not had any seizure or seizure activity.  However he still consume alcohol and marijuana.  Handedness: Right handed   Onset: 6 months, febrile seizure   Seizure Type: Staring spells  ,then has generalized convulsion   Current frequency: 2 seizure in 2025, last one July 4  Any injuries from seizures: Tongue biting, back pain   Seizure risk factors: Father   Previous ASMs: Levetiracetam , has not tried lacosamide   Currenty ASMs: Clobazam  10 mg nightly but has not tried it  ASMs side effects: Hallucination with Levetiracetam , none with Lacosamide   Brain Images: Subdural hematoma   Previous EEGs: diffuse slowing    OTHER MEDICAL CONDITIONS: Alcohol and marijuana use   REVIEW OF SYSTEMS: Full 14 system review of systems performed and negative with exception of: As noted in the HPI   ALLERGIES: Allergies  Allergen Reactions   Keppra  [Levetiracetam ] Other (See Comments)    Hallucinations    Amoxil [Amoxicillin] Rash    HOME MEDICATIONS: Outpatient Medications Prior to Visit  Medication Sig Dispense Refill   chlordiazePOXIDE  (LIBRIUM ) 25 MG capsule 50mg  PO TID x 1D, then 25-50mg  PO BID X 1D, then 25-50mg  PO QD X 1D 10 capsule 0   cloBAZam  (ONFI ) 10 MG tablet Take 1 tablet (10 mg total) by mouth at bedtime. 30 tablet 5   famotidine  (PEPCID ) 20 MG tablet Take 1 tablet (20 mg total) by mouth 2 (two) times daily. 10 tablet 0   ondansetron  (ZOFRAN -ODT) 4 MG disintegrating tablet Take 1 tablet (4 mg total) by mouth every 8 (eight) hours as needed for nausea or vomiting. 8 tablet 0   pantoprazole  (PROTONIX ) 20 MG tablet Take 1 tablet (  20 mg total) by mouth daily. 30 tablet 0   promethazine  (PHENERGAN ) 25 MG tablet Take 1 tablet (25 mg total) by mouth every 6 (six) hours as needed for nausea or vomiting. 30 tablet 0   diazePAM , 20 MG Dose, (VALTOCO  20 MG DOSE) 2 x 10 MG/0.1ML LQPK Place 20 mg into the nose as needed (for seizure). 10 each 0   Lacosamide  (VIMPAT ) 100 MG TABS Take 1 tablet (100 mg total) by mouth in the morning and at bedtime. 60 tablet 5   No facility-administered medications prior to visit.    PAST MEDICAL HISTORY: Past Medical History:  Diagnosis  Date   Cannabinoid hyperemesis syndrome    Febrile seizures (HCC)    last at 48 y of age   Withdrawal seizures (HCC)     PAST SURGICAL HISTORY: History reviewed. No pertinent surgical history.  FAMILY HISTORY: Family History  Problem Relation Age of Onset   Diabetes Maternal Grandmother     SOCIAL HISTORY: Social History   Socioeconomic History   Marital status: Single    Spouse name: Not on file   Number of children: Not on file   Years of education: Not on file   Highest education level: Not on file  Occupational History   Not on file  Tobacco Use   Smoking status: Heavy Smoker    Current packs/day: 0.50    Types: Cigarettes   Smokeless tobacco: Never  Vaping Use   Vaping status: Never Used  Substance and Sexual Activity   Alcohol use: Yes    Comment: 2-3 beers daily   Drug use: Yes    Types: Marijuana    Comment: smokes every day   Sexual activity: Yes    Birth control/protection: Condom  Other Topics Concern   Not on file  Social History Narrative   Right handed   Caffeine- occasionally   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   Social Drivers of Health   Tobacco Use: High Risk (11/07/2024)   Patient History    Smoking Tobacco Use: Heavy Smoker    Smokeless Tobacco Use: Never    Passive Exposure: Not on file  Financial Resource Strain: Not on file  Food Insecurity: No Food Insecurity (04/12/2024)   Hunger Vital Sign    Worried About Running Out of Food in the Last Year: Never true    Ran Out of Food in the Last Year: Never true  Transportation Needs: No Transportation Needs (04/12/2024)   PRAPARE - Administrator, Civil Service (Medical): No    Lack of Transportation (Non-Medical): No  Physical Activity: Not on file  Stress: Not on file  Social Connections: Not on file  Intimate Partner Violence: Unknown (04/12/2024)   Humiliation, Afraid, Rape, and Kick questionnaire    Fear of Current or Ex-Partner: Patient unable to answer     Emotionally Abused: Patient unable to answer    Physically Abused: Not on file    Sexually Abused: Patient unable to answer  Depression (PHQ2-9): High Risk (05/27/2024)   Depression (PHQ2-9)    PHQ-2 Score: 11  Alcohol Screen: Not on file  Housing: Unknown (04/12/2024)   Housing Stability Vital Sign    Unable to Pay for Housing in the Last Year: No    Number of Times Moved in the Last Year: Not on file    Homeless in the Last Year: No  Utilities: Not At Risk (04/12/2024)   AHC Utilities    Threatened with loss of utilities:  No  Health Literacy: Not on file    PHYSICAL EXAM  GENERAL EXAM/CONSTITUTIONAL: Vitals:  Vitals:   11/07/24 0951  BP: 119/82  Pulse: 87  Weight: 148 lb (67.1 kg)  Height: 5' 4 (1.626 m)    Body mass index is 25.4 kg/m. Wt Readings from Last 3 Encounters:  11/07/24 148 lb (67.1 kg)  10/18/24 149 lb 14.6 oz (68 kg)  09/24/24 149 lb 14.6 oz (68 kg)   Patient is in no distress; well developed, nourished and groomed; neck is supple  MUSCULOSKELETAL: Gait, strength, tone, movements noted in Neurologic exam below  NEUROLOGIC: MENTAL STATUS:      No data to display         awake, alert, oriented to person, place and time recent and remote memory intact normal attention and concentration language fluent, comprehension intact, naming intact fund of knowledge appropriate  CRANIAL NERVE:  2nd, 3rd, 4th, 6th - Visual fields full to confrontation, extraocular muscles intact, no nystagmus 5th - facial sensation symmetric 7th - facial strength symmetric 8th - hearing intact 9th - palate elevates symmetrically, uvula midline 11th - shoulder shrug symmetric 12th - tongue protrusion midline  MOTOR:  normal bulk and tone, full strength in the BUE, BLE  SENSORY:  normal and symmetric to light touch  COORDINATION:  finger-nose-finger, fine finger movements normal  GAIT/STATION:  normal     DIAGNOSTIC DATA (LABS, IMAGING, TESTING) - I  reviewed patient records, labs, notes, testing and imaging myself where available.  Lab Results  Component Value Date   WBC 5.1 10/18/2024   HGB 15.8 10/18/2024   HCT 48.2 10/18/2024   MCV 97.2 10/18/2024   PLT 224 10/18/2024      Component Value Date/Time   NA 139 10/18/2024 0817   K 4.1 10/18/2024 0817   CL 103 10/18/2024 0817   CO2 19 (L) 10/18/2024 0817   GLUCOSE 135 (H) 10/18/2024 0817   BUN 7 10/18/2024 0817   CREATININE 0.82 10/18/2024 0817   CALCIUM 9.9 10/18/2024 0817   PROT 7.8 10/18/2024 0817   ALBUMIN 4.8 10/18/2024 0817   AST 98 (H) 10/18/2024 0817   ALT 124 (H) 10/18/2024 0817   ALKPHOS 107 10/18/2024 0817   BILITOT 1.0 10/18/2024 0817   GFRNONAA >60 10/18/2024 0817   GFRAA >60 07/29/2019 1737   No results found for: CHOL, HDL, LDLCALC, LDLDIRECT, TRIG Lab Results  Component Value Date   HGBA1C 4.6 (L) 04/12/2024   No results found for: VITAMINB12 No results found for: TSH   MRI Brain 04/12/2024 1. Thin subdural hematomas over the parietal and occipital lobes bilaterally without significant mass effect. Anterior hemorrhage is no longer visualized. 2. Hemorrhagic contusions along the gyrus rectus bilaterally, right greater than left. 3. Mild diffuse dural enhancement, likely reactive to the subdural and subarachnoid blood. No pathologic parenchymal enhancement.  EEG 04/13/2024 - Continuous slow, generalized  Routine EEG 07/30/2024 Normal     I personally reviewed brain Images and previous EEG reports.   ASSESSMENT AND PLAN  28 y.o. year old male  with history of epilepsy, alcohol and marijuana use who is presenting to for follow-up.  He continued to have breakthrough seizures in the setting of medication nonadherence.  He did have side effects on levetiracetam , did not tried Vimpat , was again prescribed clobazam  but has not tried it.  I spent additional amount of time discussing the need to start clobazam .  We discussed SUDEP, risk of  having drug-resistant epilepsy and again need for  compliance.  He voiced understanding and reports that he will start clobazam  today.  He is reporting acute back pain following his seizures.  Will obtain x-rays.  I will see him as scheduled in March.  Will refill his meds and Valtoco    1. Nonintractable generalized idiopathic epilepsy without status epilepticus (HCC)   2. Acute midline low back pain without sciatica      Patient Instructions  Start clobazam  10 mg nightly Will refill Valtoco  Please contact me if you do have a breakthrough seizure He is complaining of back pain following his most recent seizure, will obtain x-rays to rule Return as scheduled in March.   Per [place] statutes, patients with seizures are not allowed to drive until they have been seizure-free for six months.  Other recommendations include using caution when using heavy equipment or power tools. Avoid working on ladders or at heights. Take showers instead of baths.  Do not swim alone.  Ensure the water temperature is not too high on the home water heater. Do not go swimming alone. Do not lock yourself in a room alone (i.e. bathroom). When caring for infants or small children, sit down when holding, feeding, or changing them to minimize risk of injury to the child in the event you have a seizure. Maintain good sleep hygiene. Avoid alcohol.  Also recommend adequate sleep, hydration, good diet and minimize stress.   During the Seizure  - First, ensure adequate ventilation and place patients on the floor on their left side  Loosen clothing around the neck and ensure the airway is patent. If the patient is clenching the teeth, do not force the mouth open with any object as this can cause severe damage - Remove all items from the surrounding that can be hazardous. The patient may be oblivious to what's happening and may not even know what he or she is doing. If the patient is confused and wandering, either  gently guide him/her away and block access to outside areas - Reassure the individual and be comforting - Call 911. In most cases, the seizure ends before EMS arrives. However, there are cases when seizures may last over 3 to 5 minutes. Or the individual may have developed breathing difficulties or severe injuries. If a pregnant patient or a person with diabetes develops a seizure, it is prudent to call an ambulance. - Finally, if the patient does not regain full consciousness, then call EMS. Most patients will remain confused for about 45 to 90 minutes after a seizure, so you must use judgment in calling for help. - Avoid restraints but make sure the patient is in a bed with padded side rails - Place the individual in a lateral position with the neck slightly flexed; this will help the saliva drain from the mouth and prevent the tongue from falling backward - Remove all nearby furniture and other hazards from the area - Provide verbal assurance as the individual is regaining consciousness - Provide the patient with privacy if possible - Call for help and start treatment as ordered by the caregiver   After the Seizure (Postictal Stage)  After a seizure, most patients experience confusion, fatigue, muscle pain and/or a headache. Thus, one should permit the individual to sleep. For the next few days, reassurance is essential. Being calm and helping reorient the person is also of importance.  Most seizures are painless and end spontaneously. Seizures are not harmful to others but can lead to complications such as stress on the  lungs, brain and the heart. Individuals with prior lung problems may develop labored breathing and respiratory distress.    Discussed Patients with epilepsy have a small risk of sudden unexpected death, a condition referred to as sudden unexpected death in epilepsy (SUDEP). SUDEP is defined specifically as the sudden, unexpected, witnessed or unwitnessed, nontraumatic and  nondrowning death in patients with epilepsy with or without evidence for a seizure, and excluding documented status epilepticus, in which post mortem examination does not reveal a structural or toxicologic cause for death     Orders Placed This Encounter  Procedures   DG Lumbar Spine 2-3 Views   DG Thoracic Spine 2 View    Meds ordered this encounter  Medications   diazePAM , 20 MG Dose, (VALTOCO  20 MG DOSE) 2 x 10 MG/0.1ML LQPK    Sig: Place 20 mg into the nose as needed (for seizure).    Dispense:  10 each    Refill:  3    Please provide a total of 10 doses    No follow-ups on file.  The patient's condition of epilepsy requires frequent monitoring and adjustments in the treatment plan, reflecting the ongoing complexity of care.  This provider is the continuing focal point for all needed services for this condition.   Pastor Falling, MD 11/07/2024, 10:17 AM  [facility] [address], [address] [place], [place] [address] [phone]  "

## 2024-11-25 ENCOUNTER — Ambulatory Visit
Admission: RE | Admit: 2024-11-25 | Discharge: 2024-11-25 | Disposition: A | Discharge status: Home / Self Care | Payer: MEDICAID | Source: Ambulatory Visit | Attending: Neurology | Admitting: Neurology

## 2024-11-25 DIAGNOSIS — M545 Low back pain, unspecified: Secondary | ICD-10-CM

## 2024-12-02 ENCOUNTER — Telehealth: Payer: Self-pay | Admitting: Neurology

## 2024-12-02 NOTE — Telephone Encounter (Signed)
 Patient's mother Jud Fanguy called to check on status of xray results.

## 2024-12-09 ENCOUNTER — Ambulatory Visit: Payer: Self-pay | Admitting: Neurology

## 2024-12-09 NOTE — Telephone Encounter (Signed)
 Pt's mother is asking for a call with results

## 2024-12-09 NOTE — Progress Notes (Signed)
 Kindly inform the patient that x-rays of thoracic spine showed minimum compression of 12 spine between the 6 and 8 thoracic vertebrae which is new since 2019.  This may be related to fall or injury.  It does not appear to be new

## 2024-12-09 NOTE — Progress Notes (Signed)
 Kindly inform the patient that x-ray of the show slight curvature but no major problems to worry about

## 2024-12-10 NOTE — Telephone Encounter (Signed)
 Pt's mother called again wanting to know the results. I informed mother of what provider noted, ok per DPR, but let her know that I would have the nurse call her back to explain what the results mean. Please advise.

## 2025-02-09 ENCOUNTER — Ambulatory Visit: Admitting: Neurology
